# Patient Record
Sex: Male | Born: 1935
Health system: Southern US, Community
[De-identification: ages and names within clinical notes are randomized; demographics above are authoritative.]

## PROBLEM LIST (undated history)

## (undated) DIAGNOSIS — S065XAA Traumatic subdural hemorrhage with loss of consciousness status unknown, initial encounter: Secondary | ICD-10-CM

## (undated) DIAGNOSIS — I272 Pulmonary hypertension, unspecified: Secondary | ICD-10-CM

## (undated) DIAGNOSIS — E119 Type 2 diabetes mellitus without complications: Secondary | ICD-10-CM

## (undated) DIAGNOSIS — I2699 Other pulmonary embolism without acute cor pulmonale: Secondary | ICD-10-CM

## (undated) DIAGNOSIS — I2510 Atherosclerotic heart disease of native coronary artery without angina pectoris: Secondary | ICD-10-CM

## (undated) DIAGNOSIS — I639 Cerebral infarction, unspecified: Secondary | ICD-10-CM

## (undated) DIAGNOSIS — S065X9A Traumatic subdural hemorrhage with loss of consciousness of unspecified duration, initial encounter: Secondary | ICD-10-CM

## (undated) DIAGNOSIS — G4733 Obstructive sleep apnea (adult) (pediatric): Secondary | ICD-10-CM

## (undated) DIAGNOSIS — Z950 Presence of cardiac pacemaker: Secondary | ICD-10-CM

## (undated) DIAGNOSIS — G9001 Carotid sinus syncope: Secondary | ICD-10-CM

## (undated) DIAGNOSIS — I10 Essential (primary) hypertension: Secondary | ICD-10-CM

## (undated) DIAGNOSIS — E782 Mixed hyperlipidemia: Secondary | ICD-10-CM

## (undated) HISTORY — DX: Atherosclerotic heart disease of native coronary artery without angina pectoris: I25.10

## (undated) HISTORY — DX: Traumatic subdural hemorrhage with loss of consciousness of unspecified duration, initial encounter: S06.5X9A

## (undated) HISTORY — DX: Pulmonary hypertension, unspecified: I27.20

## (undated) HISTORY — DX: Essential (primary) hypertension: I10

## (undated) HISTORY — DX: Type 2 diabetes mellitus without complications: E11.9

## (undated) HISTORY — DX: Other pulmonary embolism without acute cor pulmonale: I26.99

## (undated) HISTORY — DX: Traumatic subdural hemorrhage with loss of consciousness status unknown, initial encounter: S06.5XAA

## (undated) HISTORY — DX: Carotid sinus syncope: G90.01

## (undated) HISTORY — DX: Obstructive sleep apnea (adult) (pediatric): G47.33

## (undated) HISTORY — PX: OTHER SURGICAL HISTORY: SHX169

## (undated) HISTORY — DX: Mixed hyperlipidemia: E78.2

---

## 1984-11-19 DIAGNOSIS — Z950 Presence of cardiac pacemaker: Secondary | ICD-10-CM

## 1984-11-19 HISTORY — DX: Presence of cardiac pacemaker: Z95.0

## 2001-04-29 ENCOUNTER — Ambulatory Visit (HOSPITAL_COMMUNITY): Admission: RE | Admit: 2001-04-29 | Discharge: 2001-04-29 | Payer: Self-pay | Admitting: Family Medicine

## 2001-04-29 ENCOUNTER — Encounter: Payer: Self-pay | Admitting: Family Medicine

## 2001-05-23 ENCOUNTER — Ambulatory Visit (HOSPITAL_COMMUNITY): Admission: RE | Admit: 2001-05-23 | Discharge: 2001-05-23 | Payer: Self-pay | Admitting: Family Medicine

## 2003-07-11 ENCOUNTER — Emergency Department (HOSPITAL_COMMUNITY): Admission: EM | Admit: 2003-07-11 | Discharge: 2003-07-11 | Payer: Self-pay | Admitting: *Deleted

## 2003-07-11 ENCOUNTER — Encounter: Payer: Self-pay | Admitting: *Deleted

## 2003-07-19 ENCOUNTER — Ambulatory Visit (HOSPITAL_COMMUNITY): Admission: RE | Admit: 2003-07-19 | Discharge: 2003-07-19 | Payer: Self-pay | Admitting: Internal Medicine

## 2003-07-20 ENCOUNTER — Ambulatory Visit (HOSPITAL_COMMUNITY): Admission: RE | Admit: 2003-07-20 | Discharge: 2003-07-20 | Payer: Self-pay | Admitting: Internal Medicine

## 2003-07-20 ENCOUNTER — Encounter: Payer: Self-pay | Admitting: Internal Medicine

## 2003-08-17 ENCOUNTER — Ambulatory Visit (HOSPITAL_COMMUNITY): Admission: RE | Admit: 2003-08-17 | Discharge: 2003-08-17 | Payer: Self-pay | Admitting: Internal Medicine

## 2004-04-30 ENCOUNTER — Emergency Department (HOSPITAL_COMMUNITY): Admission: EM | Admit: 2004-04-30 | Discharge: 2004-05-01 | Payer: Self-pay | Admitting: Emergency Medicine

## 2005-03-30 ENCOUNTER — Ambulatory Visit: Payer: Self-pay | Admitting: Cardiology

## 2005-03-30 ENCOUNTER — Inpatient Hospital Stay (HOSPITAL_COMMUNITY): Admission: AD | Admit: 2005-03-30 | Discharge: 2005-04-03 | Payer: Self-pay | Admitting: Internal Medicine

## 2005-04-03 ENCOUNTER — Ambulatory Visit: Payer: Self-pay | Admitting: Cardiology

## 2005-04-18 ENCOUNTER — Ambulatory Visit: Payer: Self-pay | Admitting: Cardiology

## 2005-06-26 ENCOUNTER — Ambulatory Visit: Payer: Self-pay | Admitting: Cardiology

## 2005-06-27 ENCOUNTER — Inpatient Hospital Stay (HOSPITAL_COMMUNITY): Admission: AD | Admit: 2005-06-27 | Discharge: 2005-06-28 | Payer: Self-pay | Admitting: Cardiology

## 2005-06-28 ENCOUNTER — Ambulatory Visit: Payer: Self-pay | Admitting: Cardiology

## 2005-07-11 ENCOUNTER — Ambulatory Visit: Payer: Self-pay | Admitting: Cardiology

## 2005-07-11 ENCOUNTER — Ambulatory Visit: Payer: Self-pay | Admitting: Internal Medicine

## 2005-07-16 ENCOUNTER — Inpatient Hospital Stay (HOSPITAL_COMMUNITY): Admission: RE | Admit: 2005-07-16 | Discharge: 2005-07-17 | Payer: Self-pay | Admitting: Internal Medicine

## 2005-07-16 HISTORY — PX: OTHER SURGICAL HISTORY: SHX169

## 2005-07-25 ENCOUNTER — Ambulatory Visit: Payer: Self-pay

## 2005-08-11 ENCOUNTER — Emergency Department (HOSPITAL_COMMUNITY): Admission: EM | Admit: 2005-08-11 | Discharge: 2005-08-12 | Payer: Self-pay | Admitting: *Deleted

## 2005-08-15 ENCOUNTER — Ambulatory Visit: Payer: Self-pay | Admitting: Cardiology

## 2005-08-17 ENCOUNTER — Ambulatory Visit: Payer: Self-pay | Admitting: Cardiology

## 2005-09-06 ENCOUNTER — Ambulatory Visit: Payer: Self-pay | Admitting: Cardiology

## 2005-09-25 ENCOUNTER — Ambulatory Visit: Payer: Self-pay | Admitting: Internal Medicine

## 2005-12-04 ENCOUNTER — Ambulatory Visit: Payer: Self-pay | Admitting: Cardiology

## 2006-01-09 ENCOUNTER — Ambulatory Visit: Payer: Self-pay | Admitting: Cardiology

## 2006-04-17 ENCOUNTER — Ambulatory Visit: Payer: Self-pay | Admitting: Cardiology

## 2006-09-26 ENCOUNTER — Ambulatory Visit: Payer: Self-pay | Admitting: Cardiology

## 2006-10-29 ENCOUNTER — Ambulatory Visit: Payer: Self-pay | Admitting: Internal Medicine

## 2007-04-22 ENCOUNTER — Ambulatory Visit: Payer: Self-pay | Admitting: Internal Medicine

## 2007-07-09 ENCOUNTER — Ambulatory Visit: Payer: Self-pay | Admitting: Cardiology

## 2007-07-22 ENCOUNTER — Ambulatory Visit: Payer: Self-pay | Admitting: Internal Medicine

## 2007-10-22 ENCOUNTER — Ambulatory Visit: Payer: Self-pay | Admitting: Internal Medicine

## 2008-01-21 ENCOUNTER — Ambulatory Visit: Payer: Self-pay | Admitting: Internal Medicine

## 2008-03-26 ENCOUNTER — Ambulatory Visit: Payer: Self-pay | Admitting: Internal Medicine

## 2008-06-29 ENCOUNTER — Ambulatory Visit: Payer: Self-pay | Admitting: Cardiology

## 2008-06-30 ENCOUNTER — Encounter: Payer: Self-pay | Admitting: Cardiology

## 2008-07-16 ENCOUNTER — Encounter: Payer: Self-pay | Admitting: Physician Assistant

## 2008-07-16 ENCOUNTER — Ambulatory Visit: Payer: Self-pay | Admitting: Cardiology

## 2008-07-18 ENCOUNTER — Encounter: Payer: Self-pay | Admitting: Cardiology

## 2008-07-19 ENCOUNTER — Encounter: Payer: Self-pay | Admitting: Cardiology

## 2008-07-20 ENCOUNTER — Ambulatory Visit: Payer: Self-pay | Admitting: Cardiology

## 2008-07-21 ENCOUNTER — Encounter: Payer: Self-pay | Admitting: Cardiology

## 2008-07-30 ENCOUNTER — Ambulatory Visit: Payer: Self-pay | Admitting: Internal Medicine

## 2008-08-18 ENCOUNTER — Ambulatory Visit: Payer: Self-pay | Admitting: Cardiology

## 2008-10-28 ENCOUNTER — Ambulatory Visit: Payer: Self-pay | Admitting: Internal Medicine

## 2009-01-26 ENCOUNTER — Ambulatory Visit: Payer: Self-pay | Admitting: Internal Medicine

## 2009-03-12 ENCOUNTER — Encounter (INDEPENDENT_AMBULATORY_CARE_PROVIDER_SITE_OTHER): Payer: Self-pay | Admitting: *Deleted

## 2009-03-31 ENCOUNTER — Ambulatory Visit: Payer: Self-pay | Admitting: Cardiology

## 2009-05-13 ENCOUNTER — Encounter: Payer: Self-pay | Admitting: Physician Assistant

## 2009-05-13 ENCOUNTER — Ambulatory Visit: Payer: Self-pay | Admitting: Internal Medicine

## 2009-05-13 ENCOUNTER — Inpatient Hospital Stay (HOSPITAL_COMMUNITY): Admission: AD | Admit: 2009-05-13 | Discharge: 2009-05-16 | Payer: Self-pay | Admitting: Cardiology

## 2009-05-13 ENCOUNTER — Encounter: Payer: Self-pay | Admitting: Internal Medicine

## 2009-05-14 ENCOUNTER — Encounter: Payer: Self-pay | Admitting: Cardiology

## 2009-05-15 ENCOUNTER — Encounter: Payer: Self-pay | Admitting: Cardiology

## 2009-05-16 ENCOUNTER — Encounter: Payer: Self-pay | Admitting: Cardiology

## 2009-06-01 ENCOUNTER — Ambulatory Visit: Payer: Self-pay | Admitting: Cardiology

## 2009-06-01 ENCOUNTER — Encounter: Payer: Self-pay | Admitting: Physician Assistant

## 2009-06-14 ENCOUNTER — Telehealth (INDEPENDENT_AMBULATORY_CARE_PROVIDER_SITE_OTHER): Payer: Self-pay | Admitting: Radiology

## 2009-06-15 ENCOUNTER — Encounter: Payer: Self-pay | Admitting: Cardiology

## 2009-06-15 ENCOUNTER — Ambulatory Visit: Payer: Self-pay

## 2009-06-15 ENCOUNTER — Encounter: Payer: Self-pay | Admitting: Cardiovascular Disease

## 2009-06-15 DIAGNOSIS — I700 Atherosclerosis of aorta: Secondary | ICD-10-CM | POA: Insufficient documentation

## 2009-07-04 DIAGNOSIS — I714 Abdominal aortic aneurysm, without rupture, unspecified: Secondary | ICD-10-CM | POA: Insufficient documentation

## 2009-07-04 DIAGNOSIS — I10 Essential (primary) hypertension: Secondary | ICD-10-CM | POA: Insufficient documentation

## 2009-07-04 DIAGNOSIS — R079 Chest pain, unspecified: Secondary | ICD-10-CM | POA: Insufficient documentation

## 2009-07-04 DIAGNOSIS — G9001 Carotid sinus syncope: Secondary | ICD-10-CM | POA: Insufficient documentation

## 2009-07-04 DIAGNOSIS — R0602 Shortness of breath: Secondary | ICD-10-CM | POA: Insufficient documentation

## 2009-07-04 DIAGNOSIS — E785 Hyperlipidemia, unspecified: Secondary | ICD-10-CM | POA: Insufficient documentation

## 2009-07-05 ENCOUNTER — Ambulatory Visit: Payer: Self-pay | Admitting: Cardiology

## 2009-07-05 ENCOUNTER — Encounter: Payer: Self-pay | Admitting: Cardiology

## 2009-07-05 DIAGNOSIS — M549 Dorsalgia, unspecified: Secondary | ICD-10-CM | POA: Insufficient documentation

## 2009-07-05 DIAGNOSIS — G2581 Restless legs syndrome: Secondary | ICD-10-CM | POA: Insufficient documentation

## 2009-07-05 DIAGNOSIS — G4733 Obstructive sleep apnea (adult) (pediatric): Secondary | ICD-10-CM | POA: Insufficient documentation

## 2009-07-12 ENCOUNTER — Telehealth: Payer: Self-pay | Admitting: Cardiology

## 2009-07-14 ENCOUNTER — Encounter: Payer: Self-pay | Admitting: Cardiology

## 2009-08-10 ENCOUNTER — Ambulatory Visit: Payer: Self-pay | Admitting: Internal Medicine

## 2009-09-08 ENCOUNTER — Encounter: Payer: Self-pay | Admitting: Internal Medicine

## 2009-11-14 ENCOUNTER — Encounter: Payer: Self-pay | Admitting: Internal Medicine

## 2009-11-15 ENCOUNTER — Ambulatory Visit: Payer: Self-pay | Admitting: Internal Medicine

## 2009-11-19 HISTORY — PX: CHOLECYSTECTOMY: SHX55

## 2009-11-24 ENCOUNTER — Encounter: Payer: Self-pay | Admitting: Internal Medicine

## 2010-01-06 ENCOUNTER — Encounter: Payer: Self-pay | Admitting: Cardiology

## 2010-01-08 ENCOUNTER — Encounter: Payer: Self-pay | Admitting: Cardiology

## 2010-01-09 ENCOUNTER — Encounter: Payer: Self-pay | Admitting: Cardiology

## 2010-01-10 ENCOUNTER — Encounter: Payer: Self-pay | Admitting: Cardiology

## 2010-01-12 ENCOUNTER — Encounter: Payer: Self-pay | Admitting: Cardiology

## 2010-01-24 ENCOUNTER — Ambulatory Visit: Payer: Self-pay | Admitting: Cardiology

## 2010-01-24 DIAGNOSIS — I2510 Atherosclerotic heart disease of native coronary artery without angina pectoris: Secondary | ICD-10-CM | POA: Insufficient documentation

## 2010-02-15 ENCOUNTER — Ambulatory Visit: Payer: Self-pay | Admitting: Internal Medicine

## 2010-02-20 ENCOUNTER — Encounter: Payer: Self-pay | Admitting: Internal Medicine

## 2010-05-23 ENCOUNTER — Ambulatory Visit: Payer: Self-pay | Admitting: Internal Medicine

## 2010-07-12 ENCOUNTER — Telehealth: Payer: Self-pay | Admitting: Internal Medicine

## 2010-08-24 ENCOUNTER — Ambulatory Visit: Payer: Self-pay | Admitting: Internal Medicine

## 2010-09-08 ENCOUNTER — Ambulatory Visit: Payer: Self-pay | Admitting: Cardiology

## 2010-09-08 DIAGNOSIS — H8110 Benign paroxysmal vertigo, unspecified ear: Secondary | ICD-10-CM | POA: Insufficient documentation

## 2010-09-20 ENCOUNTER — Encounter: Payer: Self-pay | Admitting: Cardiology

## 2010-11-30 ENCOUNTER — Encounter: Payer: Self-pay | Admitting: Internal Medicine

## 2010-11-30 ENCOUNTER — Ambulatory Visit
Admission: RE | Admit: 2010-11-30 | Discharge: 2010-11-30 | Payer: Self-pay | Source: Home / Self Care | Attending: Internal Medicine | Admitting: Internal Medicine

## 2010-12-21 NOTE — Miscellaneous (Signed)
Summary: Rehab Report/ FAXED OUTPATIENT REHAB  Rehab Report/ FAXED OUTPATIENT REHAB   Imported By: Dorise Hiss 10/03/2010 12:12:29  _____________________________________________________________________  External Attachment:    Type:   Image     Comment:   External Document

## 2010-12-21 NOTE — Assessment & Plan Note (Signed)
Summary: 6 MO FU PER FEB REMINDER-SRS   Visit Type:  Follow-up Primary Provider:  Hasanaj  CC:  follow-up visit.  History of Present Illness: the patient is a 76 year old male with a history of coronary artery disease. The patient has a patent circumflex stent and otherwise nonobstructive coronary disease and recent catheterization. He has normal LV function he also has normal intracardiac pressures arguing against diastolic heart failure. The patient has chronic dyspnea secondary to deconditioning and weight gain. The patient also sees her sick sinus syndrome status post pacemaker implantation. Has no recurrent syncope.  He was recently hospitalized with an ileus. This eventually improved with Reglan. He currently reports no constipation.  Patient denies any chest pain orthopnea PND he has no palpitations or syncope.  The patient has been screened in the past and I will ultrasound and has no evidence of abdominal aortic aneurysm.  Clinical Review Panels:  CXR CXR results Cardiomediastinal silhouette is stable.  Dual lead         cardiac pacemaker in place again noted.  No acute infiltrate or         edema.  No pleural effusion.  Degenerative changes of the thoracic         spine.  Mild hyperinflation again noted.                   IMPRESSION:         No significant change.  Dual lead cardiac pacemaker in place.  Mild         hyperinflation.  No acute disease. (07/16/2008)  Carotid Studies Carotid Doppler Results No significant atherosclerotic plaque in the carotid vessels.         There is no significant stenosis. (06/30/2008)  Cardiac Imaging Cardiac Cath Findings  1. Coronary artery disease with patent left circumflex stent and       nonobstructive coronary artery disease elsewhere.   2. Normal left ventricular function.      PLAN/DISCUSSION:  Based on his catheterization, his chest pain seems   noncardiac.  If it persist, I would consider a possible GI workup as an   outpatient.  He does have a moderate stenosis in the mid LAD, though I   think this is nonobstructive.  However, with persistent chest pain may   consider a Myoview to further evaluate.      Of note on panning down of his abdominal aorta after the left   ventriculogram, did not appear to have some plaquing down there and a   possible small abdominal aortic aneurysm and may consider a possible CT   scan of the abdomen as an outpatient to further evaluate as necessary by   Dr. Andee Lineman.   Bevelyn Buckles. Bensimhon, MD  (05/16/2009)    Preventive Screening-Counseling & Management  Alcohol-Tobacco     Smoking Status: never  Current Medications (verified): 1)  Pantoprazole Sodium 40 Mg Tbec (Pantoprazole Sodium) .... Once Daily 2)  Allopurinol 300 Mg Tabs (Allopurinol) .... Take 1 Tablet By Mouth Once A Day 3)  Lasix 20 Mg Tabs (Furosemide) .... Once Daily As Needed 4)  Aspirin 81 Mg Tbec (Aspirin) .... Take One Tablet By Mouth Daily 5)  Metoprolol Tartrate 50 Mg Tabs (Metoprolol Tartrate) .... Take One Tablet By Mouth Once A Day 6)  Metoclopramide Hcl 5 Mg Tabs (Metoclopramide Hcl) .... Take One By Mouth Before Meals  Allergies (verified): 1)  ! Nitroglycerin  Comments:  Nurse/Medical Assistant: The patient's medications and allergies were reviewed  with the patient and were updated in the Medication and Allergy Lists. List reviewed.  Past History:  Family History: Last updated: 07/05/2009 noncontributory  Social History: Last updated: 07/05/2009 patient denies tobacco use  Risk Factors: Smoking Status: never (01/24/2010)  Past Medical History: CAROTID SINUS SYNDROME (ICD-337.01) HYPERLIPIDEMIA-MIXED (ICD-272.4) HYPERTENSION, UNSPECIFIED (ICD-401.9) DYSPNEA (ICD-786.05) CHEST PAIN-UNSPECIFIED (ICD-786.50) ATHEROSCLEROSIS OF AORTA (ICD-440.0) Type 2 diabetes mellitus.  Obstructive sleep apnea  Social History: Smoking Status:  never  Review of Systems       The patient  complains of dizziness.  The patient denies fatigue, malaise, fever, weight gain/loss, vision loss, decreased hearing, hoarseness, chest pain, palpitations, shortness of breath, prolonged cough, wheezing, sleep apnea, coughing up blood, abdominal pain, blood in stool, nausea, vomiting, diarrhea, heartburn, incontinence, blood in urine, muscle weakness, joint pain, leg swelling, rash, skin lesions, headache, fainting, depression, anxiety, enlarged lymph nodes, easy bruising or bleeding, and environmental allergies.    Vital Signs:  Patient profile:   76 year old male Height:      73 inches Weight:      284 pounds O2 Sat:      96 % Pulse rate:   83 / minute BP sitting:   114 / 76  (left arm) Cuff size:   large  Vitals Entered By: Carlye Grippe (January 24, 2010 9:24 AM) CC: follow-up visit   Physical Exam  Additional Exam:  General: Well-developed, well-nourished in no distress head: Normocephalic and atraumatic eyes PERRLA/EOMI intact, conjunctiva and lids normal nose: No deformity or lesions mouth normal dentition, normal posterior pharynx neck: Supple, no JVD.  No masses, thyromegaly or abnormal cervical nodes lungs: Normal breath sounds bilaterally without wheezing.  Normal percussion heart: regular rate and rhythm with normal S1 and S2, no S3 or S4.  PMI is normal.  No pathological murmurs abdomen: Normal bowel sounds, abdomen is soft and nontender without masses, organomegaly or hernias noted.  No hepatosplenomegaly musculoskeletal: Back normal, normal gait muscle strength and tone normal pulsus: Pulse is normal in all 4 extremities Extremities: No peripheral pitting edema neurologic: Alert and oriented x 3 skin: Intact without lesions or rashes cervical nodes: No significant adenopathy psychologic: Normal affect    PPM Specifications Following MD:  Sherryl Manges, MD     PPM Vendor:  [employer]     PPM Model Number:  [id number]     PPM Serial Number:  [id number] PPM DOI:   07/16/2005     PPM Implanting MD:  Sherryl Manges, MD  Lead 1    Location: [facility]     DOI: 07/16/2005     Model #: [id number]     Serial #: [id number]     Status: active Lead 2    Location: [facility]     DOI: 07/16/2005     Model #: [id number]     Serial #: [id number]     Status: active   Indications:  Syncope    PPM Follow Up Pacer Dependent:  No      Parameters Mode:  DDD     Lower Rate Limit:  60     Upper Rate Limit:  130 Paced AV Delay:  310     Sensed AV Delay:  200  Impression & Recommendations:  Problem # 1:  ABDOMINAL AORTIC ANEURYSM (ICD-441.4) the patient has been ruled out her abdominal aortic aneurysm.  Problem # 2:  CAROTID SINUS SYNDROME (ICD-337.01) the patient has no recurrent syncope. He status post pacemaker implantation. The following medications were removed from  the medication list:    Nitroglycerin 0.4 Mg Subl (Nitroglycerin) ..... One tablet under tongue every 5 minutes as needed for chest pain---may repeat times three His updated medication list for this problem includes:    Aspirin 81 Mg Tbec (Aspirin) .Marland Kitchen... Take one tablet by mouth daily    Metoprolol Tartrate 50 Mg Tabs (Metoprolol tartrate) .Marland Kitchen... Take one tablet by mouth once a day  Problem # 3:  HYPERLIPIDEMIA-MIXED (ICD-272.4) Assessment: Comment Only  Problem # 4:  SLEEP APNEA, OBSTRUCTIVE (ICD-327.23) Assessment: Comment Only  Problem # 5:  CORONARY ARTERY DISEASE, S/P PTCA (ICD-414.9) the patient is status post prior stent placement. He has no recurrent chest pain. Continue secondary prevention. The following medications were removed from the medication list:    Nitroglycerin 0.4 Mg Subl (Nitroglycerin) ..... One tablet under tongue every 5 minutes as needed for chest pain---may repeat times three His updated medication list for this problem includes:    Aspirin 81 Mg Tbec (Aspirin) .Marland Kitchen... Take one tablet by mouth daily    Metoprolol Tartrate 50 Mg Tabs (Metoprolol tartrate) .Marland Kitchen... Take one tablet by mouth once a  day  Other Orders: EKG w/ Interpretation (93000)  Patient Instructions: 1)  Your physician recommends that you continue on your current medications as directed. Please refer to the Current Medication list given to you today. 2)  Follow up in  6 months.

## 2010-12-21 NOTE — Assessment & Plan Note (Signed)
Summary: 6 mo fu per sept reminder   Visit Type:  Follow-up Primary Jamyria Ozanich:  Hasanaj   History of Present Illness: the patient is a 76 year old male with a history of coronary artery disease. The patient has a patent circumflex stent and otherwise nonobstructive coronary disease and recent catheterization. He has normal LV function he also has normal intracardiac pressures arguing against diastolic heart failure. The patient has chronic dyspnea secondary to deconditioning and weight gain. The patient also has r sick sinus syndrome status post pacemaker implantation.  Patient complains off marked dizziness particularly vertigo. He states when he is standing and he moves  his head backwards, the room is spinning around him. Also has noticed when turning over in bed sometimes the room is spinning also. Sometimes even just turning his head to one side will cause a sensation of vertigo. He denies however any presyncope or syncope.    Preventive Screening-Counseling & Management  Alcohol-Tobacco     Smoking Status: never  Current Medications (verified): 1)  Prilosec 40 Mg Cpdr (Omeprazole) .... Take One Capsule Daily 2)  Allopurinol 300 Mg Tabs (Allopurinol) .... Take 1 Tablet By Mouth Once A Day 3)  Lasix 20 Mg Tabs (Furosemide) .... Once Daily As Needed 4)  Aspirin 81 Mg Tbec (Aspirin) .... Take One Tablet By Mouth Daily 5)  Metoprolol Tartrate 50 Mg Tabs (Metoprolol Tartrate) .... Take One Tablet By Mouth Once A Day 6)  Onglyza 5 Mg Tabs (Saxagliptin Hcl) .... Take 1 Tablet By Mouth Once A Day 7)  Probenecid 500 Mg Tabs (Probenecid) .... Take One Tablet Two Times A Day 8)  Fish Oil 1000 Mg Caps (Omega-3 Fatty Acids) .... Take 3 Capsules Daily 9)  Tricor 145 Mg Tabs (Fenofibrate) .... Take 1 Tablet By Mouth Once A Day 10)  Meclizine Hcl 25 Mg Tabs (Meclizine Hcl) .... Take 1 Tablet By Mouth Three Times A Day As Needed Dizziness/vertigo  Allergies (verified): 1)  !  Nitroglycerin  Comments:  Nurse/Medical Assistant: The patient's medication list and allergies were reviewed with the patient and were updated in the Medication and Allergy Lists.  Past History:  Past Medical History: Last updated: 01/24/2010 CAROTID SINUS SYNDROME (ICD-337.01) HYPERLIPIDEMIA-MIXED (ICD-272.4) HYPERTENSION, UNSPECIFIED (ICD-401.9) DYSPNEA (ICD-786.05) CHEST PAIN-UNSPECIFIED (ICD-786.50) ATHEROSCLEROSIS OF AORTA (ICD-440.0) Type 2 diabetes mellitus.  Obstructive sleep apnea  Family History: Last updated: 07/05/2009 noncontributory  Social History: Last updated: 07/05/2009 patient denies tobacco use  Risk Factors: Smoking Status: never (09/08/2010)  Review of Systems       The patient complains of weight gain/loss, vision loss, and dizziness.  The patient denies fatigue, malaise, fever, decreased hearing, hoarseness, chest pain, palpitations, shortness of breath, prolonged cough, wheezing, sleep apnea, coughing up blood, abdominal pain, blood in stool, nausea, vomiting, diarrhea, heartburn, incontinence, blood in urine, muscle weakness, joint pain, leg swelling, rash, skin lesions, headache, fainting, depression, anxiety, enlarged lymph nodes, easy bruising or bleeding, and environmental allergies.    Vital Signs:  Patient profile:   76 year old male Height:      73 inches Weight:      284 pounds Pulse rate:   60 / minute BP sitting:   108 / 70  (left arm) Cuff size:   regular  Vitals Entered By: Carlye Grippe (September 08, 2010 10:50 AM)  Physical Exam  Additional Exam:  General: Well-developed, well-nourished in no distress head: Normocephalic and atraumatic eyes PERRLA/EOMI intact, conjunctiva and lids normal nose: No deformity or lesions mouth normal dentition, normal posterior  pharynx neck: Supple, no JVD.  No masses, thyromegaly or abnormal cervical nodes lungs: Normal breath sounds bilaterally without wheezing.  Normal percussion heart:  regular rate and rhythm with normal S1 and S2, no S3 or S4.  PMI is normal.  No pathological murmurs abdomen: Normal bowel sounds, abdomen is soft and nontender without masses, organomegaly or hernias noted.  No hepatosplenomegaly musculoskeletal: Back normal, normal gait muscle strength and tone normal pulsus: Pulse is normal in all 4 extremities Extremities: No peripheral pitting edema neurologic: Alert and oriented x 3, positive Hallpike Dix maneuver in the office. With rotary nystagmus skin: Intact without lesions or rashes cervical nodes: No significant adenopathy psychologic: Normal affect    PPM Specifications Following MD:  Sherryl Manges, MD     PPM Vendor:  [employer]     PPM Model Number:  [id number]     PPM Serial Number:  [id number] PPM DOI:  07/16/2005     PPM Implanting MD:  Sherryl Manges, MD  Lead 1    Location: [facility]     DOI: 07/16/2005     Model #: [id number]     Serial #: [id number]     Status: active Lead 2    Location: [facility]     DOI: 07/16/2005     Model #: [id number]     Serial #: [id number]     Status: active   Indications:  Syncope    PPM Follow Up Pacer Dependent:  No      Parameters Mode:  DDD     Lower Rate Limit:  60     Upper Rate Limit:  130 Paced AV Delay:  310     Sensed AV Delay:  200  Impression & Recommendations:  Problem # 1:  PACEMAKER DDD- MDT (ICD-V45.01) normal pacemaker function  Problem # 2:  CORONARY ARTERY DISEASE, S/P PTCA (ICD-414.9) no recurrent  chest pain. No indication for ischemia workup His updated medication list for this problem includes:    Aspirin 81 Mg Tbec (Aspirin) .Marland Kitchen... Take one tablet by mouth daily    Metoprolol Tartrate 50 Mg Tabs (Metoprolol tartrate) .Marland Kitchen... Take one tablet by mouth once a day  Problem # 3:  BENIGN POSITIONAL VERTIGO (ICD-386.11) the patient appears to have benign positional vertigo with a positive Hallpike Dix maneuver. The patient will be referred to Margretta Ditty to learn repositioningtechniques. I have prescribed in   the meanwhile p.r.n. meclozine  Patient Instructions: 1)  Referral to Margretta Ditty for BPV in [place] 2)  Meclizine 25mg  three times a day as needed dizziness/vertigo 3)  Follow up in  6 months Prescriptions: MECLIZINE HCL 25 MG TABS (MECLIZINE HCL) Take 1 tablet by mouth three times a day as needed dizziness/vertigo  #30 x 1   Entered by:   Hoover Brunette, LPN   Authorized by:   Lewayne Bunting, MD, [facility]   Signed by:   Hoover Brunette, LPN on 13/24/4010   Method used:   Electronically to        [facility]  [address] [address] [facility] (retail)       [address]       [place], [place]  [address]       Ph: [phone] or [phone]       Fax: [phone]   RxID:   [id number]

## 2010-12-21 NOTE — Assessment & Plan Note (Signed)
Summary: 1 year rov   Primary Provider:  Hasanaj  CC:  1 year follow up. pt reports weakness in his calves and that he is just not able to keep up like he used too.  Marland Kitchen  History of Present Illness:  [name] is seen in followup for a pacemaker implanted for what were thought to be neurally-mediated pauses.  He has had no recurrent syncope.  his history of coronary artery disease with prior patent circumflex stent and otherwise nonobstructive coronary disease at catheterization June 2010.Marland Kitchen He has normal LV function;  a month later he underwent Myoview scanning which was read as normal.  Over the last 3 months he has had problems with significant worsening of exercise tolerance. This is described as being associated with leg weakness and profound diaphoresis. It is relieved by rest. It is unassociated with dizziness. He tells me that he has had an evaluation of lower extremity perfusion.   Current Medications (verified): 1)  Prilosec 40 Mg Cpdr (Omeprazole) .... Take One Capsule Daily 2)  Allopurinol 300 Mg Tabs (Allopurinol) .... Take 1 Tablet By Mouth Once A Day 3)  Lasix 20 Mg Tabs (Furosemide) .... Once Daily As Needed 4)  Aspirin 81 Mg Tbec (Aspirin) .... Take One Tablet By Mouth Daily 5)  Metoprolol Tartrate 50 Mg Tabs (Metoprolol Tartrate) .... Take One Tablet By Mouth Once A Day 6)  Metoclopramide Hcl 5 Mg Tabs (Metoclopramide Hcl) .... Take One By Mouth Before Meals Up To 4 Times Daily 7)  Actos 30 Mg Tabs (Pioglitazone Hcl) .... Take One Tablet Once Daily 8)  Pantoprazole Sodium 40 Mg Tbec (Pantoprazole Sodium) .... Take One Capsule Once Daily 9)  Probenecid 500 Mg Tabs (Probenecid) .... Take One Tablet Two Times A Day 10)  Fish Oil 1000 Mg Caps (Omega-3 Fatty Acids) .... Take 3 Capsules Daily  Allergies (verified): 1)  ! Nitroglycerin  Past History:  Past Medical History: Last updated: 01/24/2010 CAROTID SINUS SYNDROME (ICD-337.01) HYPERLIPIDEMIA-MIXED  (ICD-272.4) HYPERTENSION, UNSPECIFIED (ICD-401.9) DYSPNEA (ICD-786.05) CHEST PAIN-UNSPECIFIED (ICD-786.50) ATHEROSCLEROSIS OF AORTA (ICD-440.0) Type 2 diabetes mellitus.  Obstructive sleep apnea  Family History: Last updated: 07/05/2009 noncontributory  Social History: Last updated: 07/05/2009 patient denies tobacco use  Vital Signs:  Patient profile:   76 year old male Height:      73 inches Weight:      281 pounds BMI:     37.21 Pulse rate:   80 / minute Pulse rhythm:   regular BP sitting:   110 / 68  (left arm) Cuff size:   large  Vitals Entered By: [name] (May 23, 2010 9:38 AM)  Physical Exam  General:  well-developed and quite obese Caucasian male in no acute distress Head:  normal HEENT Neck:  supple without thyromegaly Chest Wall:  without CVA tenderness Lungs:  clear to auscultation Heart:  regular rate and rhythm Abdomen:  protuberant and soft Pulses:  in tach distal pulses Extremities:  no clubbing cyanosis or edema Neurologic:  grossly normal   PPM Specifications Following MD:  Jermaine Manges, MD     PPM Vendor:  [employer]     PPM Model Number:  [id number]     PPM Serial Number:  [id number] PPM DOI:  07/16/2005     PPM Implanting MD:  Jermaine Manges, MD  Lead 1    Location: RA     DOI: 07/16/2005     Model #: 8315     Serial #: [id number]     Status:  active Lead 2    Location: RV     DOI: 07/16/2005     Model #: [id number]     Serial #: [id number]     Status: active   Indications:  Syncope    PPM Follow Up Remote Check?  No Battery Voltage:  2.77 V     Battery Est. Longevity:  6 YEARS     Pacer Dependent:  No       PPM Device Measurements Atrium  Amplitude: 2.8 mV, Impedance: 412 ohms, Threshold: 0.75 V at 0.4 msec Right Ventricle  Amplitude: 15.68 mV, Impedance: 476 ohms, Threshold: 1.0 V at 0.4 msec  Episodes MS Episodes:  0     Ventricular High Rate:  46     Atrial Pacing:  8.8%     Ventricular Pacing:  0.3%  Parameters Mode:  DDD      Lower Rate Limit:  60     Upper Rate Limit:  130 Paced AV Delay:  310     Sensed AV Delay:  200 Next Remote Date:  08/24/2010     Next Cardiology Appt Due:  05/21/2011 Tech Comments:  Normal device function.  VHR EGM collection turned on today.  Mode switch turned on.  No other changes made.  Pt does Carelink transmissions.  ROV 12 months SK. Gypsy Balsam RN BSN  May 23, 2010 10:12 AM   Impression & Recommendations:  Problem # 1:  CORONARY ARTERY DISEASE, S/P PTCA (ICD-414.9) the patient has concerning symptoms given the exertional associated diaphoresis and weakness. I wonder whether has not been progressive of coronary disease in the last year and will start with a Myoview scan. We'll have him follow up with Dr. GD following this test. His updated medication list for this problem includes:    Aspirin 81 Mg Tbec (Aspirin) .Marland Kitchen... Take one tablet by mouth daily    Metoprolol Tartrate 50 Mg Tabs (Metoprolol tartrate) .Marland Kitchen... Take one tablet by mouth once a day  Orders: Nuclear Stress Test (Nuc Stress Test)  Problem # 2:  CAROTID SINUS SYNDROME (ICD-337.01) the patient's rate dropped all rhythm has been activated. There is a number of high rate episodes without EGMs. this is then activated. This could represent ventricular tachycardia  is updated medication list for this problem includes:    Aspirin 81 Mg Tbec (Aspirin) .Marland Kitchen... Take one tablet by mouth daily    Metoprolol Tartrate 50 Mg Tabs (Metoprolol tartrate) .Marland Kitchen... Take one tablet by mouth once a day  Problem # 3:  ABDOMINAL AORTIC ANEURYSM (ICD-441.4) decent intact pulses  Problem # 4:  PACEMAKER DDD- MDT (ICD-V45.01) Device parameters and data were reviewed and mode switch was activated and EGM storage for ventricular high rates wasalso turned on     Orders: Nuclear Stress Test (Nuc Stress Test)  Patient Instructions: 1)  You are scheduled for a device check from home on August 24, 2010. You may send your transmission at any time  that day. If you have a wireless device, the transmission will be sent automatically. After your physician reviews your transmission, you will receive a postcard with your next transmission date. 2)  Your physician wants you to follow-up in: 12 MONTHS WITH DR Graciela Husbands.  You will receive a reminder letter in the mail two months in advance. If you don't receive a letter, please call our office to schedule the follow-up appointment. 3)  Your physician has requested that you have an adenosine myoview.  For further information please visit [email or web address]  Please follow instruction sheet, as given. 4)  Your physician has recommended you make the following change in your medication: STOP ONE OF YOUR PPI MEDICATIONS

## 2010-12-21 NOTE — Progress Notes (Signed)
Summary: Bruise over Pacemaker   Phone Note Call from Patient Call back at Home Phone [phone]   Summary of Call: Pt's wife left message on nurse's voicemail stating pt has a bruise that has come up over his pacemaker. He is concerned about this. Pt is seen by Dr. Graciela Husbands in [facility] office. Message will be sent to message nurse in [place] regarding this.  Initial call taken by: Cyril Loosen, RN, BSN,  July 12, 2010 2:05 PM  Additional Follow-up for Phone Call Additional follow up Details #1::        spoke w/pt's wife.  pt woke up wed am with bruise at pacer site.  Pt does not remember hitting area although he has been painting.  Pt's wife to send carelink transmission and to call if bruising gets worse. Vella Kohler  July 13, 2010 10:18 AM

## 2010-12-21 NOTE — Letter (Signed)
Summary: Remote Device Check  [facility], Main Office  [address]   [place], [place] [address]   Phone: [phone]  Fax: [phone]     November 24, 2009 MRN: [id number]   NEIKO TRIVEDI [address] [place], [place]  [address]   Dear Mr. Glassco,   Your remote transmission was recieved and reviewed by your physician.  All diagnostics were within normal limits for you.  __X___Your next transmission is scheduled for:     February 15, 2010.  Please transmit at any time this day.  If you have a wireless device your transmission will be sent automatically.     Sincerely,  Proofreader

## 2010-12-21 NOTE — Letter (Signed)
Summary: Remote Device Check  [facility], Main Office  [address]   [place], [place] [address]   Phone: [phone]  Fax: [phone]     February 20, 2010 MRN: [id number]   VARUN JOURDAN [address] [place], [place]  [address]   Dear Mr. Marshburn,   Your remote transmission was recieved and reviewed by your physician.  All diagnostics were within normal limits for you.    ___X___Your next office visit is scheduled for:  JUNE 2011. Dr Graciela Husbands is no longer seeing patients in our [place] office.  His partner, Dr Johney Frame, has appointment availability in [place].  If you would prefer to be seen in [place], please call [phone] to schedule an appointment with Dr Johney Frame.  If you would prefer to see Dr Graciela Husbands in [place], please call [phone].     Sincerely,  Proofreader

## 2010-12-21 NOTE — Letter (Signed)
Summary: [facility] D/C DR. Lia Hopping  [facility] D/C DR. XAJE HASANAJ   Imported By: Zachary George 01/24/2010 08:34:12  _____________________________________________________________________  External Attachment:    Type:   Image     Comment:   External Document

## 2010-12-21 NOTE — Cardiovascular Report (Signed)
Summary: Office Visit Remote   Office Visit Remote   Imported By: Roderic Ovens 10/06/2010 14:09:55  _____________________________________________________________________  External Attachment:    Type:   Image     Comment:   External Document

## 2010-12-21 NOTE — Miscellaneous (Signed)
Summary: Orders Update - BPV  Clinical Lists Changes  Orders: Added new Referral order of Misc. Referral (Misc. Ref) - Signed

## 2010-12-21 NOTE — Cardiovascular Report (Signed)
Summary: Office Visit Remote   Office Visit Remote   Imported By: Roderic Ovens 11/30/2009 15:00:45  _____________________________________________________________________  External Attachment:    Type:   Image     Comment:   External Document

## 2010-12-31 ENCOUNTER — Encounter (INDEPENDENT_AMBULATORY_CARE_PROVIDER_SITE_OTHER): Payer: Self-pay | Admitting: *Deleted

## 2011-01-01 ENCOUNTER — Inpatient Hospital Stay (HOSPITAL_COMMUNITY): Payer: Medicare Other

## 2011-01-01 ENCOUNTER — Encounter (HOSPITAL_COMMUNITY): Payer: Self-pay | Admitting: Radiology

## 2011-01-01 ENCOUNTER — Emergency Department (HOSPITAL_COMMUNITY): Payer: Medicare Other

## 2011-01-01 ENCOUNTER — Inpatient Hospital Stay (HOSPITAL_COMMUNITY)
Admission: EM | Admit: 2011-01-01 | Discharge: 2011-01-02 | DRG: 101 | Disposition: A | Discharge status: Home / Self Care | Payer: Medicare Other | Attending: Internal Medicine | Admitting: Internal Medicine

## 2011-01-01 DIAGNOSIS — R569 Unspecified convulsions: Principal | ICD-10-CM | POA: Diagnosis present

## 2011-01-01 DIAGNOSIS — Z951 Presence of aortocoronary bypass graft: Secondary | ICD-10-CM

## 2011-01-01 DIAGNOSIS — I2510 Atherosclerotic heart disease of native coronary artery without angina pectoris: Secondary | ICD-10-CM | POA: Diagnosis present

## 2011-01-01 LAB — BASIC METABOLIC PANEL
Chloride: 102 mEq/L (ref 96–112)
GFR calc non Af Amer: 43 mL/min — ABNORMAL LOW (ref >60)
Glucose, Bld: 156 mg/dL — ABNORMAL HIGH (ref 70–99)

## 2011-01-01 LAB — RAPID URINE DRUG SCREEN, HOSP PERFORMED
Barbiturates: POSITIVE — AB
Cocaine: NOT DETECTED
Opiates: NOT DETECTED
Tetrahydrocannabinol: NOT DETECTED

## 2011-01-01 LAB — URINALYSIS, ROUTINE W REFLEX MICROSCOPIC
Protein, ur: NEGATIVE mg/dL
Specific Gravity, Urine: 1.023 (ref 1.005–1.030)
Urine Glucose, Fasting: 250 mg/dL — AB
Urobilinogen, UA: 1 mg/dL (ref 0.0–1.0)
pH: 7 (ref 5.0–8.0)

## 2011-01-01 LAB — CARDIAC PANEL(CRET KIN+CKTOT+MB+TROPI)
Relative Index: 1.3 (ref 0.0–2.5)
Total CK: 154 U/L (ref 7–232)
Troponin I: <0.01 ng/mL (ref 0.00–0.06)

## 2011-01-01 LAB — CBC
Hemoglobin: 15.4 g/dL (ref 13.0–17.0)
MCHC: 35.7 g/dL (ref 30.0–36.0)
MCV: 92.5 fL (ref 78.0–100.0)
Platelets: 195 10*3/uL (ref 150–400)

## 2011-01-01 LAB — ETHANOL: Alcohol, Ethyl (B): <5 mg/dL (ref 0–10)

## 2011-01-01 LAB — GLUCOSE, CAPILLARY: Glucose-Capillary: 168 mg/dL — ABNORMAL HIGH (ref 70–99)

## 2011-01-02 ENCOUNTER — Inpatient Hospital Stay (HOSPITAL_COMMUNITY): Payer: Medicare Other

## 2011-01-02 LAB — GLUCOSE, CAPILLARY: Glucose-Capillary: 131 mg/dL — ABNORMAL HIGH (ref 70–99)

## 2011-01-02 LAB — COMPREHENSIVE METABOLIC PANEL
ALT: 29 U/L (ref 0–53)
Albumin: 3.5 g/dL (ref 3.5–5.2)
Alkaline Phosphatase: 37 U/L — ABNORMAL LOW (ref 39–117)
BUN: 18 mg/dL (ref 6–23)
Chloride: 101 mEq/L (ref 96–112)
Glucose, Bld: 149 mg/dL — ABNORMAL HIGH (ref 70–99)
Potassium: 3.7 mEq/L (ref 3.5–5.1)
Sodium: 138 mEq/L (ref 135–145)
Total Bilirubin: 1.1 mg/dL (ref 0.3–1.2)

## 2011-01-02 LAB — CBC
HCT: 41 % (ref 39.0–52.0)
MCH: 32.8 pg (ref 26.0–34.0)
MCV: 94 fL (ref 78.0–100.0)
Platelets: 178 10*3/uL (ref 150–400)
RDW: 13.8 % (ref 11.5–15.5)

## 2011-01-02 LAB — CARDIAC PANEL(CRET KIN+CKTOT+MB+TROPI)
CK, MB: 2.3 ng/mL (ref 0.3–4.0)
Relative Index: 1.1 (ref 0.0–2.5)
Relative Index: 1.2 (ref 0.0–2.5)
Total CK: 198 U/L (ref 7–232)

## 2011-01-02 MED ORDER — IOHEXOL 350 MG/ML SOLN
100.0000 mL | Freq: Once | INTRAVENOUS | Status: AC | PRN
  Administered 2011-01-02: 100 mL via INTRAVENOUS

## 2011-01-08 DIAGNOSIS — R55 Syncope and collapse: Secondary | ICD-10-CM

## 2011-01-10 NOTE — Cardiovascular Report (Signed)
Summary: Office Visit Remote   Office Visit Remote   Imported By: Roderic Ovens 01/02/2011 15:19:49  _____________________________________________________________________  External Attachment:    Type:   Image     Comment:   External Document

## 2011-01-10 NOTE — Letter (Signed)
Summary: Remote Device Check  [employer], Main Office  [address]   [place], [place] [address]   Phone: [phone]  Fax: [phone]     December 31, 2010 MRN: [id number]   EMMA SCHUPP [address] [place], [place]  [address]   Dear Mr. Gram,   Your remote transmission was recieved and reviewed by your physician.  All diagnostics were within normal limits for you.  __X___Your next transmission is scheduled for:  03-01-2011.  Please transmit at any time this day.  If you have a wireless device your transmission will be sent automatically.   Sincerely,  Vella Kohler

## 2011-01-10 NOTE — Discharge Summary (Signed)
[name], [name]                ACCOUNT NO.:  [id number]  MEDICAL RECORD NO.:  [id number]           PATIENT TYPE:  I  LOCATION:  3028                         FACILITY:  [facility]  PHYSICIAN:  Tarry Kos, MD       DATE OF BIRTH:  1934/11/23  DATE OF ADMISSION:  01/01/2011 DATE OF DISCHARGE:                              DISCHARGE SUMMARY   DISCHARGE DIAGNOSES: 1. Seizure, one episode. 2. History of coronary artery disease status post pacemaker placement.  HOSPITAL COURSE:  [name] is a 76 year old male who presented to the emergency room after suffering from a seizure that occurred once at the orthopedic surgeon's office and he was sent to the ED for further workup.  Neurology was consulted.  He had an EEG which showed no seizure activity.  An MRI could not be done because of his pacemaker.  His pacemaker was interrogated which did not show any evidence for his seizure activity.  Neurology recommended not to start any anticonvulsants because it was a one-time event and if he had any further unprovoked seizures to start him on anticonvulsants.  A CTA of his head/neck was done that final report is pending.  CT of his head was done which showed mild atrophy, white matter disease, but no acute intracranial abnormality.  His glucose levels was 156 on arrival, so he was not hypoglycemic at all.  His white count was normal.  His hemoglobin was normal.  His alcohol level was less than 5.  Serial cardiac enzymes were all negative.  Electrolytes normal.  Urine drug screen positive for barbiturates, otherwise negative.  LFTs normal.  BUN and creatinine normal.  PHYSICAL EXAMINATION:  VITAL SIGNS:  He was afebrile.  His vital signs have been stable.  O2 sats 97% on room air. GENERAL:  Alert and oriented x4.  No apparent distress.  Cooperative and friendly. COR:  Regular rate and rhythm without murmurs. CHEST:  Clear to auscultation bilaterally.  By wheeze, rhonchi, or rales. ABDOMEN:   Soft, nontender, nondistended.  Positive bowel sounds.  No hepatosplenomegaly. EXTREMITIES:  No clubbing, cyanosis, or edema. PSYCH:  Normal affect. NEURO:  No focal neurologic deficits. SKIN:  No rashes.  DISPOSITION:  The patient is being discharged home to follow up with his primary care physician in 1 week.  If he has any further seizures, he is to come to the emergency room at which point he will need to be placed on anticonvulsant treatments.  Again, the underlying etiology of why he had this brief seizure activity is unclear.  Neurology has recommended again not to start any anticonvulsants unless it happens again.  His workup here has been negative.  A CTA of his neck is pending, that final report will need to be checked upon with his primary care physician.  He will be discharged if that is normal.  He will be discharged on the same medications as prior to admission.  There have been no changes to his medication regimen.  Follow up with the primary care physician in 1 week.          ______________________________ Tarry Kos,  MD     RD/MEDQ  D:  01/02/2011  T:  01/03/2011  Job:  [id number]  Electronically Signed by Eldridge Dace MD on 01/10/2011 01:45:38 PM

## 2011-01-18 NOTE — Consult Note (Signed)
NAMEDONTARIUS, SHELEY                ACCOUNT NO.:  [id number]  MEDICAL RECORD NO.:  [id number]           PATIENT TYPE:  I  LOCATION:  3028                         FACILITY:  [facility]  PHYSICIAN:  Chesney Klimaszewski P. Pearlean Brownie, MD    DATE OF BIRTH:  1934-11-22  DATE OF CONSULTATION: DATE OF DISCHARGE:                                CONSULTATION   REFERRING PHYSICIAN:  Katherine Roan, MD  REASON FOR REFERRAL:  Seizure.  HISTORY OF PRESENT ILLNESS:  Mr. Stutz is a 76 year old Caucasian gentleman who is unable to provide history, which was obtained from his wife who is present at the bedside.  The patient had gone to Dr. Thomasena Edis, orthopedic surgeon's office, for routine visit when he apparently had a witnessed episode of generalized tonic-clonic seizure in the room.  The patient's wife describes the patient that all of a sudden becoming quiet, eyes rolling up, and arching his back and having generalized tonic-clonic activity lasting for a minute or so followed by a period of decreased responsiveness and then he has remained confused, disoriented since then, slow to respond to questions.  There is no tongue bite or incontinence noted.  He has no prior history of seizures, epilepsy, or febrile seizures.  No family history of epilepsy.  No history of stroke, TIA, or significant neurological problems in the past.  He has been complaining of some headache since then.  He has had a noncontrast CAT scan of the head done, which showed mild generalized atrophy and small vessel disease changes.  No acute abnormalities are seen.  PAST MEDICAL HISTORY:  Significant for gout, hypertension, osteoarthritis.  HOME MEDICATIONS:  Allopurinol, aspirin, furosemide, metoprolol, omeprazole, Onglyza, probenecid, TriCor.  REVIEW OF SYSTEMS:  No recent fever, cough, chest pain, diarrhea, or illness.  PHYSICAL EXAMINATION:  GENERAL:  A pleasant Caucasian middle-aged gentleman, currently not in distress. VITAL SIGNS:   Afebrile.  Temperature 98.6, blood pressure 144/106, pulse rate 74 per minute and regular, respiratory rate 20 per minute, oxygen sats 95% on room air. HEENT:  Head is nontraumatic.  Hearing is probably normal. NECK:  Supple.  There is no bruit. CARDIAC:  No murmurs or gallops. LUNGS:  Clear to auscultation. ABDOMEN:  Soft and nontender. NEUROLOGIC:  The patient is awake and alert.  He is disoriented to time, place, and person.  He is slow to respond to questions.  He speaks in nonfluent speech.  He is able to repeat short sentences.  He follows only simple one-step commands.  He has easy distractibility.  Eye movements are full range.  There is no nystagmus.  He blinks to threat bilaterally, but more on the left than the right.  There is no facial weakness.  Tongue is midline.  There is no upper or lower extremity drift.  He has symmetric and equal strength in all four extremities. Deep tendon reflexes are 2+ symmetric.  Plantars are downgoing. Coordination sensation cannot be reliably tested.  Gait was not tested.  DATA REVIEWED:  CT scan results as stated above, unremarkable except for age-related changes of small vessel disease and atrophy.  WBC count and electrolytes and  coagulation labs are normal.  Urine drug screen positive only for barbiturates.  UA negative.  IMPRESSION:  A 76-year gentleman with witnessed solitary generalized tonic-clonic seizure without any obvious provocating factor.  PLAN:  The patient will be admitted for observation.  I will repeat a CT scan with CT angio in the morning as we cannot do an MRI since he has a pacemaker.  Check EEG as well.  No need to start on anticonvulsants at the present time.  If he has second unprovoked seizures, we will start him on anticonvulsants.  I had a long discussion with the patient, his wife as well as her sister, and answered questions.  We will be happy to follow the patient in consult.  Kindly call for  questions.     Cecil Bixby P. Pearlean Brownie, MD     PPS/MEDQ  D:  01/01/2011  T:  01/02/2011  Job:  [id number]  Electronically Signed by Delia Heady MD on 01/18/2011 01:20:50 PM

## 2011-01-25 NOTE — H&P (Signed)
[name], [name] NO.:  [id number]  MEDICAL RECORD NO.:  [id number]           PATIENT TYPE:  E  LOCATION:  [facility]                         FACILITY:  [facility]  PHYSICIAN:  Baltazar Najjar, MD     DATE OF BIRTH:  07-14-1935  DATE OF ADMISSION:  01/01/2011 DATE OF DISCHARGE:                             HISTORY & PHYSICAL   PCP:  Dr. Lia Hopping in [place].  CODE STATUS:  The patient is a full code.  CHIEF COMPLAINT:  Witnessed tonic-clonic seizure.  HISTORY OF PRESENT ILLNESS:  History is obtained from wife and chart as the patient is currently confused and in postictal state.  In summary, [name] is a 76 year old man with multiple comorbidities as dictated below.  He was in his orthopedic doctor's office this morning for a followup visit for bilateral knee arthritis when he suddenly had a witnessed tonic-clonic seizure in his doctor's office.  The patient was brought into [facility] ED in postictal state.  His glucose level checked in his orthopedic office was 149 as per his wife and was normal here in the ER as well.  Glucose was above 100 in the ED.  His head CT did not show any finding of acute stroke, showed mild atrophy and chronic changes.  As per his wife, the patient was feeling normally, has been walking around, eating.  He has no complaint.  No fever.  No chills.  No abdominal pain or any change in his bowel habits.  No shortness of breath or cough.  He was in his usual state of health until this morning when he walked into his orthopedic appointment.  Also as per his wife, he has no history of seizures in the past and never been on seizure medication.  The patient was seen by Dr. Pearlean Brownie from [facility] in the ED, who recommended EEG, CTA of the brain,, and to hold off any anticonvulsant at this time.  PAST MEDICAL HISTORY: 1. History of coronary artery disease status post stent placement. 2. Hypertension. 3. Diabetes  type 2. 4. Dyslipidemia. 5. Obstructive sleep apnea, on CPAP. 6. Morbid obesity. 7. History of carotid sinus hypersensitivity/syncope status post     pacemaker in 2006. 8. History of orthostatic hypotension. 9. Chronic diastolic congestive heart failure. 10.Gastroesophageal reflux disease with history of esophageal     stricture and dilatation. 11.Restless legs syndrome. 12.Gout.  ALLERGIES:  HE IS ALLERGIC TO NITROGLYCERIN.  HOME MEDICATIONS: 1. TriCor 145 mg p.o. daily. 2. Lasix 20 mg daily. 3. Probenecid 500 mg 2 tablets daily. 4. Metoprolol 50 mg daily. 5. Omeprazole 40 mg daily. 6. Onglyza 5 mg p.o. daily. 7. Aspirin enteric-coated 81 mg p.o. daily. 8. Allopurinol 300 mg p.o. daily.  REVIEW OF SYSTEMS:  Unable to obtain, however, as per his wife, the patient was not complaining of any other symptoms.  FAMILY HISTORY:  Significant for diabetes and hypertension.  PHYSICAL EXAM:  VITAL SIGNS:  Blood pressure 139/88, pulse 75, respiratory rate 18, and O2 sat 98%. GENERAL:  The patient is alert, however, confused and complaining of headache at the time  of exam. NECK:  Supple. CARDIOVASCULAR:  S1 and S2, regular rhythm and rate. CHEST:  Clear to auscultation bilaterally. ABDOMEN:  Obese, soft, nontender.  Bowel sounds heard normally. EXTREMITIES:  No pedal edema. NEUROLOGIC:  Unable to pain.  The patient is not cooperative and in postictal state.  LABORATORY DATA:  Urine drug screen positive for barbiturate. Urinalysis unremarkable.  Troponin-I 0.01, CK-MB 2.8, potassium 3.9, sodium 139, glucose 156, BUN 20, creatinine 1.59, calcium 9.4.  Alcohol level less than 5.  WBC 6.8, hemoglobin 15.4, hematocrit 43.1, platelet count 195.  RADIOLOGY/IMAGING:  Head CT showed mild atrophy, white matter disease likely reflect the sequelae of chronic microvascular ischemia.  No acute intracranial abnormalities.  ASSESSMENT AND PLAN:  [name] is a 76 year old man with  the above medical history, no previous history of seizure, presented with tonic-clonic seizures. 1. Seizure, unknown etiology.  Blood glucose level within normal     range.  No evidence of infectious or metabolic causes at this time. The patient was seen by Dr. Pearlean Brownie from [facility], who recommended EEG, CTA of the brain, and to hold off any anticonvulsants at this time. The patient will be placed on seizure precautions. The patient will be kept n.p.o. and to obtain swallow screen. The patient will be observed on telemetry to rule out any arrhythmias. The patient had a history of pacemaker for carotid sinus hypersensitivity.  I will request Cardiology consult for interrogation of the pacemaker to see if there is any cardiac cause such as arrhythmias for his seizure. 1. Hypertension.  Continue home meds. 2. Diabetes.  Hold any oral hypoglycemic agents and will keep him on     insulin sliding scale and monitor his CBG very closely.  I will     write for q.4 h checks. 3. Obstructive sleep apnea.  Continue CPAP. 4. Prophylaxis with Protonix and heparin for DVT prophylaxis. 5. Code status.  The patient is full code.          ______________________________ Baltazar Najjar, MD     SA/MEDQ  D:  01/01/2011  T:  01/01/2011  Job:  [id number]  cc:   Lia Hopping Pramod P. Pearlean Brownie, MD  Electronically Signed by Hannah Beat MD on 01/24/2011 09:25:20 PM

## 2011-02-26 LAB — COMPREHENSIVE METABOLIC PANEL
AST: 32 U/L (ref 0–37)
Albumin: 3.9 g/dL (ref 3.5–5.2)
BUN: 19 mg/dL (ref 6–23)
Calcium: 9.1 mg/dL (ref 8.4–10.5)
Chloride: 102 mEq/L (ref 96–112)
Creatinine, Ser: 1.32 mg/dL (ref 0.4–1.5)
GFR calc Af Amer: >60 mL/min (ref >60)
Total Bilirubin: 1.2 mg/dL (ref 0.3–1.2)

## 2011-02-26 LAB — CBC
HCT: 42.7 % (ref 39.0–52.0)
HCT: 43.1 % (ref 39.0–52.0)
HCT: 43.7 % (ref 39.0–52.0)
Hemoglobin: 14.4 g/dL (ref 13.0–17.0)
Hemoglobin: 14.9 g/dL (ref 13.0–17.0)
MCHC: 34 g/dL (ref 30.0–36.0)
MCHC: 34.3 g/dL (ref 30.0–36.0)
MCHC: 34.5 g/dL (ref 30.0–36.0)
MCV: 98.4 fL (ref 78.0–100.0)
MCV: 98.5 fL (ref 78.0–100.0)
MCV: 99 fL (ref 78.0–100.0)
Platelets: 166 10*3/uL (ref 150–400)
Platelets: 168 10*3/uL (ref 150–400)
Platelets: 175 10*3/uL (ref 150–400)
Platelets: 185 10*3/uL (ref 150–400)
RDW: 13.6 % (ref 11.5–15.5)
RDW: 14 % (ref 11.5–15.5)
WBC: 5.3 10*3/uL (ref 4.0–10.5)
WBC: 5.6 10*3/uL (ref 4.0–10.5)

## 2011-02-26 LAB — CARDIAC PANEL(CRET KIN+CKTOT+MB+TROPI)
CK, MB: 2.1 ng/mL (ref 0.3–4.0)
Relative Index: 1.8 (ref 0.0–2.5)
Relative Index: 1.9 (ref 0.0–2.5)
Total CK: 118 U/L (ref 7–232)
Total CK: 122 U/L (ref 7–232)
Troponin I: 0.01 ng/mL (ref 0.00–0.06)
Troponin I: 0.02 ng/mL (ref 0.00–0.06)

## 2011-02-26 LAB — GLUCOSE, CAPILLARY
Glucose-Capillary: 101 mg/dL — ABNORMAL HIGH (ref 70–99)
Glucose-Capillary: 109 mg/dL — ABNORMAL HIGH (ref 70–99)
Glucose-Capillary: 113 mg/dL — ABNORMAL HIGH (ref 70–99)
Glucose-Capillary: 114 mg/dL — ABNORMAL HIGH (ref 70–99)
Glucose-Capillary: 118 mg/dL — ABNORMAL HIGH (ref 70–99)
Glucose-Capillary: 120 mg/dL — ABNORMAL HIGH (ref 70–99)
Glucose-Capillary: 120 mg/dL — ABNORMAL HIGH (ref 70–99)
Glucose-Capillary: 128 mg/dL — ABNORMAL HIGH (ref 70–99)
Glucose-Capillary: 174 mg/dL — ABNORMAL HIGH (ref 70–99)
Glucose-Capillary: 89 mg/dL (ref 70–99)

## 2011-02-26 LAB — DIFFERENTIAL
Basophils Relative: 1 % (ref 0–1)
Eosinophils Absolute: 0.1 10*3/uL (ref 0.0–0.7)
Neutro Abs: 3.1 10*3/uL (ref 1.7–7.7)
Neutrophils Relative %: 56 % (ref 43–77)

## 2011-02-26 LAB — LIPID PANEL
Cholesterol: 235 mg/dL — ABNORMAL HIGH (ref 0–200)
HDL: 38 mg/dL — ABNORMAL LOW (ref >39)
Total CHOL/HDL Ratio: 6.2 RATIO
Triglycerides: 129 mg/dL (ref <150)

## 2011-02-26 LAB — BASIC METABOLIC PANEL
BUN: 14 mg/dL (ref 6–23)
CO2: 25 mEq/L (ref 19–32)
Chloride: 103 mEq/L (ref 96–112)
GFR calc non Af Amer: >60 mL/min (ref >60)
Glucose, Bld: 101 mg/dL — ABNORMAL HIGH (ref 70–99)
Potassium: 3.8 mEq/L (ref 3.5–5.1)
Sodium: 138 mEq/L (ref 135–145)

## 2011-02-26 LAB — HEPARIN LEVEL (UNFRACTIONATED)
Heparin Unfractionated: 0.7 IU/mL (ref 0.30–0.70)
Heparin Unfractionated: 0.86 IU/mL — ABNORMAL HIGH (ref 0.30–0.70)
Heparin Unfractionated: 0.9 IU/mL — ABNORMAL HIGH (ref 0.30–0.70)

## 2011-02-26 LAB — PROTIME-INR: INR: 1 (ref 0.00–1.49)

## 2011-02-26 LAB — TSH: TSH: 6.316 u[IU]/mL — ABNORMAL HIGH (ref 0.350–4.500)

## 2011-02-26 LAB — APTT: aPTT: 27 seconds (ref 24–37)

## 2011-03-01 ENCOUNTER — Ambulatory Visit (INDEPENDENT_AMBULATORY_CARE_PROVIDER_SITE_OTHER): Payer: Medicare Other | Admitting: *Deleted

## 2011-03-01 ENCOUNTER — Other Ambulatory Visit: Payer: Self-pay

## 2011-03-01 DIAGNOSIS — I495 Sick sinus syndrome: Secondary | ICD-10-CM

## 2011-03-08 NOTE — Progress Notes (Signed)
Pacer remote 

## 2011-03-20 ENCOUNTER — Encounter: Payer: Self-pay | Admitting: *Deleted

## 2011-04-03 NOTE — Cardiovascular Report (Signed)
[facility]                   [facility] NOTE   JERICK, KHACHATRYAN                       MRN:          [id number]  DATE:03/26/2008                            DOB:          27-Feb-1935    Mr. Noteboom is seen in followup for a pacemaker implanted for what were  thought to be neurally-mediated pauses.  He has had no recurrent  syncope.  He also has some problems with edema, which is resolved with  low-dose furosemide.  His other medications include Januvia 10,  Lopressor 50 a day, Flomax 0.4, probenecid 50, Plavix 75, and  allopurinol.   On examination his blood pressure today was 135/92 with a pulse of 74.  His lungs were clear.  His heart sounds were regular.  The extremities were without edema.  He was in no acute distress.   Interrogation of his Medtronic pacemaker demonstrated a P-wave with an  impedance of 392 with a threshold of 0.75 at 0.4.  The R wave was 11  with an impedance of 49, a threshold of 1 volt at 0.4.  Battery voltage  was 2.78.   IMPRESSION:  1. Syncope thought to be neurally mediated with pauses.  2. Status post pacer for the above, with resolution.  3. Diabetes.  4. Edema, improved on furosemide.  5. Coronary artery disease, with prior Taxus stenting.   Mr. Forti is doing well at this point.  His issues are stable.  His  edema is borderline but I think we are going to have to accept a little  bit of edema to prevent any further problems with syncope.   The other issue is whether he needs to be continued on Plavix now 3  years status post drug-eluting stent.  I will defer this to the [facility]  cardiology team.     Duke Salvia, MD, [facility]  Electronically Signed    SCK/MedQ  DD: 03/26/2008  DT: 03/26/2008  Job #: [id number]

## 2011-04-03 NOTE — Assessment & Plan Note (Signed)
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  The patient is also status post pacemaker implantation by Dr.  Graciela Husbands.  The patient in the past has had significant problems with  positional dizziness, this is all, but resolved.  We had given him an  appointment Dr. Sandria Manly, but he states that he did not need the  appointment.  He states he is feeling great.  He has no chest pain,  shortness of breath, but he likes to lose some weight.  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   MEDICATIONS:  1. Pantoprazole 40 mg p.o. daily.  2. Allopurinol 300 mg p.o. daily.  3. Actos 30 mg p.o. daily.  4. Flomax 0.4 mg p.o. daily.  5. Lisinopril 20 mg p.o. daily.  6. Plavix 75 mg p.o. daily.  7. Metoprolol tartrate 50 mg p.o. daily.  8. Probenecid 500 mg p.o. b.i.d.   PHYSICAL EXAMINATION:  VITAL SIGNS:  Blood pressure 127/82, heart rate  68, and weight 292 pounds.  NECK:  Normal carotid upstroke and no carotid bruits.  LUNGS:  Clear breath sounds bilaterally.  HEART:  Regular rate and rhythm.  Normal S1 and S2.  No murmurs, rubs,  or gallops.  ABDOMEN:  Soft and nontender.  No rebound or guarding.  Good bowel  sounds.  EXTREMITIES:  No cyanosis, clubbing, or edema.  NEUROLOGIC:  The patient is alert, oriented and grossly nonfocal.   PROBLEM LIST:  1. Positional dizziness, resolved.  2. History of carotid sinus hypersensitivity status post pacemaker      implantation.  3. Coronary artery disease, stable.      a.     Status post TAXUS stenting of the circumflex May 2006.      b.     Status post status post re-look  catheterization of August       2006 with patent stent.      c.     Low-risk adenosine Cardiolite study, ejection fraction 54%       in 2009.  4. Normal left ventricular ejection fraction.  5. History of hypertension.  6. Dyslipidemia.  7. Type 2 diabetes mellitus.  8. Obstructive sleep apnea.  9. Morbid obesity.   PLAN:  1. The patient has no significant cardiac issues at present time that      needs to be addressed.  We will continue on his current medical      regimen.  2. The patient will see [place] back in 6 months.     [name], MD,FACC  Electronically Signed    GED/MedQ  DD: 03/31/2009  DT: 04/01/2009  Job #: [id number]   cc:   Lia Hopping

## 2011-04-03 NOTE — Cardiovascular Report (Signed)
[name], [name]                ACCOUNT NO.:  [id number]   MEDICAL RECORD NO.:  [id number]          PATIENT TYPE:  INP   LOCATION:  3731                         FACILITY:  [facility]   PHYSICIAN:  Bevelyn Buckles. Bensimhon, MDDATE OF BIRTH:  [birth year]   DATE OF PROCEDURE:  05/16/2009  DATE OF DISCHARGE:  05/16/2009                            CARDIAC CATHETERIZATION   PRIMARY CARDIOLOGIST:  [name], MD, [facility]   IDENTIFICATION:  [name] is a 76 year old male with history of  coronary artery disease, status post TAXUS drug-eluting stent to the  left posterolateral in May 2006.  He also has a history of diabetes,  obesity, obstructive sleep apnea.  For the past couple of months, he has  been having exertional chest pain which was worse over the weekend and  he was admitted.  He was ruled out for myocardial infarction with serial  cardiac markers, EKG was okay.  He was referred for diagnostic  angiography.   PROCEDURES PERFORMED:  1. Selective coronary angiography.  2. Left heart catheterization.  3. Left ventriculogram.   DESCRIPTION OF PROCEDURE:  The risks and indications were explained.  Consent was signed and placed on the chart.  A 5-French arterial sheath  was placed in the right femoral artery using a modified Seldinger  technique.  Standard catheters including JL-4, JR-4, and angled pigtail  were used for the catheterization.  All catheter exchanges were made  over the wire.  There were no apparent complications.   Central aortic pressure is 116/70 with a mean of 90.  LV pressure was  118/4 with an EDP of 10.  There was no aortic stenosis.   Left main was large vessel, angiographically normal.   LAD was a large vessel coursing to the apex.  It gave off a very large  first diagonal.  It had diffuse luminal irregularities throughout the  vessel.  Throughout the proximal portion, it was approximately 30%  stenotic and in the midsection, there was a 50-60% focal lesion  seen  best on the LAO cranial view.  This was likely an eccentric plaque.  There was a 30% lesion in the midportion of the first diagonal.   Left circumflex was a very large dominant system.  It was slightly  ectatic, gave off small-to-moderate-sized ramus with tiny OM-1, a large  first posterolateral, medium-sized second posterolateral, and a PDA.  There was a diffuse 20% lesion throughout the proximal AV groove circ  and then a 30-40% lesion in the mid AV groove circ after the takeoff of  the first posterolateral.  In the midportion of the ramus, there was a  40-50% lesion.  There was a stent in second posterolateral, which was  widely patent.   Right coronary, there was a small nondominant vessel with no high-grade  stenosis.   Left ventriculogram done in the RAO position showed an EF of 65%.  No  regional wall motion abnormalities.   ASSESSMENT:  1. Coronary artery disease with patent left circumflex stent and      nonobstructive coronary artery disease elsewhere.  2. Normal left ventricular function.  PLAN/DISCUSSION:  Based on his catheterization, his chest pain seems  noncardiac.  If it persist, I would consider a possible GI workup as an  outpatient.  He does have a moderate stenosis in the mid LAD, though I  think this is nonobstructive.  However, with persistent chest pain may  consider a Myoview to further evaluate.   Of note on panning down of his abdominal aorta after the left  ventriculogram, did not appear to have some plaquing down there and a  possible small abdominal aortic aneurysm and may consider a possible CT  scan of the abdomen as an outpatient to further evaluate as necessary by  Dr. Andee Lineman.      Bevelyn Buckles. Bensimhon, MD  Electronically Signed     DRB/MEDQ  D:  05/16/2009  T:  05/17/2009  Job:  [id number]

## 2011-04-03 NOTE — Assessment & Plan Note (Signed)
[facility]                          [facility]   Jermaine, Hughes                       MRN:          [id number]  DATE:06/01/2009                            DOB:          Jul 31, 1935    PRIMARY CARDIOLOGIST:  [name], MD, [facility]   PRIMARY ELECTROPHYSIOLOGIST:  Duke Salvia, MD, [facility]   REASON FOR VISIT:  Post hospital followup.   [name] returns to our clinic following brief hospitalization at  [facility] for evaluation of chest pain.  He was admitted directly from  this clinic, following recent complaint of worsening exertional chest  discomfort and shortness breath.  Of note, he presented to the clinic  for scheduled pacer followup, with Dr. Graciela Husbands.  Review of his chart today  indicates that he has a battery life expectancy of 7 years.   The patient ruled out for myocardial infarction.  He was referred for a  cardiac catheterization, performed by Dr. Gala Romney, which yielded  nonobstructive CAD with a patent CFX stent.  Left ventricular function  was normal (EF 65%), with no regional wall motion abnormalities.   Dr. Gala Romney did, however, point out a moderate stenosis in the mid LAD  (50-60%), which most likely appeared to be an eccentric plaque.  He did  suggest a followup Myoview to further assess this, if the patient were  to have recurrent chest pain.  He also noted the appearance of some  distal aortic plaque, raising question of a possible small abdominal  aortic aneurysm.  He suggested considering a followup CT scan of the  abdomen.   Clinically, [name] notes no change in his symptoms.  As previously  noted, these consist of significant exertional dyspnea with associated  chest discomfort, with mild-to-moderate exertion.  He denies any  symptoms at rest.  No recent medication adjustments were made.   CURRENT MEDICATIONS:  Unchanged from previous visit.   PHYSICAL EXAMINATION:  VITAL SIGNS:  Blood  pressure 132/86, pulse 60 and  regular, weight 285.  GENERAL:  A 76 year old male, obese, sitting upright, no distress.  HEENT:  Normocephalic, atraumatic.  NECK:  No JVD.  LUNGS:  Clear to auscultation in all fields.  HEART:  Regular rate and rhythm.  No significant murmurs.  ABDOMEN:  Protuberant, intact bowel sounds.  EXTREMITIES:  Palpable right femoral pulse with no hematoma, ecchymosis,  or bruit on auscultation.  Intact distal pulses with trace edema.  NEUROLOGIC:  No focal deficit.   IMPRESSION:  1. Persistent exertional angina/dyspnea.      a.     Nonobstructive CAD by recent catheterization, with widely       patent posterolateral branch stent.      b.     Residual moderate mid LAD stenosis.      c.     Normal LVF.      d.     Status post Taxus stenting of posterolateral branch of       circumflex, May 2006.  2. Question abdominal aortic aneurysm.  3. Carotid sinus hypersensitivity.      a.  Status post dual-chamber pacemaker.  4. Hypertension.  5. Dyslipidemia.  6. Type 2 diabetes mellitus.  7. Obstructive sleep apnea, on CPAP.   PLAN:  1. Proceed with a Lexiscan stress Myoview to further assess the LAD      stenosis.  If this is negative, then we will need to consider      additional workup to explain his shortness of breath and chest      discomfort.  If, however, this does yield evidence of ischemia in      the LAD territory, then we will need to strongly consider having      him return to the lab for percutaneous intervention.  2. Abdominal ultrasound to rule out AAA, as noted by recent      catheterization.  3. Discontinue Plavix, and initiate low-dose aspirin 81 mg daily.  The      patient is now more than 4 years out since undergoing drug-eluting      stenting.  4. Schedule early clinic followup with myself and Dr. Andee Lineman in 1      month.  The patient will also need to follow up with Dr. Sherryl Manges in approximately 6 months.      Rozell Searing,  PA-C  Electronically Signed      Rollene Rotunda, MD, [facility]  Electronically Signed   GS/MedQ  DD: 06/01/2009  DT: 06/02/2009  Job #: [id number]   cc:   Lia Hopping

## 2011-04-03 NOTE — Letter (Signed)
April 22, 2007    [name]  [address]  [place], [place] [address]   RE:  Jermaine, Hughes  MRN:  [id number]  /  DOB:  Jun 03, 1935   Dear Dr. Olena Leatherwood:   [name] comes in today.  He had syncope in the setting of carotid  sinus hypersensitivity.  He relates that he had had cholecystectomy and  peritonitis, lost a bunch of weight, but has unfortunately put the  weight back on.   His diet is also apparently full of salt and fluid and he has complaints  of intermittent shortness of breath and exercise intolerance.   He also has diabetes and his medications are notable for the use of  Actos.  His other medications include hydrochlorothiazide, Probenecid,  metoprolol and Plavix.   On examination, his blood pressure was 133/76 and pulse was 83.  The  lungs were clear.  Heart sounds were regular.  Neck veins were 7-8 cm  and there was 2+ peripheral edema.   Interrogation of his Medtronic Kappa 901 pulse generator demonstrates a  P wave of 4, an impedance of 417 and a threshold of 0.75 at 0.4 with an  R wave of 15.7 with an impedance of 510 and a threshold of 1 volt at  0.4.  Battery voltage was 2.78 with an estimated longevity of 7-1/2  years.   IMPRESSION:  1. Syncope thought to be related to carotid sinus hypersensitivity      without recurrence.  2. Status post pacer for the above.  3. Ischemic heart disease with:      a.     Normal left ventricular function.      b.     Prior Taxus stent.  4. Diabetes.  5. Congestive heart failure -- diastolic -- chronic.   Dr. Olena Leatherwood, [name] has modest diastolic heart failure in the  setting of his hypertension, obstructive sleep apnea and obesity.  He is  also fluid-overloaded.  There were 2 issues that I had; one was whether  it might be appropriate to consider an alternative to his Actos as the  TCDs might be contributing to his fluid retention; the other was I gave  him a prescription in the interim for Lasix to take 20 mg on an  as-  needed basis for fluid overload as an alternative to his  hydrochlorothiazide.   He will see you again in 3 months.  We will see him again in 1 year's  time and follow him remotely in the interim.    Sincerely,      Duke Salvia, MD, [facility]  Electronically Signed    SCK/MedQ  DD: 04/22/2007  DT: 04/22/2007  Job #: [id number]   CC:    Essie [facility] [place] [facility]

## 2011-04-03 NOTE — Assessment & Plan Note (Signed)
[facility]                          [facility]   [name], [name]                       MRN:          [id number]  DATE:08/18/2008                            DOB:          06-02-1935    PRIMARY CARDIOLOGIST:  [name], MD,FACC   REASON FOR VISIT:  Post-hospital followup.   [name] is a 76 year old male, well known to [place] with history of  coronary artery disease, who has been hospitalized here at [facility] on 2  separate occasions this past month.  In both instances, he presented  with persistent dizziness.  In both instances, there was no definite  evidence of a cardiac etiology.  His pacemaker was interrogated at the  time of his first visit.  We also recommended a followup outpatient  stress test.  This was an adenosine study and suggested a small,  partially reversible basal lateral defect, but in the setting of  diaphragmatic attenuation.  [name] felt that this was an overall  low-risk study, but that he could not entirely exclude mild ischemia.  Left ventricular function was normal (EF 54%).   Clinically, [name] denies any chest pain.  He does complain of  persistent exertional dyspnea, however.  During both recent  hospitalizations, serial cardiac markers have been normal.  He also had  no evidence of congestive heart failure by chest x-ray.   He apparently also had a CT angiogram of the head, per [name], who  has evaluated him for this dizziness.  There were no significant  findings by this study.  [name] tells me that [name] concluded  that his symptoms are secondary to inner ear.   [name] tells me that he essentially is suffering from a loss of  balance, but that this occurs only when he closes his eyes.  He does not  have any symptoms at all, as long as he is either standing or walking  with his eyes wide open.  He states that this is reminiscent of how he  felt before undergoing  pacemaker implantation.  He also recalls  experiencing dizziness with lateral movement of his head prior to  placement of his pacemaker, but that this subsequently resolved.  He is  currently not experiencing any dizziness with movement of his head,  however.   During the second hospitalization, we also recommended discontinuing the  Lasix, given that his symptoms could be related to volume depletion.  He  has known history of orthostatic hypotension in the past, prompting  discontinuation of his ReQuip, by [name].  His metoprolol was also  downtitrated, as well.   CURRENT MEDICATIONS:  1. Metoprolol tartrate 25 daily.  2. Pantoprazole.  3. Januvia.  4. Probenecid.  5. Flomax.  6. Plavix.  7. Allopurinol.   PHYSICAL EXAMINATION:  VITAL SIGNS:  Blood pressure 124/72, pulse 85,  regular, and weight 285 (up 1).  GENERAL:  A 76 year old male, morbidly obese, sitting upright, and in no  distress.  HEENT:  Normocephalic, atraumatic.  NECK:  Palpable bilateral carotid pulse without bruits; unable to assess  JVD, secondary to neck girth.  LUNGS:  Clear to auscultation in all fields.  HEART:  Regular rate and rhythm.  No significant murmurs.  ABDOMEN:  Markedly protuberant.  EXTREMITIES:  Trace pedal edema.  NEUROLOGIC:  No focal deficit.   IMPRESSION:  1. Persistent positional dizziness.      a.     Occurs only when eyes are closed.      b.     History of orthostatic hypotension, on ReQuip.      c.     Status post recent negative pacemaker interrogation.      d.     History of carotid sinus hypersensitivity.  2. Coronary artery disease, stable.      a.     Status post Taxus stenting of CFX, May 2006.      b.     Patent stents by relook catheterization in August of that       year.      c.     Recent low-risk adenosine Cardiolite; EF 54%.  3. Normal LVF.  4. History of hypertension.  5. Dyslipidemia.  6. Type 2 diabetes mellitus.  7. Obstructive sleep apnea.  8. Morbid  obesity.   PLAN:  1. Following discussion with [name], recommendation is to refer      [name] to [name] in [place] for a second opinion      with respect to his persistent dizziness.  [name] was quite      agreeable with this plan and is willing to proceed.  2. Schedule return clinic followup with myself and [name] in 4      months.  The patient is,      otherwise, to maintain regular followup with Dr. Sherryl Manges here      in the [facility] in [place], as previously scheduled.       Gene Serpe, PA-C  Electronically Signed      [name], MD,FACC  Electronically Signed   GS/MedQ  DD: 08/18/2008  DT: 08/19/2008  Job #: [id number]   cc:   Lia Hopping

## 2011-04-03 NOTE — Assessment & Plan Note (Signed)
[facility]                          [facility]   [name], [name]                       MRN:          [id number]  DATE:05/13/2009                            DOB:          06/02/1935    PRIMARY CARDIOLOGIST:  Dr. Lewayne Bunting.   PRIMARY ELECTROPHYSIOLOGIST:  Dr. Sherryl Manges, in [place].   REASON FOR ADMISSION:  [name] is a very pleasant 76 year old male,  with known coronary artery disease, who presented today for scheduled  pacer clinic follow-up.  Pacemaker interrogation was completed, and  adjustments notable for decreased RA and RV output.  Pacemaker is in DDD  mode.   Clinically, however, [name] reported to Dr. Graciela Husbands that he has been  experiencing chest pain intermittently over these past 2-3 weeks.  He  had his worst episode last evening, while lying in bed, but is currently  asymptomatic.  He states that this is similar to what he has experienced  in the past.  He did not take any nitroglycerin, because he is allergic  to this.  The chest pain is described as sharp, localized to the mid  sternum, and associated with shortness of breath, but no radiation to  the upper extremities.  He also reports some exertional angina, as well,  relieved with rest, as well as associated shortness of breath.  However,  he has also been experiencing chest discomfort at rest, as well.   EKG in our office today indicates normal sinus rhythm with occasional  PVCs.  There are no acute changes.   ALLERGIES:  Nitroglycerin.   HOME MEDICATIONS:  1. Plavix 75 daily.  2. Metoprolol tartrate 50 daily.  3. Lasix 20 daily.  4. Flomax 0.4 daily.  5. Actos 30 daily.  6. Allopurinol 300.  7. Pantoprazole 40 daily.  8. Probenecid 500 b.i.d.   PAST MEDICAL HISTORY:  1. Single-vessel coronary artery disease.      a.     Status post Taxus stenting of high-grade posterolateral       branch of CFX artery, May 2006.      b.     Widely patent  stent site by cardiac catheterization, August       2006.  2. Normal left ventricular function.  3. Hypertension.  4. Dyslipidemia.  5. Type 2 diabetes mellitus.  6. Obstructive sleep apnea, on CPAP.  7. Morbid obesity.  8. Carotid sinus hypersensitivity.      a.     Associated syncope.      b.     Status post Medtronic dual-chamber pacemaker, August 2006.  9. History of orthostatic hypotension, on Requip.  10.History of diastolic heart failure.  11.GERD.  History of esophageal stricture/dilatation.  12.Restless leg syndrome.  13.Gout.   SOCIAL HISTORY:  The patient is married.  Denies tobacco or alcohol use.   FAMILY HISTORY:  Father deceased, history of myocardial infarction.   REVIEW OF SYSTEMS:  Reports occasional PND, recent two-pillow orthopnea,  and worsening lower extremity edema.  Denies any evidence of overt  bleeding.  All other systems reviewed, and are negative.  PHYSICAL EXAMINATION:  Blood pressure 119/76, pulse 73 -regular, weight  286 (down 6).  GENERAL:  A 76 year old male, obese, sitting upright, no distress.  HEENT:  Normocephalic, atraumatic.  PERRLA.  EOMI.  NECK:  Palpable bilateral carotid pulses without bruits; unable to  assess JVD, secondary to neck girth.  LUNGS:  Clear to auscultation in all fields.  HEART:  Regular rate and rhythm.  No significant murmurs.  No rubs or  gallops.  ABDOMEN:  Protuberant, intact bowel sounds.  EXTREMITIES:  Palpable bilateral femoral pulses without bruits; intact  distal pulses with 1+ lower extremity edema.  SKIN:  Warm and dry.  MUSCULOSKELETAL:  No gross abnormality.  NEUROLOGICAL:  No focal deficit.   IMPRESSION:  1. Unstable angina pectoris.  2. Single-vessel coronary artery disease.      a.     Status post Taxus stenting of posterolateral branch of       circumflex artery, May 2006.  3. Normal left ventricular function.  4. Hypertension.  5. Dyslipidemia.  6. Type 2 diabetes mellitus.  7. Obstructive  sleep apnea.  8. Carotid sinus hypersensitivity.      a.     Status post dual-chamber pacemaker.   PLAN:  Recommendation is to transfer the patient directly to [facility] for further evaluation and close monitoring.  The patient  presents with symptoms worrisome for unstable angina pectoris.  Therefore, plan is to proceed with cardiac catheterization on Monday.  In the meanwhile, the patient will be treated with aspirin, Plavix, and  a beta-blocker.  We will add a statin and start intravenous heparin.  Serial cardiac markers will be cycled, and we will check a follow-up EKG  in the morning.      Rozell Searing, PA-C  Electronically Signed      Duke Salvia, MD, [facility]  Electronically Signed   GS/MedQ  DD: 05/13/2009  DT: 05/13/2009  Job #: [id number]   cc:   Lia Hopping

## 2011-04-03 NOTE — Assessment & Plan Note (Signed)
[facility]                          [facility] NOTE   NAME:[name], [place]                       MRN:          [id number]  DATE:07/09/2007                            DOB:          03/27/1935    HISTORY OF PRESENT ILLNESS:  The patient is a 76 year old male with a  history of coronary artery disease and carotid sinus hypersensitivity  requiring pacemaker implantation.  The patient also has significant  problems with orthostatic hypotension with a sensitivity to vasodilator  therapy.  Previously the patient had been on Requip which caused  significant problems with orthostasis.  The patient was then doing well  for some time.  Until more recently, when he started having problems  with dizziness again.  Interestingly, however, he was recently started  on Flomax, another agent with vasodilating properties.  The patient  states when he tries to stand up quickly, he develops symptoms of  dizziness.  However, lying down or sitting down he is essentially  asymptomatic.  Orthostatic blood pressures were done in the office which  did not show significant tachycardic response or hypotensive response.  The patient did become dizzy, however, upon standing.   MEDICATIONS:  1. Furosemide 20 mg p.o. daily.  2. Oxycodone p.r.n.  3. Allopurinol 10 mg p.o. daily.  4. Plavix 75 mg p.o. daily.  5. Protonix 40 mg p.o. daily.  6. Probenecid 500 mg p.o. daily.  7. Crestor 10 mg p.o. daily.  8. Lopressor 50 mg p.o. daily.  9. Flomax 0.4 mg p.o. daily.   PHYSICAL EXAMINATION:  VITAL SIGNS:  Blood pressure is 124/78, heart  rate is 75, weight is 254 pounds.  NECK:  Normal carotid upstrokes.  No carotid bruits.  LUNGS:  Clear breath sounds bilaterally.  HEART:  Regular rate and rhythm.  Normal S1 S2.  No murmurs, rubs, or  gallops.  ABDOMEN:  Soft, nontender.  No rebound or guarding.  Good bowel sounds.  EXTREMITIES:  No cyanosis, clubbing, or edema.   A  12-lead electrocardiogram:  Normal sinus rhythm, no acute ischemic  changes, occasional PVCs.   PROBLEM LIST:  1. Dizziness secondary to vasodilator therapy.  2. Coronary artery disease.      a.     Status post TAXUS stent to circumflex May 2006.      b.     Status post patent stents by catheterization August 2006.      c.     Normal LV function.  3. History of carotid sinus hypersensitivity.      a.     Associated syncope.      b.     Status post Medtronic pacemaker in August 2006.  4. History of orthostatic hypotension on Requip.  5. Obstructive sleep apnea with CPAP treatment.  6. Hypertension.  7. Dyslipidemia.  8. Type 2 diabetes mellitus.  9. Obesity.   PLAN:  1. The patient continues to be very sensitive to vasodilator drug      therapy and we have cut his Lasix to 10 mg a day.  2. I also asked him to make  sure that he has adequate hydration.  3. The patient does also complain of pain in his lower extremities      which does not appear to be claudication.  We will go ahead,      however, and check the patient with ABIs bilaterally.  He also has      pain in the lower extremities that I suspect may be from Crestor      and we have cut this back to 5 mg p.o. daily.  4. The patient will follow up with [place] in the next couple of months.     [name], MD,FACC  Electronically Signed    GED/MedQ  DD: 07/10/2007  DT: 07/11/2007  Job #: [id number]   cc:   Lia Hopping

## 2011-04-06 NOTE — Discharge Summary (Signed)
NAMECORTNEY, [name]                ACCOUNT NO.:  [id number]   MEDICAL RECORD NO.:  [id number]          PATIENT TYPE:  INP   LOCATION:  3731                         FACILITY:  [facility]   PHYSICIAN:  Bevelyn Buckles. Bensimhon, MDDATE OF BIRTH:  Feb 05, 1935   DATE OF ADMISSION:  05/13/2009  DATE OF DISCHARGE:  05/16/2009                               DISCHARGE SUMMARY   PROCEDURES:  1. Cardiac catheterization.  2. Coronary arteriogram.  3. Left ventriculogram.   PRIMARY FINAL DISCHARGE DIAGNOSIS:  Chest pain.   SECONDARY DIAGNOSES:  1. Status post TAXUS stent to a branch of the circumflex artery in      2006.  2. Preserved left ventricular function.  3. Hypertension.  4. Diabetes.  5. Dyslipidemia.  6. Obstructive sleep apnea on continuous positive airway pressure.  7. Morbid obesity.  8. Carotid sinus hypersensitivity with syncope status post Medtronic      pacemaker in 2006.  9. History of orthostatic hypotension on Requip.  10.Chronic diastolic congestive heart failure.  11.Gastroesophageal reflux disease with a history of esophageal      stricture and dilatation.  12.Restless leg syndrome.  13.Gout.  14.Family history of coronary artery disease in his father.   HOSPITAL COURSE:  [name] was a 76 year old male with a history of  coronary artery disease.  He was seen in the [place] office on June 25, and  he needs a generator change but was having chest pain.  He was admitted  to the hospital for further evaluation.   His cardiac enzymes were negative for MI.  A lipid profile showed a  total cholesterol of 235, triglycerides 129, HDL 38, LDL 171.  TSH was  slightly elevated at 6.316.  Blood sugar was 191 and a hemoglobin A1c  was not performed.  He was taken to the cath lab on May 16, 2009, and  the catheterization showed 50%-60% mid lesion and the first diagonal had  a 30% stenosis.  There was a 30%-40% stenosis in the circumflex and a  40%-50% lesion in the ramus.  The RCA had  no high-grade stenosis and  previously placed stents were patent.  His EF was 65%.  Dr. Gala Romney  felt that he was having noncardiac chest pain.  He had moderate but  nonobstructive stenosis in the LAD.  There is possibly a small abdominal  aortic aneurysm noted after the left ventriculogram and this is to be  followed up by Dr. Andee Lineman.  [name] was stable post cath and  discharged home on May 16, 2009.   DISCHARGE INSTRUCTIONS:  His activity level is to include no lifting for  the next 3-4 days.  He is encouraged to stick to a low-sodium heart-  healthy diet.  He is to call our office for problems with the cath site.  He is to follow up with Dr. Andee Lineman and our office will call him with a  followup.  He is to follow up with Dr. Graciela Husbands as needed.  He is to follow  up with Dr. Olena Leatherwood as scheduled.   DISCHARGE MEDICATIONS:  1. Allopurinol 300 mg  daily.  2. Actos 30 mg a day.  3. Flomax 0.4 mg daily.  4. Lasix 20 mg a day.  5. Metoprolol 50 mg a day.  6. Protonix 40 mg a day.  7. Plavix 75 mg a day.  8. Probenecid 500 mg b.i.d.      Theodore Demark, PA-C      Bevelyn Buckles. Bensimhon, MD  Electronically Signed    RB/MEDQ  D:  05/17/2009  T:  05/18/2009  Job:  [id number]   cc:   Lia Hopping

## 2011-04-06 NOTE — Cardiovascular Report (Signed)
NAME:  [name], [name] NO.:  [id number]   MEDICAL RECORD NO.:  [id number]          PATIENT TYPE:  INP   LOCATION:  4733                         FACILITY:  [facility]   PHYSICIAN:  Charlies Constable, M.D. [facility] DATE OF BIRTH:  02/01/1935   DATE OF PROCEDURE:  04/02/2005  DATE OF DISCHARGE:                              CARDIAC CATHETERIZATION   CLINICAL HISTORY:  Mr.  [name] is 76 years old who has had no prior of known  heart disease, but he does have diabetes and hypertension.  He was admitted  to the hospital with chest pain felt to be consistent with unstable angina.  He was transferred here for further evaluation with angiography.   PRESENT HISTORY:  The patient used to work as a Electrical engineer at [employer].   PROCEDURE:  The procedure was performed via the right femoral artery using  arterial sheath and 6 French preformed coronary catheters. A front wall  arterial puncture was performed and Omnipaque contrast was used.  After  completion of the diagnostic study, we made a decision to proceed with  intervention on the posterior lateral branch of the circumflex artery.   The patient was given Angiomax bolus and infusion, and was given 600 mg of  Plavix.  We used a CLS-4 6 [place] guiding catheter with side holes, then an  Office manager.  We had some difficulty navigating the wire past  multiple bends in the proximal and mid circumflex artery to and across the  lesion.  We were finally able to accomplish this.  We predilated with a 2.5  x 12-mm Maverick performing one inflation up to 10 atmospheres for 30  seconds.  We then positioned a 2.75 x 60-mm Taxus stent and deployed this  with one inflation of 15 atmospheres for 30 seconds.  Repeat diagnostic  studies were then performed through the guiding catheter.  The patient  tolerated the procedure well and left the laboratory in satisfactory  condition.   The aortic pressure was 126/86 with mean of 105, and  left ventricular  pressure was 126/10.   The left main coronary artery:  The left main coronary artery was free of  significant disease.   Left anterior descending artery:  The left anterior descending artery gave  rise to two diagonal branches and three septal perforators.  These and the  LAD proper were free of significant disease.   Circumflex artery:  The circumflex artery was a large, dominant vessel that  gave rise to a ramus branch, marginal branch, and a posterior lateral, and a  posterior descending branch.  The posterior lateral branch had a 90%  stenosis in its proximal portion.   Right coronary artery:  The right coronary artery is a nondominant vessel.  It only supplied two small right ventricular branches.   LEFT VENTRICULOGRAM:  The left ventriculogram performed in the RAO  projection showed good wall motion with no areas of hypokinesis.  The  estimated ejection fraction was 60%.   Following stenting of the posterior lateral branch of the circumflex artery,  the stenosis improved from  90% to less than 10%.  The flow was TIMI-2 before  and after intervention.   CONCLUSION:  1.  Coronary artery disease with no significant obstruction in the left      anterior descending artery, 90% stenosis in the posterior lateral branch      of the circumflex artery, no significant obstruction in a small      nondominant right coronary artery with normal LV function.  2.  Successful stenting of the lesion in the posterior lateral branch of the      circumflex artery with improvement in the stent renarrowing from 90% to      less than 10%.   DISPOSITION:  The patient was returned to the postangioplasty unit for  further observation.      BB/MEDQ  D:  04/02/2005  T:  04/02/2005  Job:  [id number]   cc:   Annette Stable Hasanaj  [address]  [place]  [place] [address]  Fax: [phone]   Jonelle Sidle, M.D. [facility]

## 2011-04-06 NOTE — Procedures (Signed)
   NAME:  [name], [name] NO.:  [id number]   MEDICAL RECORD NO.:  [id number]                   PATIENT TYPE:  EMS   LOCATION:  ED                                   FACILITY:  [facility]   PHYSICIAN:  Edward L. Juanetta Gosling, M.D.             DATE OF BIRTH:  [birth year]   DATE OF PROCEDURE:  07/11/2003  DATE OF DISCHARGE:  07/11/2003                                EKG INTERPRETATION   EKG INTERPRETATION:  The rhythm is sinus rhythm with PVCs.  There are Q  waves inferiorly which could indicate a previous inferior myocardial  infarction and clinical correlation is suggested.  Abnormal  electrocardiogram.                                               Oneal Deputy. Juanetta Gosling, M.D.    ELH/MEDQ  D:  07/12/2003  T:  07/12/2003  Job:  [id number]

## 2011-04-06 NOTE — Procedures (Signed)
   NAME:  [name], [name] NO.:  [id number]   MEDICAL RECORD NO.:  [id number]                   PATIENT TYPE:  EMS   LOCATION:  ED                                   FACILITY:  [facility]   PHYSICIAN:  Edward L. Juanetta Gosling, M.D.             DATE OF BIRTH:  Nov 30, 1934   DATE OF PROCEDURE:  07/11/2003  DATE OF DISCHARGE:  07/11/2003                                EKG INTERPRETATION   DATE AND TIME OF TEST:  July 11, 2003 at 1942.   INTERPRETATION:  The rhythm is sinus rhythm with PVCs.  Q waves are seen in  limb lead III and aVF suggestive of previous inferior myocardial infarction.  Abnormal electrocardiogram.                                               Oneal Deputy. Juanetta Gosling, M.D.    ELH/MEDQ  D:  07/23/2003  T:  07/23/2003  Job:  [id number]

## 2011-04-06 NOTE — Assessment & Plan Note (Signed)
[facility]                            [facility] OFFICE NOTE   FLEMING, PRILL                       MRN:          [id number]  DATE:09/26/2006                            DOB:          11-Jun-1935    PRIMARY CARDIOLOGIST:  [name], M.D., [facility]   REASON FOR OFFICE VISIT:  Scheduled six-month follow up.   HISTORY:  Since being seen here in the clinic in February 2007 by Dr. Andee Lineman  the patient continues to do well with no interim development of exertional  angina pectoris, dyspnea, tachy palpitations or syncope.  The patient is  scheduled to see Dr.Klein this month for his regularly scheduled follow up  of his permanent pacemaker.  The patient does point out, however, that he  has been plagued by persistent right abdominal pain ever since undergoing  cholecystectomy in January 2007.  This appears to be precipitated when he  lies on the his right side.   Electrocardiogram today reveals normal sinus rhythm attention 77 beats per  minute with normal axis and no ischemic changes.   CURRENT MEDICATIONS:  1. Actos 30 mg daily.  2. Allopurinol 300 mg daily.  3. Crestor 10 mg daily.  4. Probenecid 50 mg twice a day.  5. Clopidogrel 75 mg daily.  6. Lopressor 50 mg daily.  7. Protonix 40 mg daily.  8. Hydrochlorothiazide 25 mg daily.  9. Flomax 0.4 mg daily.   PHYSICAL EXAMINATION:  VITAL SIGNS:  Blood pressure 170/86 and pulse 77 and  regular.  Weight 280 pounds (up 10 pounds).  GENERAL APPEARANCE:  In general this is a 76 year old male sitting upright  and in no apparent distress.  NECK:  The neck has palpable bilateral carotid pulses without bruits.  LUNGS:  The lungs are clear to auscultation in all fields.  HEART:  The heart has a regular rate and rhythm, S1 and S2.  No significant  murmurs.  ABDOMEN:  The abdomen is protuberant and nontender.  EXTREMITIES:  The extremities have 1+ nonpitting lower extremity edema.  NEUROLOGIC:   On neurological exam there is no focal deficit.   IMPRESSION:  1. Coronary artery disease;      a.     Status post TAXUS stenting to the circumflex in May 2006,      b.     Patent stents by catheterization in August 2006;      c.     Normal left ventricular function.  2. History of carotid sinus hypersensitivity;      a.     Associated syncope;      b.     Status post Medtronic pacemaker, August 2006;  3. History of orthostatic hypotension.      a.     Since resolved following discontinuation of Requip.  4. Obstructive sleep apnea/CPAP.  5. Hypertension.  6. Hyperlipidemia.  7. Type 2 diabetes mellitus.  8. Morbid obesity.   PLAN:  1. The patient was instructed to maintain regular follow up with Dr.      Olena Leatherwood for continued close monitoring and management of his  current      antihypertensive regimen.  He reports that this was recently adjusted      with the discontinuation of Diovan.  2. Aggressive lipid management per Dr. Olena Leatherwood with an LDL goal of less      than 70.  3. Keep scheduled follow up the Dr. Sherryl Manges later this month.  4. Resume general follow up with Dr. Andee Lineman in six months.      Gene Serpe, PA-C  Electronically Signed      [name], MD,FACC  Electronically Signed   GS/MedQ  DD: 09/26/2006  DT: 09/26/2006  Job #: [id number]   cc:   Lia Hopping

## 2011-04-06 NOTE — Discharge Summary (Signed)
[name], [name] NO.:  [id number]   MEDICAL RECORD NO.:  [id number]          PATIENT TYPE:  OIB   LOCATION:  4703                         FACILITY:  [facility]   PHYSICIAN:  Duke Salvia, M.D.  DATE OF BIRTH:  [birth year]   DATE OF ADMISSION:  07/16/2005  DATE OF DISCHARGE:                                 DISCHARGE SUMMARY   ADDENDUM:  This addendum concerns an episode of presyncope and  vasodilatation with a drop in blood pressure this patient experienced when  getting to discharge on the morning of July 17, 2005.  At the time of this  episode at about 11 a.m., the patient's monitor had been discontinued as  well as the IV had just been taken out.  The patient felt dizzy.  He was  actually sitting down.  His wife was putting on his shoes.  He felt dizzy  and nauseated and all color drained from his face.  Blood pressure was taken  and it was 89/51.  A heart rate is not noted.  After about 15 to 20 minutes,  the patient had regained color and blood pressure returned into the 120s  systolic.  The patient's wife relates that these episodes have become ever  more frequent and more pronounced and the patient did as just dictated  receive a pacemaker on the morning of July 16, 2005.  This would alleviate  the bradycardia symptomatology, however, vasodilatation is still an issue  and as mentioned by Dr. Graciela Husbands, the pacemaker would not address this  particular.  After discussion with Dr. Graciela Husbands, who has decided to allow the  patient's blood pressure to drift up a little bit, and so he will run  without his Diovan 80 mg at discharge and follow up with Dr. Andee Lineman on  Wednesday, August 15, 2005 to see how he is doing.  He has still  continued to take Lopressor 1/2 tab twice daily.  Of note, if he still  continues to have these episodes he is to call Dr. Andee Lineman sooner than the  office visit on August 15, 2005.   Maple Mirza, P.A. dictating this addendum to  address continue  vasodilation in the setting of carotid sinus hypersensitivity.      Maple Mirza, P.A.    ______________________________  Duke Salvia, M.D.    GM/MEDQ  D:  07/17/2005  T:  07/17/2005  Job:  [id number]   cc:   Duke Salvia, M.D.  [address]  [address]  [place]  [place] [address]   [name], M.D. [facility]  [address]  [address]  [place]  [place] [address]

## 2011-04-06 NOTE — Discharge Summary (Signed)
NAMEGARTH, Hughes                ACCOUNT NO.:  [id number]   MEDICAL RECORD NO.:  [id number]          PATIENT TYPE:  INP   LOCATION:  2853                         FACILITY:  [facility]   PHYSICIAN:  Salvadore Farber, M.D. LHCDATE OF BIRTH:  [birth year]   DATE OF ADMISSION:  06/27/2005  DATE OF DISCHARGE:  06/28/2005                                 DISCHARGE SUMMARY   PROCEDURES:  1.  Cardiac catheterization.  2.  Coronary arteriogram.  3.  Left ventriculogram.   DISCHARGE DIAGNOSES:  1.  Chest pain, status post cardiac catheterization this admission showing      no end-stent restenosis and no changes from the catheterization Apr 02, 2005, preserved left ventricular function.  2.  Status post percutaneous transluminal coronary angioplasty and Taxis      stent to the posterolateral branch of the circumflex in May of 2006.  3.  Hypertension.  4.  Hyperlipidemia.  5.  Diabetes.  6.  Obesity.  7.  History of INTOLERANCE TO NITROGLYCERIN.  8.  ALLERGY TO SHRIMP WITH ITCHING AND RASH.  9.  History of esophageal stricture and dilatation.  10. Restless leg syndrome.  11. Possible obstructive sleep apnea.  12. Family history of coronary artery disease in his father and brother      prior to age 65.  60. History of gout.   HOSPITAL COURSE:  Mr. Fredia Sorrow is a 76 year old male with known coronary  artery disease.  He was admitted to [facility] on June 26, 2005 for  right shoulder discomfort with exertional and general fatigue.  He was seen  by cardiology there, and it was felt that cardiac catheterization was  indicated to further define his anatomy.  He was transferred to [facility] for this on June 28, 2005.   The cardiac catheterization showed a patent stent with no end-stent  restenosis.  There was a 20% stenosis in the circumflex, and a branch of the  RCA had a 99% lesion.  Dr. Samule Ohm evaluated the films and felt that there  was no change since Apr 02, 2005.   There was no evident cardiac explanation  for his dyspnea.  Dr. Samule Ohm felt that he could continue his exercise  program and follow up as an outpatient.  Pending completion of his bedrest,  he is tentatively considered stable for discharge on June 28, 2005 with  outpatient followup arranged.   ACTIVITY:  No driving for two days and no lifting for a week.   DIET:  He is to stick to a low fat diabetic diet.   DISCHARGE INSTRUCTIONS:  He is to call our office for problems with the  catheterization site.   FOLLOW UP:  He is to follow up with Arnette Felts from [facility] in  [place] on July 11, 2005 at 1:15, and he is to follow up with Dr. Olena Leatherwood as  well.   DISCHARGE MEDICATIONS:  1.  Diovan 80 mg daily.  2.  Allopurinol 300 mg daily.  3.  Plavix 75 mg daily.  4.  Lopressor 50 mg,  one-half tablet b.i.d.  5.  Protonix 40 mg daily.  6.  __________ 500 mg b.i.d.  7.  Requip 1 mg daily.  8.  Coated aspirin 81 mg daily.  9.  Avandia 4 mg daily.  10. Nitroglycerin sublingual p.r.n.  11. Crestor 10 mg daily.      Theodore Demark, P.A. [facility]      Salvadore Farber, M.D. [facility]  Electronically Signed    RB/MEDQ  D:  06/28/2005  T:  06/29/2005  Job:  [id number]   cc:   Annette Stable Hasanaj  [address]  [place]  [place] [address]  Fax: [phone]

## 2011-04-06 NOTE — Assessment & Plan Note (Signed)
[facility]                         ELECTROPHYSIOLOGY OFFICE NOTE   CLESTER, CHLEBOWSKI                       MRN:          [id number]  DATE:10/29/2006                            DOB:          1935-08-01    [name] is seen. He is status post pacemaker implantation for  syncope. He has had no recurrent syncope.   He has had no problem with chest pain or shortness of breath. He has had  problems with gallbladder requiring surgery and back problems.   His medications are notable for the intercurrent discontinuation and  resumption of a beta blocker. He is also on Plavix, and he asked about  these two medications. He is on hydrochlorothiazide, Actos, Protonix,  Crestor and allopurinol.   On examination today, his blood pressure was 131/85, his pulse is 83,  and he was not weighed. His last weight was 292 pounds.  His lungs were clear.  Heart sounds were regular.  Extremities were without edema.   Interrogation of his Medtronic Kappa pulse generator demonstrates a P  wave with an impedence of 430, threshold of 0.5 to 0.4 with a R wave of  15.6 with impedence of 533, a threshold of 0.75 to 0.4. Battery voltage  is 2.78.   IMPRESSION:  1. Syncope.  2. Status post pacer for the above.  3. Ischemic heart disease with:      a.     Normal left ventricular function.      b.     Prior Taxus stent.  4. Obesity.  5. Diabetes.   [name] is doing okay from the arrhythmia point of view. He has had  no recurrent chest pain. He asked about his Plavix and beta blocker, and  with his coronary artery disease and Taxus stent, I told him that these  were ongoing and indefinite.   We also encouraged him to undertake an exercise program and try and  shoot to loose some weight, shooting for a 2-pound per month goal with  exercise and decreased caloric intake.   We will see him again in six months' time.     Duke Salvia, MD, [facility]  Electronically  Signed    SCK/MedQ  DD: 10/29/2006  DT: 10/29/2006  Job #: [id number]   cc:   Lia Hopping

## 2011-04-06 NOTE — Op Note (Signed)
NAME:  [name], [name] NO.:  [id number]   MEDICAL RECORD NO.:  [id number]                   PATIENT TYPE:  AMB   LOCATION:  DAY                                  FACILITY:  [facility]   PHYSICIAN:  R. Roetta Sessions, M.D.              DATE OF BIRTH:  November 07, 1935   DATE OF PROCEDURE:  08/17/2003  DATE OF DISCHARGE:                                 OPERATIVE REPORT   PROCEDURE:  Diagnostic colonoscopy.   INDICATION FOR PROCEDURE:  The patient is a 76 year old gentleman who just  had an EGD with dilation of a Schatzki's ring.  He has also had some  intermittent rectal bleeding.  He comes for colonoscopy.  He tells me his  dysphagia has resolved.  I also gave him some Darvocet for his low back  pain, which has helped considerably.  Colonoscopy is being done again to  further evaluate rectal bleeding.  This approach has been discussed with Mr.  Imburgia.  Potential risks, benefits, and alternatives have been reviewed,  questions answered, he is agreeable.  Please see my prior dictated H&P and  my handwritten updated H&P.   PROCEDURE NOTE:  O2 saturation, blood pressure, pulse, and respirations were  monitored throughout the entire procedure.   CONSCIOUS SEDATION:  Versed 3 mg IV, Demerol 50 mg IV.   INSTRUMENT USED:  Olympus video chip adult colonoscope.   FINDINGS:  Digital rectal examination revealed no abnormalities.   Endoscopic findings:  The prep was good.   Rectum:  Examination of the rectal mucosa including a retroflexed view of  the anal verge revealed minimal anal canal hemorrhoids.   Colon:  The colonic mucosa was surveyed from the rectosigmoid junction  through the left, transverse, and right colon, to the area of the  appendiceal orifice, ileocecal valve, and cecum.  These structures were well-  seen and photographed for the record.  The colonic mucosa appeared normal  all the way to the cecum.  From the level of the cecum and ileocecal valve,  the scope was slowly and cautiously withdrawn.  All previously-mentioned  mucosal surfaces were again seen.  No other abnormalities were observed.  The patient tolerated the procedure well, was reactive in endoscopy.   IMPRESSION:  Minimal anal canal hemorrhoids, otherwise normal rectum and  normal colon.   I suspect trivial anorectal bleeding from a benign source.    RECOMMENDATIONS:  No specific further GI evaluation warranted.  At Mr.  Majerus's request I gave him some Darvocet for his low back pain.  Previously  it has helped tremendously and he would like another prescription.  I told  him I would give him a prescription for Darvocet #30, one q.6h. p.r.n. back  pain, but he should follow up with his family doctor.       ___________________________________________  Jonathon Bellows, M.D.   RMR/MEDQ  D:  08/17/2003  T:  08/17/2003  Job:  [id number]

## 2011-04-06 NOTE — Discharge Summary (Signed)
NAME:  FLINT, HAKEEM NO.:  [id number]   MEDICAL RECORD NO.:  [id number]          PATIENT TYPE:  INP   LOCATION:  6523                         FACILITY:  [facility]   PHYSICIAN:  Arvilla Meres, M.D. LHCDATE OF BIRTH:  28-Mar-1935   DATE OF ADMISSION:  03/30/2005  DATE OF DISCHARGE:  04/03/2005                           DISCHARGE SUMMARY - REFERRING   SUMMARY OF HISTORY:  Mr. Rathgeber is a 76 year old white male who presented to  [facility] with a 2-week history of rest nausea followed by becoming  pale, diaphoretic, with chest discomfort which he described as sharp in the  left chest radiating to the left axilla and left neck, and left arm  associated with shortness of breath. He was admitted to [facility] for  further evaluation by Dr. Olena Leatherwood, and Dr. Andee Lineman consulted on Mar 30, 2005.   PAST MEDICAL HISTORY:  His history is notable for hypertension, diabetes,  hyperlipidemia, obesity, and inactivity.   LABORATORY DATA:  At [facility], sodium was 140, potassium 3.9, BUN  17, creatinine 1.2, glucose 120. CK-MB's and troponin's were negative x2.  H&H 13.8 and 39.9; normal indices, platelets 239, WBC's 5.4. EKG: Normal  sinus rhythm, early R-wave, nonspecific ST-T wave changes. CBC at [facility]: H&H remained unchanged at the time of discharge.  This was 14.2  and 41.2; normal indices, platelets 221, WBC's 10.0.  PT at [facility] was 12.8.  Prior to discharge, sodium was 137, potassium 38, BUN 16, creatinine 1.3,  glucose 136. CK, total MB post catheterization was negative. Fasting lipids  showed a total cholesterol 271, triglycerides 594, HDL 28. LDL was not  calculated.   HOSPITAL COURSE:  Mr. Morones was transferred to [facility] on Mar 30, 2005. He did not have any further problems with chest discomfort or  shortness of breath, and he had ruled out for myocardial infarction.  Transfer medications were continued. Given his shrimp allergy, he  was  started on shrimp allergy protocol with plans for a cardiac catheterization.  On Apr 02, 2005, he underwent cardiac catheterization by Dr. Juanda Chance. Please  refer to his dictated note. He had a PL 90% lesion which was reduced with  Taxus stenting to less than 10%.  EF was 60%. Dr. Juanda Chance felt that he should  be on Plavix for 1 year and he could probably be discharged in the morning  on Apr 03, 2005. He was reassessed. Catheterization site did show a slight  ooze but without hematoma or ecchymosis. Dr. Antoine Poche felt that the patient  could be discharged home.   DISCHARGE DIAGNOSIS:  1.  Unstable angina status post stenting to the PL as previously described      with normal LV function.  2.  Hyperlipidemia.  3.  Hypertension on admission.  4.  Obesity history as previously.   DISPOSITION:  Mr. Grieger was discharged home. He was given instructions in  regards to catheterization site care and activities. He is asked to maintain  low salt, low fat, low cholesterol, ADA diet.   DISCHARGE MEDICATIONS:  1.  Plavix 75 mg daily  for 1 year.  2.  Aspirin 325 mg daily.  3.  Lopressor 50 mg, half tablet b.i.d.  4.  Avandia 4 mg daily.  5.  Crestor 10 mg daily.  6.  Protonix 40 mg daily.  7.  Allopurinol 300 mg daily.  8.  Diovan 80 mg daily.  9.  Requip 1 mg daily.  10. Probenecid 500 mg q.12h.  11. Nitroglycerin as needed.   FOLLOWUP:  He will follow up with Dr. Andee Lineman in the [facility] office on Apr 18, 2005 at 12:15. At the time of follow-up with Dr. Andee Lineman, consideration  should be given to review the above-mentioned lipids and readjust his  medications for aggressive control of his hyperlipidemia. Also, aggressive  cardiac risk factor modifications should be pursued such as exercise, weight  loss, and glucose control. He is asked to follow up with Dr. Olena Leatherwood in  regards to his sugars and any other medical concerns.      EW/MEDQ  D:  04/03/2005  T:  04/03/2005  Job:  [id number]   cc:    Dr. Lavell [place], [place]   [name], M.D. [facility]

## 2011-04-06 NOTE — Op Note (Signed)
NAME:  [name], [name] NO.:  [id number]   MEDICAL RECORD NO.:  [id number]                   PATIENT TYPE:  AMB   LOCATION:  DAY                                  FACILITY:  [facility]   PHYSICIAN:  R. Roetta Sessions, M.D.              DATE OF BIRTH:  1935-01-09   DATE OF PROCEDURE:  07/19/2003  DATE OF DISCHARGE:                                 OPERATIVE REPORT   PROCEDURE:  Esophagogastroduodenoscopy with Elease Hashimoto dilation.   ENDOSCOPIST:  Gerrit Friends. Rourk, M.D.   INDICATIONS FOR PROCEDURE:  The patient is a 76 year old gentleman with  longstanding gastroesophageal reflux disease, noncardiac chest pain now with  recurrent esophageal dysphagia.  He tells me that Dr. Lajuana Ripple in [place]  performed the EGD and dilated his esophagus several years ago (reason he  dilated, not known).  This was associated with improvement in his dysphagia.  He has had recurrent dysphagia over the past few months. EGD is now being  done to further evaluate his symptoms.  This approach has been discussed  with the patient at length.  The potential risks, benefits, and alternatives have been reviewed;  questions answered.  He is agreeable.  Please see my dictated consultation  note of July 13, 2003 for more information.   PROCEDURE NOTE:  O2 saturation, blood pressure, pulse and respirations were  monitored throughout the entire procedure.  Conscious sedation: Versed 3 mg  IV, Demerol 75 mg IV in divided doses.   INSTRUMENT:  Olympus video chip adult gastroscope.   FINDINGS:  Examination of the tubular esophagus revealed a Schatzki ring.  The mucosa otherwise appeared normal.  No evidence of Barrett's esophagus.  The EG junction was easily traversed.   STOMACH:  The gastric cavity was empty.  It insufflated well with air.  A  thorough examination of the gastric mucosa including a retroflex view of the  proximal stomach and esophagogastric junction demonstrated only a small  hiatal hernia.  The pylorus was patent and easily traversed.   DUODENUM:  The bulb and the second portion appeared normal.   THERAPEUTIC/DIAGNOSTIC MANEUVERS:  A 56 French Maloney dilator was passed to  full insertion with slight resistance upon full insertion.  No blood  returned on the dilator.  A look back revealed no apparent complications  related to the passage of the dilator.   The patient tolerated the procedure well and was reacted in endoscopy.   IMPRESSION:  1. Schatzki ring otherwise normal esophagus status post dilation as     described above.  2. Small hiatal hernia otherwise normal stomach, D1 and D2.    RECOMMENDATIONS:  1. Continue Nexium 40 mg orally b.i.d.  2. The patient has never had a screening colonoscopy.  I have urged him to     get one. He is to call my office to schedule one between now and the end  of the year.  He is to follow up with Dr. Butch Penny.                                               Jonathon Bellows, M.D.    RMR/MEDQ  D:  07/19/2003  T:  07/19/2003  Job:  [id number]   cc:   Angus G. Renard Matter, M.D.  [address]  [place]  [place] [address]  Fax: [phone]   Alvira Philips, M.D.  [address]  [place] [address]  Fax: [phone]

## 2011-04-06 NOTE — Cardiovascular Report (Signed)
[name], [name] NO.:  [id number]   MEDICAL RECORD NO.:  [id number]          PATIENT TYPE:  INP   LOCATION:  2853                         FACILITY:  [facility]   PHYSICIAN:  Salvadore Farber, M.D. LHCDATE OF BIRTH:  1935-07-28   DATE OF PROCEDURE:  06/28/2005  DATE OF DISCHARGE:  06/28/2005                              CARDIAC CATHETERIZATION   PROCEDURE:  Left heart catheterization, left ventriculography, coronary  angiography, Starclose closure of the right common femoral arteriotomy site.   INDICATIONS FOR PROCEDURE:  [name] is a 76 year old gentleman status  post stenting of the posterior left ventricular branch of the circumflex by  Dr. Juanda Chance on Apr 02, 2005.  The patient has been concerned that his  exercise tolerance has remained limited after that procedure.  He is  referred for repeat cardiac catheterization.   PROCEDURE TECHNIQUE:  Informed consent was obtained.  Under 1% lidocaine  local anesthesia, a 5 French sheath was placed in the right femoral artery  using the modified Seldinger technique.  Diagnostic angiography and  ventriculography were performed using JL4, JR4, and pigtail catheters.  The  arteriotomy site was then closed using a Starclose device.  Complete  hemostasis was obtained.  He was transferred to the holding room in stable  condition having tolerated the procedure well.   COMPLICATIONS:  None.   FINDINGS:  1.  Left ventricle:  124/4/15.  EF approximately 65% without regional wall      motion abnormality.  2.  No aortic stenosis or mitral regurgitation.  3.  Left main:  Angiographically normal.  4.  LAD:  Moderate size vessel giving rise to no significant diagonal      branches.  It is angiographically normal.  5.  Ramus intermedius:  Moderate size vessel which is angiographically      normal.  6.  Circumflex:  Large, dominant vessel.  There is a 20% stenosis after the      take off of the first marginal.  There is a  widely patent stent in the      first posterior left ventricular branch.  7.  Right coronary artery:  Small nondominant vessel.  An RV marginal has a      99% stenosis.  This is unchanged compared with the images from May.   IMPRESSION/RECOMMENDATION:  No change compared to the images of Apr 02, 2005.  There is no evident cardiac explanation for his exertional dyspnea.  Specifically, it cannot be explained by his coronary disease or any heart  failure.  Intermittent arrhythmia cannot be excluded based on this study.  We will continue with exercise and follow up with his team of medical  providers in [place].       WED/MEDQ  D:  06/28/2005  T:  06/28/2005  Job:  [id number]

## 2011-04-06 NOTE — Op Note (Signed)
NAMETIVON, LEMOINE NO.:  [id number]   MEDICAL RECORD NO.:  [id number]          PATIENT TYPE:  OIB   LOCATION:  2899                         FACILITY:  [facility]   PHYSICIAN:  Duke Salvia, M.D.  DATE OF BIRTH:  Jan 02, 1935   DATE OF PROCEDURE:  07/16/2005  DATE OF DISCHARGE:                                 OPERATIVE REPORT   PREOPERATIVE DIAGNOSIS:  Recurrent syncope and presyncope with carotid sinus  hypersensitivity.   POSTOPERATIVE DIAGNOSIS:  Recurrent syncope and presyncope with carotid  sinus hypersensitivity.   PROCEDURE PERFORMED:  Dual chamber pacemaker implantation.   DESCRIPTION OF PROCEDURE:  Following the obtaining of informed consent, the  patient was brought to the electrophysiology laboratory and placed on the  fluoroscopic table in supine position.  After routine prep and drape of the  left upper chest, lidocaine was infiltrated in the prepectoral  supraclavicular region and incision was made and carried down to the layer  of the prepectoral fascia using electrocautery and sharp dissection.  A  pocket was formed similarly, hemostasis was obtained.  Thereafter attention  was turned to gaining access to the extrathoracic, left subclavian vein  which was accomplished without difficulty without the aspiration of  air or  puncture of the artery.  Two separate venipunctures were accomplished.  Guidewires were placed and retained. A 0 silk suture was placed  in a figure-  of-eight fashion and allowed to hang loosely.   Sequentially, 7 French tear-away introducer sheaths were placed through  which were passed sequentially a Medtronic 5076 58 cm active fixation  ventricular lead, serial number [id number] and a Medtronic 5076 52 cm active  fixation atrial lead, serial number [id number].  THE VENTRICULAR LEAD WAS  MARKED WITH A TIE.   These leads were then manipulated under fluoroscopic guidance to the right  ventricular apex and the right atrial  appendage respectively where the  bipolar R wave was 9.8 mV initially and then 17 with a pacing impedance of  1245 ohms with a threshold of 0.7 V at 0.5 msec, current at threshold 0.9 mA  and there was no diaphragmatic pacing at 10 V.   The bipolar P-wave was 2.6 mV with a pacing impedance of 738 ohms, a  threshold of 0.6 V at 0.5 msec.  Current at threshold was 1.1 mA.  With  these acceptable parameters recorded, the leads were secured with Medtronic  Kappa, [id number] pulse generator, serial number [id number].  We could not be  sure of either ventricular or atrial sensing, so the leads were separated  from the can again to make sure that they were functioning normally and then  replaced in the header.   The wound was then copiously irrigated with antibiotic containing saline  solution. Hemostasis was assured. The leads and the pulse generator were  placed in the pocket and secured to the prepectoral fascia.  The wound was  closed  in three layers in the  normal fashion.  The wound was washed and dried and  benzoin and Steri-Strip dressing was applied.  Sponge, needle and instrument  counts were  correct at the end of the procedure according to the staff.  The  patient tolerated the procedure without apparent complication.           ______________________________  Duke Salvia, M.D.     SCK/MEDQ  D:  07/16/2005  T:  07/16/2005  Job:  [id number]   cc:   Dr. Gilles Chiquito Device Cl   Electrophys lab

## 2011-04-06 NOTE — Discharge Summary (Signed)
NAME:  ELWIN, TSOU NO.:  [id number]   MEDICAL RECORD NO.:  [id number]          PATIENT TYPE:  OIB   LOCATION:  4703                         FACILITY:  [facility]   PHYSICIAN:  Duke Salvia, M.D.  DATE OF BIRTH:  Mar 16, 1935   DATE OF ADMISSION:  07/16/2005  DATE OF DISCHARGE:                                 DISCHARGE SUMMARY   ADDENDUM:  Discussion with patient and family and with Dr. Graciela Husbands regarding most recent  presyncopal episode here at [facility] on August 29 just prior to  his discharge as I had been dictating monitor had been taken off, IV had  just been taken out in preparation for him going home when he felt dizzy, he  got ashen, some labored breathing, and nauseated.  Blood pressure once again  fell to 89 systolic.  He was not on the monitor and heart rate was not  obtained at that time but obviously pacer with good function had been  implanted just the day before.  In discussion with the patient it is true he  has just had work-up for coronary artery disease and consonant with that had  been placed on metoprolol.  This is the newest medication for him and is  appropriate in someone with coronary artery disease.  He is taking 25 mg  b.i.d.  Patient does say that he feels his symptoms have increased since he  has been taking Lopressor.  Symptoms of presyncope and nausea.  It is  possible that the blood pressure amelioration effected by both Diovan 80 mg  and Lopressor 25 b.i.d. could be dropping his blood pressure during these  episodes into a symptomatic area.  Therefore, at discharge the patient will  be asked to hold his Lopressor 25 mg b.i.d. to see if this helps his  episodic and gives him some relief from these episodes as plan B would be  for him to hold both Lopressor and Diovan 80 mg daily and see if this gives  him some relief from the episodes and plan C is if he still has episodes to  call Dr. Andee Lineman in advance of the September 19  meeting.  This plan has been  discussed with the patient and his wife and they both seem willing to  proceed.  He is to take it easy the rest of this day as the morning dose of  Lopressor 25 mg washes out overlaid over Diovan 80 mg given this morning as  well.      Maple Mirza, P.A.    ______________________________  Duke Salvia, M.D.    GM/MEDQ  D:  07/17/2005  T:  07/17/2005  Job:  [id number]   cc:   [name], M.D. [facility]  [address]  [address]  [place]  [place] [address]

## 2011-04-06 NOTE — Discharge Summary (Signed)
NAMEASHUR, GLATFELTER NO.:  [id number]   MEDICAL RECORD NO.:  [id number]          PATIENT TYPE:  OIB   LOCATION:  4703                         FACILITY:  [facility]   PHYSICIAN:  Duke Salvia, M.D.  DATE OF BIRTH:  Oct 31, 1935   DATE OF ADMISSION:  07/16/2005  DATE OF DISCHARGE:  07/17/2005                                 DISCHARGE SUMMARY   DISCHARGE DIAGNOSES:  1.  Discharging date one status post implantation of Medtronic KAPPA dual-      chamber pacemaker with rate drop hysteresis.  2.  Carotid sinus hypersensitivity.  3.  History of presyncope and pauses related to #2.   SECONDARY DIAGNOSES:  1.  History of coronary artery disease status post stent in the      posterolateral branch of the left circumflex 5/06.  2.  Repeat left heart catheterization June 28, 2005.  The study showed      ejection fraction 65% with no regional wall motion abnormalities.  No      aortic stenosis.  No mitral regurgitation.  Left main angiographically      normal.  LAD is angiographically normal.  The ramus intermediate is a      moderate size and angiographically normal.  The left circumflex has a      20% stenosis after the takeoff of the first obtuse marginal.  There is a      widely patent stent in the first posterolateral branch.  The right      coronary artery has a right ventricular marginal with a 99% stenosis      unchanged compared to images from May.  The patient had complaint of      exertional dyspnea, however, no cardiac explanation for this exertional      dyspnea.  3.  Hypertension.  4.  Dyslipidemia.  5.  Diabetes.  6.  Obesity.  7.  History of intolerance to nitroglycerin.  8.  ALLERGY TO SHRIMP.  9.  Esophageal stricture and status post dilatation.  10. Restless leg syndrome.  11. Possible obstructive sleep apnea.  12. Family history of premature coronary artery disease.  13. Gout.   PROCEDURE:  July 16, 2005 implantation of KAPPA Medtronic  dual-chamber  pacemaker by Dr. Sherryl Manges.   HOSPITAL COURSE:  The patient tolerated the procedure well and has had no  post procedural complications.  His incision looks well.  There is no  evidence of erythema, drainage or swelling.  His pain is well-controlled  with oral analgesia.  The patient has been afebrile.  He will be discharged  on the following medications.   DISCHARGE MEDICATIONS:  1.  Tylenol 325 mg 1-2 tabs every 4-6 hours.  2.  Diovan 80 mg daily.  3.  Allopurinol 300 mg daily.  4.  Plavix 75 mg daily.  5.  Enteric-coated aspirin 81 mg daily.  6.  Protonix 40 mg daily.  7.  Avandia 4 mg daily.  8.  Crestor 10 mg daily at bedtime.  9.  Requip 1 mg twice daily.  10. Probenecid 500 mg twice daily.  11. Lopressor 50 mg tablets one half tablet twice daily.   DISCHARGE INSTRUCTIONS:  His diet is a low sodium, low cholesterol diabetic  diet.  He is asked not to drive for the next week nor to engage in any heavy  lifting for the next two weeks.  He is asked to keep his incision dry for  the next seven days.  To sponge bathe until Monday, July 23, 2005.  As  followup he will see the [facility] at [facility], [address], July 25, 2005 at 10 o'clock in the morning.  He will see Dr.  Andee Lineman Wednesday, August 15, 2005 at 1 p.m. and he will see Dr. Graciela Husbands  November.  Dr. Odessa Fleming office will call with that appointment.   HISTORY OF PRESENT ILLNESS:  Mr. Domagalski is a 76 year old gentleman.  He has  a history of ischemic heart disease.  He is status post stenting of the left  posterior ventricular branch of the circumflex by Dr. Juanda Chance May 2006.  He  has had continued exercise intolerance and he underwent repeat  catheterization on June 28, 2005 demonstrating patency of the stent site.  There is a high-grade stenosis of __________ marginal which is unchanged.  Ejection fraction is normal.  Medical therapy continues.  His major  complaint has been  repeated episodes of presyncope associated with shortness  of breath.  These can be abrupt.  They can occur at church when he wears a  shirt and tie.  Carotid Doppler's have been ordered.  When the carotid probe  was applied the patient became light-headed and had a significant pause.  He  saw Dr. Andee Lineman in the office July 11, 2005.  The maneuver was repeated  with a significant loss of pulse again detected.  He also had presyncopal  symptoms and shortness of breath.  The patient has carotid sinus  hypersensitivity with syncope.  One element of this is cardiac inhibition  with bradycardia.  The second is vasodepression secondary to sympathetic  withdrawal.  Pacing will help the first but may not have an effect on the  second.  We will plan to proceed with a pacer and further therapy directed  at eliminating vasodepression would be in order if symptoms continue.   HOSPITAL COURSE:  The patient presented to electively to [facility]  on July 16, 2005.  He underwent implantation of Medtronic KAPPA pacemaker.  He has had no complications on discharging day one.  He was discharged with  the medication and to followup as shown above.  Mobility of the left arm has  been discussed with the patient.  The patient's device has been interrogated  with all values within normal limits.      Maple Mirza, P.A.    ______________________________  Duke Salvia, M.D.    GM/MEDQ  D:  07/17/2005  T:  07/17/2005  Job:  [id number]   cc:   Duke Salvia, M.D.  [address]  [address]  [place]  [place] [address]   [name], M.D. [facility]  [address]  [address]  [place]  [place] [address]   Annette Stable Hasanaj  [address]  [place]  [place] [address]  Fax: [phone]

## 2011-04-06 NOTE — Consult Note (Signed)
NAME:  TRACY, KINNER NO.:  [id number]   MEDICAL RECORD NO.:  [id number]                   PATIENT TYPE:   LOCATION:                                       FACILITY:   PHYSICIAN:  R. Roetta Sessions, M.D.              DATE OF BIRTH:  02-01-1935   DATE OF CONSULTATION:  07/13/2003  DATE OF DISCHARGE:  07/11/2003                                   CONSULTATION   PRIMARY CARE PHYSICIAN:  Angus G. Renard Matter, M.D.   EMERGENCY ROOM PHYSICIAN:  Alvira Philips, M.D.   REASON FOR CONSULTATION:  Esophageal dysphagia.   HISTORY OF PRESENT ILLNESS:  Mr. Keegan Ducey is a pleasant 76 year old  obese gentleman with type 2 diabetes mellitus.  He presented to the  emergency department a couple of days ago with worsening of esophageal  dysphagia and reflux symptoms.  He has had a two-month history of  intermittent esophageal dysphagia.  Reflux symptoms have previously been  controlled on Nexium 40 mg orally daily but he did have recurrence.  He was  seen by Dr. Ernestina Penna.  Cardiac workup was negative.  He got relief with a GI  cocktail.  He followed up with D. McInnis and was sent over here.   Mr. Dia has a prior history of esophageal dysphagia and saw Dr. Claudette Head some six to seven years ago.  He describes undergoing an EGD with  esophageal dilation and this was associated with relief of dysphagia until  the past few months.  He has not had any abdominal pain and has not had any  further chest pain.  He continues taking Nexium 40 mg once daily which was  actually increased to b.i.d. two days ago.  He also is taking Carafate 1  gram q.i.d.  He has not had any melena.  He has occasional small volume  rectal bleeding.  He has never had a colonoscopy.  He has alternating  constipation and diarrhea.  There is no family history of colorectal  neoplasia.  He apparently was on Crestor for his cholesterol recently and  labs revealed his ALT was up at 125 through Dr.  Lorenso Courier office; this  medication was stopped.  He does not consume alcohol or tobacco products.   PAST MEDICAL HISTORY:  1. GERD.  2. Esophageal dysphagia.  3. He has history of chronic back pain for which he was given hydrocodone     recently and it made him feel swimmy-headed.  He stopped this on his     own.  He wants something for back pain.  4. History of hypertension.  5. History of type 2 diabetes mellitus.  6. Gout.   PAST SURGERY:  1. Right arm surgery related to trauma in 1986.  2. EGD with dilation - Dr. Russella Dar, [place], several years ago.   CURRENT MEDICATIONS:  1. Diovan 80 mg daily.  2. Avandia 4  mg daily.  3. Nexium 40 mg b.i.d.  4. Allopurinol 300 mg daily.  5. Probenecid 500 mg b.i.d.  6. Carafate 1 gram q.i.d.  7. Hydrocodone - stopped.  8. Aspirin 81 mg daily.   ALLERGIES:  NITROGLYCERIN.   FAMILY HISTORY:  Mother had diabetes.  Father died with an MI.  No history  of chronic GI or liver illness.   SOCIAL HISTORY:  The patient has been married for 24 years, has three grown  sons in good health.  He is a Engineer, materials at [employer].  No  tobacco or alcohol.   REVIEW OF SYSTEMS:  No recent chest pain on exertion.  He has gained 15  pounds over the past two years.  He has not had any fever or chills.   PHYSICAL EXAMINATION:  GENERAL:  Reveals a pleasant 76 year old obese  gentleman, somewhat hard-of-hearing, accompanied by his wife.  VITAL SIGNS:  Weight 274, height 6 feet 1 inch.  Temperature 100.4, blood  pressure 130/90, pulse 88.  SKIN:  Warm and dry.  There no jaundice, no cutaneous stigmata of chronic  liver disease.  HEENT:  No scleral icterus. Conjunctivae are pink. Oral cavity no lesions.  He has a partial below and false teeth above.  JVD is not prominent.  CHEST:  Lungs are clear to auscultation.  CARDIAC:  Regular rate and rhythm without murmur, gallop, rub.  ABDOMEN:  Obese, positive bowel sounds.  He does have some right  upper  quadrant tenderness to palpation.  No appreciable mass or organomegaly.  Again, he does have localized right upper quadrant tenderness.  EXTREMITIES:  No edema.  RECTAL:  Deferred until time of colonoscopy.   IMPRESSION:  Mr. Heath Tesler is a 76 year old gentleman with a three- to  four-month history of recurrent esophageal dysphagia and reflux symptoms.  By report he presented to the emergency department with some chest  discomfort.  Cardiac evaluation was negative.  He did get relief with a GI  cocktail.  He does have a low-grade temperature today and has right upper  quadrant tenderness on physical examination.   He previously underwent a dilation of his esophagus by Dr. Russella Dar several  years ago.  It is unclear whether he had a ring and/or a stricture.  He  needs to have an EGD now.  I discussed the approach of EGD with probable  esophageal dilation as appropriate with Mr. Kiester and his wife.  Potential  risks, benefits, and alternatives have been reviewed, questions answered,  and he is agreeable.  As a separate issue at some point in time for his  small volume rectal bleeding and altered bowel habits (and the fact he has  never had a colonoscopy) he ought to have a colonoscopy.  Will hold off  until we deal with his upper GI tract symptoms.   As far as his right upper quadrant abdominal pain is concerned, will  consider ultrasound of the gallbladder pending results of EGD.  It is  notable that he had an elevated ALT of 125 on a recent blood work through  Dr. Lorenso Courier  office.  I agree with holding Crestor.  He ought to have a liver profile as  a separate issue in a couple of weeks.  Further recommendations to follow.   I would to like Dr. Joaquin Bend and Dr. Butch Penny for allowing me to  see this nice gentleman today.  Jonathon Bellows, M.D.   RMR/MEDQ  D:  07/13/2003  T:  07/13/2003  Job:  [id number]   cc:   Alvira Philips, M.D.  [address]  [place] [address]  Fax: [phone]   Angus G. Renard Matter, M.D.  [address]  [place]  [place] [address]  Fax: [phone]

## 2011-04-26 ENCOUNTER — Encounter: Payer: Self-pay | Admitting: *Deleted

## 2011-04-27 ENCOUNTER — Ambulatory Visit (INDEPENDENT_AMBULATORY_CARE_PROVIDER_SITE_OTHER): Payer: Medicare Other | Admitting: Cardiology

## 2011-04-27 ENCOUNTER — Encounter: Payer: Self-pay | Admitting: Cardiology

## 2011-04-27 VITALS — BP 148/90 | HR 78 | Ht 73.0 in | Wt 298.0 lb

## 2011-04-27 DIAGNOSIS — Z01810 Encounter for preprocedural cardiovascular examination: Secondary | ICD-10-CM

## 2011-04-27 DIAGNOSIS — G9001 Carotid sinus syncope: Secondary | ICD-10-CM

## 2011-04-27 NOTE — Assessment & Plan Note (Signed)
Status post pacemaker for carotid sinus hypersensitivity and sick sinus syndrome.: I provided his orthopedic surgeons with the phone number of [employer] so that they can interrogate his device after surgery. During the surgery a magnet should be applied to the device or the [employer] representative can  deactivate the sensing function

## 2011-04-27 NOTE — Assessment & Plan Note (Signed)
The patient has been given preoperative clearance from a cardiac standpoint. His last catheterization was in June of 2010 and a patent circumflex stent and otherwise nonobstructive coronary artery disease. He is currently not on Plavix and therefore this is not an issue preoperatively. Aspirin should be continued if at all possible and the patient will need appropriate DVT prophylaxis. Otherwise from a cardiac standpoint the patient is at low risk for any perioperative cardiovascular complications and further testing would not alter this risk.

## 2011-04-27 NOTE — Patient Instructions (Signed)
Continue all current medications. Your physician wants you to follow up in: 6 months.  You will receive a reminder letter in the mail one-two months in advance.  If you don't receive a letter, please call our office to schedule the follow up appointment   

## 2011-04-27 NOTE — Progress Notes (Signed)
HPI The patient is a 76 year old male with history of coronary artery disease and multiple cardiac risk factors. Earlier this year while at the orthopedics office. The patient was noted to have a tonic-clonic seizure. He was evaluated by neurology. Because of the presence of a pacemaker an MRI could not be done. He had a CT scan of the head which showed no acute abnormalities. Because this was a one-time seizure, neurology felt the patient should not be started on anticonvulsant therapy. From a cardiology perspective the patient had a cardiac catheterization done in June of 2010. He had a patent circumflex stent and otherwise nonobstructive coronary artery disease. He also had normal LV function with normal intracardiac pressures arguing against diastolic heart failure. The patient does have a history of chronic dyspnea which is felt to be secondary to deconditioning and weight gain. The patient does have a history of sick sinus syndrome and carotid hypersensitivity. The patient is status post Pacemaker placement with a Medtronic kappa device. He has a history of vertigo consistent with benign positional vertigo. During his last office visit the patient was referred to Margretta Ditty  to learn repositioning techniques. Was also given a prescription of meclizine. The patient now scheduled for knee surgery possibly for future bilateral knee replacements with a date set for 05/22/2011 was a long hospital. The patient is also seen for preoperative clearance. From a cardiac standpoint has been doing well. He reports no chest pain or shortness of breath. He has no orthopnea or PND but is very limited in his exercise tolerance due to degenerative knee disease.  Allergies  Allergen Reactions  . Nitroglycerin     REACTION: BP decrease    Current Outpatient Prescriptions on File Prior to Visit  Medication Sig Dispense Refill  . allopurinol (ZYLOPRIM) 300 MG tablet Take 300 mg by mouth daily.        Marland Kitchen  aspirin 81 MG tablet Take 81 mg by mouth daily.        . fenofibrate (TRICOR) 145 MG tablet Take 145 mg by mouth daily.        . fish oil-omega-3 fatty acids 1000 MG capsule Take 3 g by mouth daily.        . furosemide (LASIX) 20 MG tablet Take 20 mg by mouth daily as needed.        . meclizine (ANTIVERT) 25 MG tablet Take 25 mg by mouth 3 (three) times daily as needed.        . metoprolol (LOPRESSOR) 50 MG tablet Take 50 mg by mouth daily.        Marland Kitchen omeprazole (PRILOSEC) 40 MG capsule Take 40 mg by mouth daily.        . probenecid (BENEMID) 500 MG tablet Take 500 mg by mouth daily.       . saxagliptin HCl (ONGLYZA) 5 MG TABS tablet Take 5 mg by mouth daily.          Past Medical History  Diagnosis Date  . Diabetes mellitus   . Hypertension   . Carotid sinus syndrome   . Other and unspecified hyperlipidemia   . Unspecified essential hypertension   . Shortness of breath   . Chest pain, unspecified   . Atherosclerosis of aorta   . Type II or unspecified type diabetes mellitus without mention of complication, not stated as uncontrolled   . Obstructive sleep apnea      CPAP    Past Surgical History  Procedure Date  . Pacemaker implanted  07/16/2005    Family History  Problem Relation Age of Onset  . Diabetes      FAMILY HISTORY  . Hypertension      FAMILY HISTORY    History   Social History  . Marital Status: Married    Spouse Name: N/A    Number of Children: N/A  . Years of Education: N/A   Occupational History  . Not on file.   Social History Main Topics  . Smoking status: Never Smoker   . Smokeless tobacco: Never Used   Comment:   Smoking Status: never  . Alcohol Use: No  . Drug Use: No  . Sexually Active: Not on file   Other Topics Concern  . Not on file   Social History Narrative  . No narrative on file   [id number] positives as outlined above. The remainder of the 18  point review of systems is negative   PHYSICAL EXAM BP 148/90  Pulse 78  Ht  6\' 1"  (1.854 m)  Wt 298 lb (135.172 kg)  BMI 39.32 kg/m2  SpO2 94%  General: Well-developed, well-nourished in no distress Head: Normocephalic and atraumatic Eyes:PERRLA/EOMI intact, conjunctiva and lids normal Ears: No deformity or lesions Mouth:normal dentition, normal posterior pharynx Neck: Supple, no JVD.  No masses, thyromegaly or abnormal cervical nodes Lungs: Normal breath sounds bilaterally without wheezing.  Normal percussion Cardiac: regular rate and rhythm with normal S1 and S2, no S3 or S4.  PMI is normal.  No pathological murmurs Abdomen: Normal bowel sounds, abdomen is soft and nontender without masses, organomegaly or hernias noted.  No hepatosplenomegaly MSK: Back normal, difficulty with gait and degenerative joint disease of the knees. Vascular: Pulse is normal in all 4 extremities Extremities: No peripheral pitting edema Neurologic: Alert and oriented x 3 Skin: Intact without lesions or rashes Lymphatics: No significant adenopathy Psychologic: Normal affect   ECG:NA  ASSESSMENT AND PLAN

## 2011-05-16 ENCOUNTER — Other Ambulatory Visit: Payer: Self-pay | Admitting: Orthopedic Surgery

## 2011-05-16 ENCOUNTER — Encounter (HOSPITAL_COMMUNITY): Payer: Medicare Other

## 2011-05-16 ENCOUNTER — Ambulatory Visit (HOSPITAL_COMMUNITY)
Admission: RE | Admit: 2011-05-16 | Discharge: 2011-05-16 | Disposition: A | Discharge status: Home / Self Care | Payer: Medicare Other | Source: Ambulatory Visit | Attending: Orthopedic Surgery | Admitting: Orthopedic Surgery

## 2011-05-16 ENCOUNTER — Other Ambulatory Visit (HOSPITAL_COMMUNITY): Payer: Self-pay | Admitting: Orthopedic Surgery

## 2011-05-16 DIAGNOSIS — M1710 Unilateral primary osteoarthritis, unspecified knee: Secondary | ICD-10-CM | POA: Insufficient documentation

## 2011-05-16 DIAGNOSIS — Z79899 Other long term (current) drug therapy: Secondary | ICD-10-CM | POA: Insufficient documentation

## 2011-05-16 DIAGNOSIS — Z01818 Encounter for other preprocedural examination: Secondary | ICD-10-CM

## 2011-05-16 DIAGNOSIS — Z01812 Encounter for preprocedural laboratory examination: Secondary | ICD-10-CM | POA: Insufficient documentation

## 2011-05-16 DIAGNOSIS — Z7982 Long term (current) use of aspirin: Secondary | ICD-10-CM | POA: Insufficient documentation

## 2011-05-16 DIAGNOSIS — I10 Essential (primary) hypertension: Secondary | ICD-10-CM | POA: Insufficient documentation

## 2011-05-16 DIAGNOSIS — E119 Type 2 diabetes mellitus without complications: Secondary | ICD-10-CM | POA: Insufficient documentation

## 2011-05-16 LAB — URINALYSIS, ROUTINE W REFLEX MICROSCOPIC
Glucose, UA: NEGATIVE mg/dL
Hgb urine dipstick: NEGATIVE
Ketones, ur: NEGATIVE mg/dL
Protein, ur: NEGATIVE mg/dL

## 2011-05-16 LAB — BASIC METABOLIC PANEL
CO2: 29 mEq/L (ref 19–32)
GFR calc non Af Amer: 44 mL/min — ABNORMAL LOW (ref >60)
Glucose, Bld: 73 mg/dL (ref 70–99)
Potassium: 4.4 mEq/L (ref 3.5–5.1)
Sodium: 140 mEq/L (ref 135–145)

## 2011-05-16 LAB — CBC
HCT: 46.7 % (ref 39.0–52.0)
Hemoglobin: 15.8 g/dL (ref 13.0–17.0)
MCH: 31.7 pg (ref 26.0–34.0)
MCHC: 33.8 g/dL (ref 30.0–36.0)

## 2011-05-16 LAB — DIFFERENTIAL
Lymphocytes Relative: 21 % (ref 12–46)
Monocytes Absolute: 0.6 10*3/uL (ref 0.1–1.0)
Monocytes Relative: 9 % (ref 3–12)
Neutro Abs: 4.7 10*3/uL (ref 1.7–7.7)

## 2011-05-16 LAB — PROTIME-INR: Prothrombin Time: 13.3 seconds (ref 11.6–15.2)

## 2011-05-16 LAB — ABO/RH: ABO/RH(D): A POS

## 2011-05-16 LAB — SURGICAL PCR SCREEN
MRSA, PCR: NEGATIVE
Staphylococcus aureus: NEGATIVE

## 2011-05-21 NOTE — H&P (Signed)
NAME:  [name], [name]                ACCOUNT NO.:  [id number]  MEDICAL RECORD NO.:  LOCATION:                                 FACILITY:  PHYSICIAN:  Madlyn Frankel. Charlann Boxer, M.D.  DATE OF BIRTH:  May 13, 1935  DATE OF ADMISSION: DATE OF DISCHARGE:                             HISTORY & PHYSICAL   DATE OF SURGERY:  May 22, 2011.  ADMITTING DIAGNOSIS:  Osteoarthritis, right knee.  HISTORY OF PRESENT ILLNESS:  This is a 76 year old gentleman with history of osteoarthritis of both knees, right greater than left with failure of conservative treatment for his right knee.  He is now scheduled for a total knee arthroplasty of the right knee.  The surgery risk, benefits and aftercare were discussed in detail with the patient. Questions invited and answered.  He has coronary artery disease with previous stent.  He is not a candidate for tranexamic acid and will not receive that at surgery.  He is given his home medicines of aspirin, Robaxin, iron, MiraLax and Colace for postop DVT prophylaxis.  He does have a pacemaker which will need to be magneted at surgery.  His cardiologist is Dr. Andee Lineman.  He may wind up going to a nursing home postoperatively if he does not ambulate well and get up on his own well in the hospital as his wife cannot really help him get up and down due to his size.  PAST MEDICAL HISTORY:  Drug allergy to NITROGLYCERIN with decreased blood pressure.  CURRENT MEDICATIONS: 1. Allopurinol 300 mg daily. 2. Aspirin 81 mg daily. 3. Valium 2 mg 1 b.i.d. 4. TriCor 145 mg 1 daily. 5. Fish oil 1000 mg 3 g daily. 6. Lasix 20 mg daily. 7. Antivert 25 mg t.i.d. p.r.n. dizziness. 8. Lopressor 50 mg daily. 9. Omeprazole 40 mg daily. 10.Probenecid 500 mg daily. 11.Onglyza 5 mg daily. 12.Zocor 20 mg daily.  Medical illnesses include history of gout, hyperlipidemia, vertigo, coronary artery disease with previous MI with stent, reflux and diabetes.  PREVIOUS SURGERIES:  Stent  pacemaker and biceps tendon repair on the right.  FAMILY HISTORY:  Positive for coronary artery disease and COPD.  SOCIAL HISTORY:  The patient is married.  He lives at home.  He does not smoke and does not drink.  REVIEW OF SYSTEMS:  CENTRAL NERVOUS SYSTEM:  Positive for vertigo and impaired hearing.  PULMONARY:  Positive for sleep apnea and he uses a CPAP machine.  He will need to bring his mask with him to the hospital. CARDIOVASCULAR:  Positive for pacemaker and heart stenting.  Negative for chest pain or palpitation.  GI:  Negative for ulcers or hepatitis. Positive for reflux.  GU:  Negative.  MUSCULOSKELETAL:  Positive in HPI.  PHYSICAL EXAMINATION:  VITAL SIGNS:  BP 116/80, respirations 12, pulse 68 and regular. GENERAL APPEARANCE:  This is an obese gentleman in no acute distress. HEENT:  Head normocephalic.  Nose patent.  Ears patent.  Pupils are equal, round and reactive to light.  Throat without injection. NECK: Supple without adenopathy.  Carotids are 2+ without bruit. CHEST:  Clear to auscultation.  No rales or rhonchi.  Respirations 12. HEART:  Regular rate and rhythm  at 68 beats per minute without murmur. He does have a pacemaker. ABDOMEN:  Soft with active bowel sounds.  No masses, organomegaly. NEUROLOGIC:  The patient is alert, oriented to time, place and person. Cranial nerves II-XII grossly intact. EXTREMITIES:  Right knee with decreased range of motion and pain. Neurovascular status intact.  IMPRESSION:  Right knee osteoarthritis.  PLAN:  Right total knee arthroplasty.     Jaquelyn Bitter. Chabon, P.A.   ______________________________ Madlyn Frankel Charlann Boxer, M.D.    SJC/MEDQ  D:  05/16/2011  T:  05/17/2011  Job:  [id number]  Electronically Signed by Jodene Nam P.A. on 05/17/2011 04:58:47 PM Electronically Signed by Durene Romans M.D. on 05/21/2011 09:28:39 AM

## 2011-05-22 ENCOUNTER — Inpatient Hospital Stay (HOSPITAL_COMMUNITY)
Admission: RE | Admit: 2011-05-22 | Discharge: 2011-05-25 | DRG: 470 | Disposition: A | Discharge status: Skilled Nursing Facility | Payer: Medicare Other | Source: Ambulatory Visit | Attending: Orthopedic Surgery | Admitting: Orthopedic Surgery

## 2011-05-22 DIAGNOSIS — I252 Old myocardial infarction: Secondary | ICD-10-CM

## 2011-05-22 DIAGNOSIS — E7800 Pure hypercholesterolemia, unspecified: Secondary | ICD-10-CM | POA: Diagnosis present

## 2011-05-22 DIAGNOSIS — K219 Gastro-esophageal reflux disease without esophagitis: Secondary | ICD-10-CM | POA: Diagnosis present

## 2011-05-22 DIAGNOSIS — I2510 Atherosclerotic heart disease of native coronary artery without angina pectoris: Secondary | ICD-10-CM | POA: Diagnosis present

## 2011-05-22 DIAGNOSIS — G4733 Obstructive sleep apnea (adult) (pediatric): Secondary | ICD-10-CM | POA: Diagnosis present

## 2011-05-22 DIAGNOSIS — E119 Type 2 diabetes mellitus without complications: Secondary | ICD-10-CM | POA: Diagnosis present

## 2011-05-22 DIAGNOSIS — Z01812 Encounter for preprocedural laboratory examination: Secondary | ICD-10-CM

## 2011-05-22 DIAGNOSIS — I10 Essential (primary) hypertension: Secondary | ICD-10-CM | POA: Diagnosis present

## 2011-05-22 DIAGNOSIS — Z9861 Coronary angioplasty status: Secondary | ICD-10-CM

## 2011-05-22 DIAGNOSIS — M1710 Unilateral primary osteoarthritis, unspecified knee: Principal | ICD-10-CM | POA: Diagnosis present

## 2011-05-22 DIAGNOSIS — Z950 Presence of cardiac pacemaker: Secondary | ICD-10-CM

## 2011-05-22 LAB — TYPE AND SCREEN

## 2011-05-22 LAB — GLUCOSE, CAPILLARY

## 2011-05-23 LAB — CBC
HCT: 39 % (ref 39.0–52.0)
Hemoglobin: 12.8 g/dL — ABNORMAL LOW (ref 13.0–17.0)
MCHC: 32.8 g/dL (ref 30.0–36.0)
RBC: 4.11 MIL/uL — ABNORMAL LOW (ref 4.22–5.81)

## 2011-05-23 LAB — GLUCOSE, CAPILLARY
Glucose-Capillary: 90 mg/dL (ref 70–99)
Glucose-Capillary: 137 mg/dL — ABNORMAL HIGH (ref 70–99)
Glucose-Capillary: 135 mg/dL — ABNORMAL HIGH (ref 70–99)
Glucose-Capillary: 93 mg/dL (ref 70–99)

## 2011-05-23 LAB — BASIC METABOLIC PANEL
BUN: 16 mg/dL (ref 6–23)
CO2: 29 mEq/L (ref 19–32)
Chloride: 100 mEq/L (ref 96–112)
Glucose, Bld: 132 mg/dL — ABNORMAL HIGH (ref 70–99)
Potassium: 4 mEq/L (ref 3.5–5.1)

## 2011-05-24 LAB — CBC
HCT: 37 % — ABNORMAL LOW (ref 39.0–52.0)
Hemoglobin: 12.3 g/dL — ABNORMAL LOW (ref 13.0–17.0)
MCHC: 33.2 g/dL (ref 30.0–36.0)
MCV: 94.1 fL (ref 78.0–100.0)
RDW: 13.7 % (ref 11.5–15.5)

## 2011-05-24 LAB — BASIC METABOLIC PANEL
BUN: 14 mg/dL (ref 6–23)
Creatinine, Ser: 1.37 mg/dL — ABNORMAL HIGH (ref 0.50–1.35)
GFR calc Af Amer: >60 mL/min (ref >60)
GFR calc non Af Amer: 51 mL/min — ABNORMAL LOW (ref >60)
Glucose, Bld: 111 mg/dL — ABNORMAL HIGH (ref 70–99)

## 2011-05-24 LAB — GLUCOSE, CAPILLARY: Glucose-Capillary: 130 mg/dL — ABNORMAL HIGH (ref 70–99)

## 2011-05-25 LAB — GLUCOSE, CAPILLARY
Glucose-Capillary: 149 mg/dL — ABNORMAL HIGH (ref 70–99)
Glucose-Capillary: 156 mg/dL — ABNORMAL HIGH (ref 70–99)
Glucose-Capillary: 131 mg/dL — ABNORMAL HIGH (ref 70–99)

## 2011-05-28 NOTE — Discharge Summary (Signed)
NAMEVALENTE, FOSBERG NO.:  [id number]  MEDICAL RECORD NO.:  [id number]  LOCATION:  1617                         FACILITY:  [facility]  PHYSICIAN:  Madlyn Frankel. Charlann Boxer, M.D.  DATE OF BIRTH:  02-24-1935  DATE OF ADMISSION:  05/22/2011 DATE OF DISCHARGE:  05/25/2011                        DISCHARGE SUMMARY - REFERRING   DISCHARGE CONDITION:  Stable.  DISCHARGE PLAN:  To nursing facility.  ADMITTING DIAGNOSIS:  Right knee osteoarthritis.  DISCHARGE DIAGNOSES: 1. Right knee osteoarthritis, status post right total knee replacement     on May 22, 2011. 2. Diabetes. 3. Gout. 4. Hypercholesterolemia. 5. Hypertension. 6. Reflux disease. 7. Obesity.  ADMITTING HISTORY:  Mr. Kolar is a 76 year old gentleman with multiple medical conditions who presented to the office with advanced bilateral knee osteoarthritis, had failed conservative measures, given the significant reduction, quality of life, after reviewing risks and benefits of the procedure, he would like to proceed with knee replacement in a staged fashion.  Plan would be for the routine hospital stay, discharged to home versus nursing facility and discussed in the office.  HOSPITAL COURSE:  The patient admitted for same-day surgery on May 22, 2011, he underwent an uncomplicated right total knee replacement.  He was taken to the recovery room and then subsequently to orthopedic ward remained for his 3-day hospital stay.  After reviewing with his wife his current situation, the plan was to go to nursing facility due to limited help at home and some of his decreased mobility noted in the hospital. He was seen and evaluated by Physical Therapy, he was weightbearing as tolerated, and progressed slower than he anticipated.  There were no postoperative complications, not requiring transfusions.  Hemovac drain and Foley catheter was removed on postoperative day #1 without issue, he was placed on a diabetic diet, his  electrolytes remained stable.  His preoperative creatinine was 1.55, by day #2 in the hospital was 1.37, most likely with some extra hydration.  His hematocrit remained stable on postop day #2 of 37.  His right knee was clean and dry throughout his hospital stay, no complications.  DISCHARGE INSTRUCTIONS:  The patient would be discharged to skilled nursing facility where he would be seen and evaluated by Physical Therapy, would be weightbearing as tolerated, working on range of motion, strengthening, gait training, encouraging activity.  He currently has an Aquacel dressing that will remain in place until May 30, 2011, at which point it can be removed and dry gauze applied, he can otherwise shower with this over top of this as it is waterproof. He should be on a diabetic diet while on the facility.  He will work aggressively with therapy to improve his overall strength and activity.  DISCHARGE MEDICATIONS: 1. Xarelto 10 mg p.o. daily for 10 days for blood thinning. 2. Colace 100 mg p.o. b.i.d. as needed for constipation while on pain     medicine. 3. Robaxin 500 mg every 6 hours as needed for muscle spasm and pain. 4. Norco 7.5/325, 1-2 tablets every 4-6 hours as needed for pain. 5. MiraLax 17 g p.o. twice a day for constipation. 6. Allopurinol 300 mg q.a.m. 7. He will resume aspirin 81 mg after  the Xarelto. 8. Diazepam 2 mg b.i.d. 9. Fish oil 1000 mg daily. 10.Amaryl 2 mg q.a.m. 11.Lasix 20 mg q.a.m. 12.Meclizine 25 mg t.i.d. 13.Metoprolol 50 mg q.a.m. 14.Omeprazole 40 mg q.a.m. 15.Onglyza 5 mg q.a.m. 16.Probenecid 500 mg b.i.d. 17.Simvastatin 20 mg daily. 18.TriCor 145 mg q.a.m.  Questions encouraged and was reviewed at the time of discharge, was seen back in the office in 2 weeks.  He will return to see Dr. Durene Romans at [facility], [phone] in 2 weeks' time.     Madlyn Frankel Charlann Boxer, M.D.     MDO/MEDQ  D:  05/25/2011  T:  05/25/2011  Job:   [id number]  Electronically Signed by Durene Romans M.D. on 05/28/2011 09:15:20 AM

## 2011-05-28 NOTE — Op Note (Signed)
NAMEKIOWA, HOLLAR NO.:  [id number]  MEDICAL RECORD NO.:  [id number]  LOCATION:  1617                         FACILITY:  [facility]  PHYSICIAN:  Madlyn Frankel. Charlann Boxer, M.D.  DATE OF BIRTH:  1935-07-19  DATE OF PROCEDURE:  05/22/2011 DATE OF DISCHARGE:                              OPERATIVE REPORT   PREOPERATIVE DIAGNOSES: 1. Right knee osteoarthritis. 2. Diabetes.  POSTOPERATIVE DIAGNOSES: 1. Right knee osteoarthritis. 2. Diabetes.  PROCEDURE:  Right total knee replacement utilizing gentamicin- impregnated cement, DePuy component size 5 femur, 5 tibia, 12.5 insert, and 41 patellar button.  SURGEON:  Madlyn Frankel. Charlann Boxer, MD  ASSISTANT:  Surgical team.  ANESTHESIA:  Spinal.  SPECIMENS:  None.  COMPLICATIONS:  None.  DRAINS:  One Hemovac.  TOURNIQUET TIME:  41 minutes at 250 mmHg.  INDICATIONS OF PROCEDURE:  Mr. Tomberlin is a 76 year old gentleman who had presented to the office for evaluation of bilateral knee pain. Radiographs revealed end-stage degenerative changes.  Following the failure of conservative treatment, he wished at this point to proceed with more definitive measures of knee arthroplasty.  Risks and benefits were discussed including infection given his diabetes, postop course and expectations, potential need for revision surgery including manipulation as well as the postop course.  Consent was obtained for benefit of pain relief.  PROCEDURE IN DETAIL:  The patient was brought to the operative theater. Once adequate anesthesia, preoperative antibiotics, Ancef administered, the patient was positioned supine, the right thigh tourniquet was placed.  The right lower extremity was then prepped and draped in sterile fashion with the right foot placed in [facility] leg holder.  Time- out was performed identifying the patient, planned procedure, and extremity.  The leg was exsanguinated, tourniquet elevated to 250 mmHg.  A midline incision was made  followed by median arthrotomy.  Following initial exposure, attention was directed to the patella, precut measurement was 26 mm.  I resected down to 14-15 mm and used a 41 patellar button to restore height.  The lug holes were drilled and a metal shim placed.  The attention was now directed to the femur.  Femoral canal was opened with a drill, irrigated to try to prevent fat emboli.  An intramedullary rod was passed to 5 degrees of valgus, 11 mm bone was resected off the distal femur due to preoperative flexion contracture and possible need for a size 6 component.  Following this resection, the tibia subluxated anteriorly.  Using extramedullary guide, I measured resection of 10 mm of the proximal lateral tibia was made.  I confirmed that the cut was perpendicular in the coronal plane as well as confirming that the gap was stable both medially and laterally with a 10-mm insert.  At this point, I sized the femur to be a size 5.  The size 5 rotation block was pinned into position anterior reference using a C-clamp to set rotation.  A 4:1 cutting block was then pinned into position.  Anterior and posterior chamfer cuts were made without difficulty nor notching. Final box cut was made off the lateral aspect of the distal femur.  The tibia was then subluxated again anteriorly on the cut surface following medial  osteophyte removal, was best fit with size 5 tibial tray was pinned into position, drilled, and keel punched.  A trial reduction was now carried out with 5 femur, 5 tibia, 12.5 insert.  With this, the knee was very stable from extension and flexion with good range of motion and stability of the patella to the trochlea.  At this point, all trial components were removed.  Final components were opened including the polyethylene insert based on the trial reduction. In the synovial capsule junction of knee was injected with 0.25% Marcaine with epinephrine and 1 cc of Toradol.  The  knee was irrigated with normal saline solution and pulse lavaged.  Final components were then cemented onto clean and dried cut surface of bone, again using gentamicin-impregnated cement.  The knee was brought to extension.  The tourniquet was let down at 41 minutes and no significant hemostasis required.  Extruded cement was removed.  Once the cement had fully cured and excessive cement was removed throughout the knee, the final 12.5 insert was chosen.  A medium Hemovac drain was placed deep.  The extensor mechanism was then reapproximated over the Hemovac drain using #1 Vicryl with the knee in flexion.  The remainder of the wound was closed with 2-0 Vicryl and running 4-0 Monocryl.  The hip was cleaned, dried, and sterilely using Dermabond and Aquacel dressing.  Drain site dressed separately.  Knee was wrapped in an Ace wrap.  He was then brought to recovery room in stable condition tolerating the procedure well.     Madlyn Frankel Charlann Boxer, M.D.     MDO/MEDQ  D:  05/22/2011  T:  05/23/2011  Job:  [id number]  Electronically Signed by Durene Romans M.D. on 05/28/2011 09:15:12 AM

## 2011-07-17 DIAGNOSIS — R0602 Shortness of breath: Secondary | ICD-10-CM

## 2011-07-19 DIAGNOSIS — I2699 Other pulmonary embolism without acute cor pulmonale: Secondary | ICD-10-CM

## 2011-07-19 DIAGNOSIS — T81718A Complication of other artery following a procedure, not elsewhere classified, initial encounter: Secondary | ICD-10-CM

## 2011-07-20 DIAGNOSIS — T81718A Complication of other artery following a procedure, not elsewhere classified, initial encounter: Secondary | ICD-10-CM

## 2011-07-20 DIAGNOSIS — I2699 Other pulmonary embolism without acute cor pulmonale: Secondary | ICD-10-CM

## 2011-07-24 DIAGNOSIS — I2699 Other pulmonary embolism without acute cor pulmonale: Secondary | ICD-10-CM

## 2011-07-24 DIAGNOSIS — R079 Chest pain, unspecified: Secondary | ICD-10-CM

## 2011-07-24 DIAGNOSIS — T81718A Complication of other artery following a procedure, not elsewhere classified, initial encounter: Secondary | ICD-10-CM

## 2011-07-24 DIAGNOSIS — I2609 Other pulmonary embolism with acute cor pulmonale: Secondary | ICD-10-CM

## 2011-07-25 DIAGNOSIS — R0602 Shortness of breath: Secondary | ICD-10-CM

## 2011-07-27 ENCOUNTER — Ambulatory Visit (INDEPENDENT_AMBULATORY_CARE_PROVIDER_SITE_OTHER): Payer: Medicare Other | Admitting: *Deleted

## 2011-07-27 DIAGNOSIS — I2699 Other pulmonary embolism without acute cor pulmonale: Secondary | ICD-10-CM | POA: Insufficient documentation

## 2011-07-27 DIAGNOSIS — I824Y9 Acute embolism and thrombosis of unspecified deep veins of unspecified proximal lower extremity: Secondary | ICD-10-CM | POA: Insufficient documentation

## 2011-07-27 DIAGNOSIS — R0989 Other specified symptoms and signs involving the circulatory and respiratory systems: Secondary | ICD-10-CM

## 2011-07-27 DIAGNOSIS — Z7901 Long term (current) use of anticoagulants: Secondary | ICD-10-CM | POA: Insufficient documentation

## 2011-07-30 ENCOUNTER — Ambulatory Visit: Payer: Self-pay | Admitting: *Deleted

## 2011-08-06 ENCOUNTER — Inpatient Hospital Stay (HOSPITAL_COMMUNITY): Admission: RE | Admit: 2011-08-06 | Payer: Medicare Other | Source: Ambulatory Visit | Admitting: Orthopedic Surgery

## 2011-08-10 ENCOUNTER — Encounter: Payer: Self-pay | Admitting: Cardiology

## 2011-08-10 ENCOUNTER — Ambulatory Visit (INDEPENDENT_AMBULATORY_CARE_PROVIDER_SITE_OTHER): Payer: Medicare Other | Admitting: Cardiology

## 2011-08-10 ENCOUNTER — Encounter: Payer: Self-pay | Admitting: Internal Medicine

## 2011-08-10 DIAGNOSIS — I824Y9 Acute embolism and thrombosis of unspecified deep veins of unspecified proximal lower extremity: Secondary | ICD-10-CM

## 2011-08-10 DIAGNOSIS — I2699 Other pulmonary embolism without acute cor pulmonale: Secondary | ICD-10-CM

## 2011-08-10 DIAGNOSIS — Z7901 Long term (current) use of anticoagulants: Secondary | ICD-10-CM

## 2011-08-10 NOTE — Patient Instructions (Signed)
Follow up as scheduled. Your physician recommends that you continue on your current medications as directed. Please refer to the Current Medication list given to you today. Your physician has requested that you have an echocardiogram. Echocardiography is a painless test that uses sound waves to create images of your heart. It provides your doctor with information about the size and shape of your heart and how well your heart's chambers and valves are working. This procedure takes approximately one hour. There are no restrictions for this procedure. 

## 2011-08-13 NOTE — Assessment & Plan Note (Signed)
Status post massive pulmonary embolism associated with hypotension: Significant RV dysfunction by echocardiogram. We'll repeat an echocardiogram to assess his PA pressures and RV function.

## 2011-08-13 NOTE — Assessment & Plan Note (Signed)
Status post right total knee surgery but also scheduled in the future for left knee surgery. Based on the fact that the patient was actually adequately prophylaxed with  Xarelto and 6 days of subcutaneous Lovenox, anticipate that the patient will need prophylactically an IVC filter prior to proceeding with future knee surgery as another pulmonary embolism will likely be fatal and the patient actually may have not responded well to DVT prophylaxis as outlined above.

## 2011-08-13 NOTE — Progress Notes (Signed)
HPI The patient is 76 year old male who was recently admitted (this was several weeks ago) with a massive pulmonary embolism associated with hypotension and right heart failure after right total knee surgery performed on 05/22/2011. The patient was treated with IV heparin and overlapped with by mouth warfarin. Is followed in the Coumadin clinic. It appeared to have a DVT in the right lower extremity after knee surgery which was performed in early July 2012. Of note is that the patient was discharged on July 6, and then went to the [facility] for 2 days and was supposedly on Xarelto tablet by mouth daily dose at 10 mg a day. Subsequently the patient was admitted to [facility] for 7 days because of an ileus. During this time he received subcutaneous Lovenox 40 mg every 24 hours. He was then transferred back to the [facility] and was placed for an additional 10 days on by mouth xarelto at 10 mg by mouth daily times another 10 days. Several weeks thereafter the patient developed a pulmonary embolism as described above which was nearly fatal. At one point we have decided to give the patient the PA before so after intravenous heparin his blood pressure remained stable and was able to be treated with anticoagulation only. He presents for followup. He still short of breath and uses oxygen but at night and during the daytime during exertion. He also has a significant swelling is on the right lower extremity possibly consistent with post phlebitic syndrome although there is no associated pain. There are some petechiae visible of the lower extremity. Of note is that the patient also needs an additional knee surgery at some point in the future although certainly at this point he needs at least another 6 months of full dose anticoagulation for any further decisions can be made.  Allergies  Allergen Reactions  . Nitroglycerin     REACTION: BP decrease    Current Outpatient Prescriptions on File Prior to  Visit  Medication Sig Dispense Refill  . allopurinol (ZYLOPRIM) 300 MG tablet Take 300 mg by mouth daily.        Marland Kitchen aspirin 81 MG tablet Take 81 mg by mouth daily.        . diazepam (VALIUM) 2 MG tablet Take 1 tablet by mouth Twice daily.      . fenofibrate (TRICOR) 145 MG tablet Take 145 mg by mouth daily.        . fish oil-omega-3 fatty acids 1000 MG capsule Take 3 g by mouth daily.        . furosemide (LASIX) 20 MG tablet Take 20 mg by mouth daily.       . meclizine (ANTIVERT) 25 MG tablet Take 25 mg by mouth 3 (three) times daily as needed.        . metoprolol (LOPRESSOR) 50 MG tablet Take 50 mg by mouth daily.        Marland Kitchen omeprazole (PRILOSEC) 40 MG capsule Take 40 mg by mouth daily.        . probenecid (BENEMID) 500 MG tablet Take 500 mg by mouth daily.       . saxagliptin HCl (ONGLYZA) 5 MG TABS tablet Take 5 mg by mouth daily.        . simvastatin (ZOCOR) 20 MG tablet Take 1 tablet by mouth Daily.      Marland Kitchen warfarin (COUMADIN) 5 MG tablet Take 5 mg by mouth daily.          Past Medical History  Diagnosis Date  . Diabetes mellitus   . Hypertension   . Carotid sinus syndrome   . Other and unspecified hyperlipidemia   . Unspecified essential hypertension   . Shortness of breath   . Chest pain, unspecified   . Atherosclerosis of aorta   . Type II or unspecified type diabetes mellitus without mention of complication, not stated as uncontrolled   . Obstructive sleep apnea      CPAP    Past Surgical History  Procedure Date  . Pacemaker implanted  07/16/2005    Family History  Problem Relation Age of Onset  . Diabetes      FAMILY HISTORY  . Hypertension      FAMILY HISTORY    History   Social History  . Marital Status: Married    Spouse Name: N/A    Number of Children: N/A  . Years of Education: N/A   Occupational History  . Not on file.   Social History Main Topics  . Smoking status: Never Smoker   . Smokeless tobacco: Never Used   Comment:   Smoking Status: never   . Alcohol Use: No  . Drug Use: No  . Sexually Active: Not on file   Other Topics Concern  . Not on file   Social History Narrative  . No narrative on file   [id number] positives as outlined above. The remainder of the 18  point review of systems is negative  PHYSICAL EXAM BP 103/71  Pulse 87  Ht 6\' 1"  (1.854 m)  Wt 271 lb (122.925 kg)  BMI 35.75 kg/m2  SpO2 96% General: Well-developed, well-nourished in no distress Head: Normocephalic and atraumatic Eyes:PERRLA/EOMI intact, conjunctiva and lids normal Ears: No deformity or lesions Mouth:normal dentition, normal posterior pharynx Neck: Supple, no JVD.  No masses, thyromegaly or abnormal cervical nodes Lungs: Normal breath sounds bilaterally without wheezing.  Normal percussion Cardiac: regular rate and rhythm with normal S1 and S2, no S3 or S4.  PMI is normal.  No pathological murmurs Abdomen: Normal bowel sounds, abdomen is soft and nontender without masses, organomegaly or hernias noted.  No hepatosplenomegaly MSK: Back normal, normal gait muscle strength and tone normal Vascular: Pulse is normal in all 4 extremities Extremities: No peripheral pitting edema Neurologic: Alert and oriented x 3 Skin: Intact without lesions, petechiae right lower extremity as well as swelling right leg. Status post right total knee replacement  Lymphatics: No significant adenopathy Psychologic: Normal affect    ECG: Not available  ASSESSMENT AND PLAN

## 2011-08-13 NOTE — Assessment & Plan Note (Signed)
At least 6 months of Coumadin anticoagulation if not longer.

## 2011-08-22 ENCOUNTER — Other Ambulatory Visit (INDEPENDENT_AMBULATORY_CARE_PROVIDER_SITE_OTHER): Payer: Medicare Other | Admitting: *Deleted

## 2011-08-22 DIAGNOSIS — R0602 Shortness of breath: Secondary | ICD-10-CM

## 2011-08-22 DIAGNOSIS — I2699 Other pulmonary embolism without acute cor pulmonale: Secondary | ICD-10-CM

## 2011-08-30 ENCOUNTER — Encounter: Payer: Self-pay | Admitting: Internal Medicine

## 2011-08-30 ENCOUNTER — Ambulatory Visit (INDEPENDENT_AMBULATORY_CARE_PROVIDER_SITE_OTHER): Payer: Medicare Other | Admitting: Internal Medicine

## 2011-08-30 DIAGNOSIS — R55 Syncope and collapse: Secondary | ICD-10-CM

## 2011-08-30 DIAGNOSIS — G9001 Carotid sinus syncope: Secondary | ICD-10-CM

## 2011-08-30 DIAGNOSIS — Z950 Presence of cardiac pacemaker: Secondary | ICD-10-CM

## 2011-08-30 LAB — PACEMAKER DEVICE OBSERVATION
AL IMPEDENCE PM: 407 Ohm
AL THRESHOLD: 0.75 V
ATRIAL PACING PM: 2
BAMS-0001: 175 {beats}/min
BATTERY VOLTAGE: 2.76 V

## 2011-08-30 NOTE — Progress Notes (Signed)
   HPI  [name] is a 76 y.o. male seen in followup for a pacemaker implanted for what werethought to be neurally-mediated pauses.  He has had no recurrent Syncope.   He has a history of coronary artery disease with prior patent circumflex stent and otherwise nonobstructive coronary disease at catheterization June 2010.Marland Kitchen He has normal LV function;  a month later he underwent Myoview scanning which was read as normal.  He has a history of recurrent massive pulmonary embolism associated with hypotension and right heart failure after right total knee surgery performed on 05/22/2011. He is being followed closely by Janyth Pupa for this  Past Medical History  Diagnosis Date  . Diabetes mellitus   . Hypertension   . Carotid sinus syndrome   . Other and unspecified hyperlipidemia   . Unspecified essential hypertension   . Shortness of breath   . Chest pain, unspecified   . Atherosclerosis of aorta   . Type II or unspecified type diabetes mellitus without mention of complication, not stated as uncontrolled   . Obstructive sleep apnea      CPAP    Past Surgical History  Procedure Date  . Pacemaker implanted  07/16/2005    Current Outpatient Prescriptions  Medication Sig Dispense Refill  . allopurinol (ZYLOPRIM) 300 MG tablet Take 300 mg by mouth daily.        Marland Kitchen aspirin 81 MG tablet Take 81 mg by mouth daily.        . diazepam (VALIUM) 2 MG tablet Take 1 tablet by mouth Twice daily.      . fenofibrate (TRICOR) 145 MG tablet Take 145 mg by mouth daily.        . fish oil-omega-3 fatty acids 1000 MG capsule Take 3 g by mouth daily.        . furosemide (LASIX) 20 MG tablet Take 20 mg by mouth daily.       . meclizine (ANTIVERT) 25 MG tablet Take 25 mg by mouth 3 (three) times daily as needed.        . metoprolol (LOPRESSOR) 50 MG tablet Take 50 mg by mouth daily.        Marland Kitchen omeprazole (PRILOSEC) 40 MG capsule Take 40 mg by mouth daily.        . OXYGEN-HELIUM IN Inhale 3 L into the lungs  as directed.        . probenecid (BENEMID) 500 MG tablet Take 500 mg by mouth daily.       . saxagliptin HCl (ONGLYZA) 5 MG TABS tablet Take 5 mg by mouth daily.        . simvastatin (ZOCOR) 20 MG tablet Take 1 tablet by mouth Daily.      Marland Kitchen warfarin (COUMADIN) 5 MG tablet Take 5 mg by mouth daily.          Allergies  Allergen Reactions  . Nitroglycerin     REACTION: BP decrease    Review of Systems negative except from HPI and PMH  Physical Exam Well developed and well nourished in no acute distress HENT normal E scleral and icterus clear Neck Supple JVP flat; carotids brisk and full Clear to ausculation Regular rate and rhythm, no murmurs gallops or rub Soft with active bowel sounds No clubbing cyanosis b/l 1-2 edema Alert and oriented, grossly normal motor and sensory function, walking with a cane Skin Warm and Dry Affect normal   Assessment and  Plan

## 2011-08-30 NOTE — Assessment & Plan Note (Signed)
The patient's device was interrogated.  The information was reviewed. No changes were made in the programming.    

## 2011-08-30 NOTE — Patient Instructions (Signed)
Your physician wants you to follow-up in: 1 year with Dr. Johney Frame in [place]. You will receive a reminder letter in the mail two months in advance. If you don't receive a letter, please call our office to schedule the follow-up appointment.   Your physician recommends that you continue on your current medications as directed. Please refer to the Current Medication list given to you today.

## 2011-08-30 NOTE — Assessment & Plan Note (Addendum)
No recurrent syncope 

## 2011-09-13 ENCOUNTER — Encounter: Payer: Self-pay | Admitting: Cardiology

## 2011-09-13 ENCOUNTER — Ambulatory Visit (INDEPENDENT_AMBULATORY_CARE_PROVIDER_SITE_OTHER): Payer: Medicare Other | Admitting: Cardiology

## 2011-09-13 VITALS — BP 103/68 | HR 86 | Ht 73.0 in | Wt 280.0 lb

## 2011-09-13 DIAGNOSIS — I824Y9 Acute embolism and thrombosis of unspecified deep veins of unspecified proximal lower extremity: Secondary | ICD-10-CM

## 2011-09-13 DIAGNOSIS — R55 Syncope and collapse: Secondary | ICD-10-CM

## 2011-09-13 DIAGNOSIS — Z7901 Long term (current) use of anticoagulants: Secondary | ICD-10-CM

## 2011-09-13 NOTE — Patient Instructions (Signed)
Continue all current medications. Your physician wants you to follow up in: 6 months.  You will receive a reminder letter in the mail one-two months in advance.  If you don't receive a letter, please call our office to schedule the follow up appointment   

## 2011-09-13 NOTE — Assessment & Plan Note (Signed)
Patient is on Coumadin. Please see details for future surgery in my previous note. There is a palpable cord today but no evidence of deep venous thrombosis by ultrasound Doppler

## 2011-09-13 NOTE — Progress Notes (Signed)
History of present illness:  The patient is a 76 year old male with a recent history of knee replacement complicated by a large pulmonary embolism with right ventricular failure and hypotension, despite the fact that the patient was on prophylaxis with Xarelto. He had a followup echocardiogram done and has normalized LV function and RV function. He denies any shortness of breath or chest pain. His main problem is that he feels depressed because he can still not walk very well and his right knee.  The patient also reports pain in the right lower extremity in the midcalf area. There is a palpable cord but also a hematoma in the right side of his leg appears to be all bruised up.  Allergies, family history and social history: As documented in chart and reviewed  Medications: Documented and reviewed in chart.  Past medical history: Reviewed and see problem list below   Review of systems: No nausea and vomiting. No fever or chills. No melena hematochezia. No dysuria or frequency. No visual changes   Physical examination : Vital signs documented below General: Well-nourished male in no apparent distress HEENT: EOMI, PERRLA normal carotid upstroke no carotid bruits. No thyromegaly Lungs: Clear breath sounds bilaterally with no wheezing Heart: Regular rate and rhythm with normal S1-S2 and no murmur rubs or gallops Abdomen: Soft and nontender no rebound or guarding good bowel sounds.  Extremity exam: No cyanosis clubbing there is edema in the right leg as well as a palpable cord and a hematoma in the midcalf Neurologic: Alert and oriented grossly nonfocal Psychiatric: Depressed affect Vascular exam: Normal peripheral pulses dorsalis pedis and posterior table pulses

## 2011-09-13 NOTE — Assessment & Plan Note (Signed)
Continue anticoagulation followed by the patient's primary care physician.

## 2011-09-13 NOTE — Assessment & Plan Note (Signed)
Status post pacemaker implantation. No recurrent syncope

## 2011-11-29 ENCOUNTER — Encounter: Payer: Medicare Other | Admitting: *Deleted

## 2011-12-03 ENCOUNTER — Encounter: Payer: Self-pay | Admitting: *Deleted

## 2011-12-07 ENCOUNTER — Ambulatory Visit (INDEPENDENT_AMBULATORY_CARE_PROVIDER_SITE_OTHER): Payer: BC Managed Care – PPO | Admitting: *Deleted

## 2011-12-07 ENCOUNTER — Encounter: Payer: Self-pay | Admitting: Internal Medicine

## 2011-12-07 DIAGNOSIS — I498 Other specified cardiac arrhythmias: Secondary | ICD-10-CM

## 2011-12-16 LAB — REMOTE PACEMAKER DEVICE
AL AMPLITUDE: 2.8 mv
BAMS-0001: 175 {beats}/min
BATTERY VOLTAGE: 2.75 V
BRDY-0002RV: 60 {beats}/min
RV LEAD AMPLITUDE: 22.4 mv
RV LEAD IMPEDENCE PM: 463 Ohm

## 2011-12-18 ENCOUNTER — Encounter: Payer: Self-pay | Admitting: *Deleted

## 2011-12-20 NOTE — Progress Notes (Signed)
Remote pacer check  

## 2012-03-13 ENCOUNTER — Ambulatory Visit (INDEPENDENT_AMBULATORY_CARE_PROVIDER_SITE_OTHER): Payer: Medicare Other | Admitting: *Deleted

## 2012-03-13 ENCOUNTER — Encounter: Payer: Self-pay | Admitting: Internal Medicine

## 2012-03-13 DIAGNOSIS — G9001 Carotid sinus syncope: Secondary | ICD-10-CM

## 2012-03-13 DIAGNOSIS — R55 Syncope and collapse: Secondary | ICD-10-CM

## 2012-03-17 LAB — REMOTE PACEMAKER DEVICE
AL AMPLITUDE: 2.8 mv
AL IMPEDENCE PM: 397 Ohm
BAMS-0001: 175 {beats}/min
BATTERY VOLTAGE: 2.75 V
BRDY-0004RV: 130 {beats}/min
RV LEAD IMPEDENCE PM: 448 Ohm
VENTRICULAR PACING PM: 0

## 2012-03-18 NOTE — Progress Notes (Signed)
Remote pacer check  

## 2012-03-31 ENCOUNTER — Encounter: Payer: Self-pay | Admitting: *Deleted

## 2012-05-20 ENCOUNTER — Ambulatory Visit (INDEPENDENT_AMBULATORY_CARE_PROVIDER_SITE_OTHER): Payer: Medicare Other | Admitting: Cardiology

## 2012-05-20 VITALS — BP 118/71 | HR 71 | Ht 73.0 in

## 2012-05-20 DIAGNOSIS — I824Y9 Acute embolism and thrombosis of unspecified deep veins of unspecified proximal lower extremity: Secondary | ICD-10-CM

## 2012-05-20 DIAGNOSIS — G4733 Obstructive sleep apnea (adult) (pediatric): Secondary | ICD-10-CM

## 2012-05-20 DIAGNOSIS — R0602 Shortness of breath: Secondary | ICD-10-CM

## 2012-05-20 DIAGNOSIS — I5023 Acute on chronic systolic (congestive) heart failure: Secondary | ICD-10-CM

## 2012-05-20 DIAGNOSIS — I509 Heart failure, unspecified: Secondary | ICD-10-CM

## 2012-05-20 DIAGNOSIS — I259 Chronic ischemic heart disease, unspecified: Secondary | ICD-10-CM

## 2012-05-20 NOTE — Assessment & Plan Note (Signed)
Patient currently on warfarin. Patient actually developed DVT and PE in the setting of Xarelto prophylaxis

## 2012-05-20 NOTE — Patient Instructions (Signed)
   Increase Lasix to 40mg  daily x 5 days, then resume previous dose of 20mg  daily  Echo  Labs:  BMET & BNP  Above test can be done in about 7-10 days  Office will contact with results Follow up in  3-6 months

## 2012-05-20 NOTE — Assessment & Plan Note (Signed)
Patient's CPAP device broke down. Is more fatigued than during the day and also his wife for stroke 5 weeks ago and has difficulty sleeping. I asked him to followup with his CPAP company to provide him a new device.

## 2012-05-20 NOTE — Assessment & Plan Note (Signed)
No recurrent chest pain. Continue current medical regimen 

## 2012-05-20 NOTE — Assessment & Plan Note (Signed)
Patient's clinical evidence of volume overload and asked him to take Lasix 40 mg a day x5 days and then cut back to 20 mg a day. We will also get an echocardiogram to assess his LV and RV function particularly of his pulmonary embolism and will check a BNP and BMET level at that time.

## 2012-05-20 NOTE — Progress Notes (Signed)
Jermaine Bottoms, MD, [facility] ABIM Board Certified in Adult Cardiovascular Medicine,Internal Medicine and Critical Care Medicine    CC:     Weight gain and edema                                                                             HPI:     The patient has a history of coronary artery disease. He has diastolic dysfunction but also more recently had evidence of RV dysfunction because of a pulmonary embolism. He has gained some weight. He reports increased lower extremity edema. He also has more shortness of breath on exertion he denies any chest pain presyncope or syncope. He feels more fatigued during the daytime and is wondering whether this could be related to the fact that is not using his CPAP device anymore which is been broken is also under a lot of stress because his wife for stroke 5 weeks ago. He has difficulty sleeping. He denies however any anginal chest pain.     PMH: reviewed and listed in Problem List in Electronic Records (and see below) Past Medical History  Diagnosis Date  . Diabetes mellitus   . Hypertension   . Carotid sinus syndrome   . Other and unspecified hyperlipidemia   . Unspecified essential hypertension   . Shortness of breath   . Chest pain, unspecified   . Atherosclerosis of aorta   . Type II or unspecified type diabetes mellitus without mention of complication, not stated as uncontrolled   . Obstructive sleep apnea      CPAP   Past Surgical History  Procedure Date  . Pacemaker implanted  07/16/2005    Allergies/SH/FHX : available in Electronic Records for review  Allergies  Allergen Reactions  . Nitroglycerin     REACTION: BP decrease   History   Social History  . Marital Status: Married    Spouse Name: N/A    Number of Children: N/A  . Years of Education: N/A   Occupational History  . Not on file.   Social History Main Topics  . Smoking status: Never Smoker   . Smokeless tobacco: Never Used   Comment:   Smoking Status: never  .  Alcohol Use: No  . Drug Use: No  . Sexually Active: Not on file   Other Topics Concern  . Not on file   Social History Narrative  . No narrative on file   Family History  Problem Relation Age of Onset  . Diabetes      FAMILY HISTORY  . Hypertension      FAMILY HISTORY    Medications: Current Outpatient Prescriptions  Medication Sig Dispense Refill  . allopurinol (ZYLOPRIM) 300 MG tablet Take 300 mg by mouth daily.        Marland Kitchen aspirin 81 MG tablet Take 81 mg by mouth daily.        . fenofibrate (TRICOR) 145 MG tablet Take 145 mg by mouth daily.        . fish oil-omega-3 fatty acids 1000 MG capsule Take 2 g by mouth daily.       . furosemide (LASIX) 20 MG tablet Take 20 mg by mouth daily.       Marland Kitchen  glimepiride (AMARYL) 2 MG tablet Take 2 mg by mouth daily before breakfast.      . metoprolol (LOPRESSOR) 50 MG tablet Take 50 mg by mouth daily.        Marland Kitchen omeprazole (PRILOSEC) 40 MG capsule Take 40 mg by mouth daily.        . probenecid (BENEMID) 500 MG tablet Take 1,000 mg by mouth daily.       . simvastatin (ZOCOR) 20 MG tablet Take 1 tablet by mouth Daily.      . sitaGLIPtin (JANUVIA) 100 MG tablet Take 100 mg by mouth daily.      Marland Kitchen warfarin (COUMADIN) 5 MG tablet Take 5 mg by mouth as directed. Managed by Dr. Bartholomew Crews office      . NON FORMULARY CPAP USE AS DIRECTED        ROS: No nausea or vomiting. No fever or chills.No melena or hematochezia.No bleeding.No claudication  Physical Exam: BP 118/71  Pulse 71  Ht 6\' 1"  (1.854 m)  SpO2 96% General: Overweight white male in no distress. Neck: Normal carotid upstroke no carotid bruits. No thyromegaly nonnodular thyroid JVP is 6-7 cm Lungs: Clear breath sounds bilaterally no wheezing Cardiac: Regular rate and rhythm with normal S1-S2 no murmurs or gallops Vascular: 2+ edema. Normal distal pulses bilaterally Skin: Warm and dry Physcologic: Normal affect  12lead ECG: Not obtained Limited bedside ECHO:N/A No images are attached  to the encounter.   I reviewed and summarized the old records. I reviewed ECG and prior blood work.  Assessment and Plan  Acute venous embolism and thrombosis of deep vessels of proximal lower extremity Patient currently on warfarin. Patient actually developed DVT and PE in the setting of Xarelto prophylaxis  CORONARY ARTERY DISEASE, S/P PTCA No recurrent chest pain. Continue current medical regimen  SLEEP APNEA, OBSTRUCTIVE Patient's CPAP device broke down. Is more fatigued than during the day and also his wife for stroke 5 weeks ago and has difficulty sleeping. I asked him to followup with his CPAP company to provide him a new device.  DYSPNEA Patient's clinical evidence of volume overload and asked him to take Lasix 40 mg a day x5 days and then cut back to 20 mg a day. We will also get an echocardiogram to assess his LV and RV function particularly of his pulmonary embolism and will check a BNP and BMET level at that time.    Patient Active Problem List  Diagnosis  . HYPERLIPIDEMIA-MIXED  . SLEEP APNEA, OBSTRUCTIVE  . RESTLESS LEG SYNDROME  . CAROTID SINUS SYNDROME  . BENIGN POSITIONAL VERTIGO  . HYPERTENSION, UNSPECIFIED  . CORONARY ARTERY DISEASE, S/P PTCA  . ATHEROSCLEROSIS OF AORTA  . ABDOMINAL AORTIC ANEURYSM  . BACK PAIN, CHRONIC, INTERMITTENT  . DYSPNEA  . CHEST PAIN-UNSPECIFIED  . Preoperative cardiovascular examination  . Acute venous embolism and thrombosis of deep vessels of proximal lower extremity  . Other pulmonary embolism and infarction  . Encounter for long-term (current) use of anticoagulants  . Syncope-presumed neurally mediated  . Pacemaker-[employer]tr[employer]

## 2012-06-12 ENCOUNTER — Other Ambulatory Visit: Payer: Self-pay

## 2012-06-12 ENCOUNTER — Other Ambulatory Visit (INDEPENDENT_AMBULATORY_CARE_PROVIDER_SITE_OTHER): Payer: Medicare Other

## 2012-06-12 DIAGNOSIS — I509 Heart failure, unspecified: Secondary | ICD-10-CM

## 2012-06-12 DIAGNOSIS — I5023 Acute on chronic systolic (congestive) heart failure: Secondary | ICD-10-CM

## 2012-06-16 ENCOUNTER — Encounter: Payer: Self-pay | Admitting: *Deleted

## 2012-06-18 ENCOUNTER — Telehealth: Payer: Self-pay | Admitting: *Deleted

## 2012-06-18 DIAGNOSIS — R7989 Other specified abnormal findings of blood chemistry: Secondary | ICD-10-CM

## 2012-06-18 DIAGNOSIS — Z79899 Other long term (current) drug therapy: Secondary | ICD-10-CM

## 2012-06-18 NOTE — Telephone Encounter (Signed)
Notes Recorded by Lesle Chris, LPN on 02/25/8118 at 1:52 PM Patient notified and verbalized understanding. Will repeat labs on Monday, 8/5. Lab order faxed to [facility] Notes Recorded by Lesle Chris, LPN on 1/47/8295 at 9:54 AM Left message to return call.  Notes Recorded by Lesle Chris, LPN on 05/09/3085 at 3:47 PM Busy  Notes Recorded by Lesle Chris, LPN on 5/78/4696 at 3:21 PM Busy

## 2012-06-18 NOTE — Telephone Encounter (Signed)
Message copied by Lesle Chris on Wed Jun 18, 2012  1:52 PM ------      Message from: Prescott Parma C      Created: Mon Jun 16, 2012 12:14 PM       Creatinine 2.1 with neg BNP level. D/c Lasix, and repeat BMET in 2 days. Increase fluid intake (NL LVF).

## 2012-06-19 ENCOUNTER — Encounter: Payer: Medicare Other | Admitting: *Deleted

## 2012-06-19 ENCOUNTER — Encounter: Payer: Self-pay | Admitting: Internal Medicine

## 2012-06-19 ENCOUNTER — Ambulatory Visit (INDEPENDENT_AMBULATORY_CARE_PROVIDER_SITE_OTHER): Payer: Medicare Other | Admitting: *Deleted

## 2012-06-19 DIAGNOSIS — G9001 Carotid sinus syncope: Secondary | ICD-10-CM

## 2012-06-19 LAB — PACEMAKER DEVICE OBSERVATION
AL AMPLITUDE: 2.8 mv
AL IMPEDENCE PM: 377 Ohm
AL THRESHOLD: 0.75 v
ATRIAL PACING PM: 8
BAMS-0001: 175 {beats}/min
BATTERY VOLTAGE: 2.74 v
RV LEAD AMPLITUDE: 16 mv
RV LEAD IMPEDENCE PM: 443 Ohm
RV LEAD THRESHOLD: 1.375 v
VENTRICULAR PACING PM: 0

## 2012-06-19 NOTE — Progress Notes (Signed)
Pacer check in clinic  

## 2012-06-25 ENCOUNTER — Encounter: Payer: Self-pay | Admitting: *Deleted

## 2012-06-25 ENCOUNTER — Telehealth: Payer: Self-pay | Admitting: *Deleted

## 2012-06-25 NOTE — Telephone Encounter (Signed)
Message copied by Lesle Chris on Wed Jun 25, 2012  3:39 PM ------      Message from: Prescott Parma C      Created: Wed Jun 25, 2012  2:22 PM       Creatinine improved, down to 1.5 (2.1). Continue current medication regimen, off Lasix. F/u with GD as scheduled.

## 2012-06-25 NOTE — Telephone Encounter (Signed)
Notes Recorded by Lesle Chris, LPN on 03/20/8412 at 3:39 PM Wife Vena Austria) notified of below. Notes Recorded by Lesle Chris, LPN on 12/23/4008 at 2:36 PM Left message to return call.

## 2012-09-22 ENCOUNTER — Encounter: Payer: Self-pay | Admitting: Internal Medicine

## 2012-09-22 ENCOUNTER — Ambulatory Visit (INDEPENDENT_AMBULATORY_CARE_PROVIDER_SITE_OTHER): Payer: Medicare Other | Admitting: Internal Medicine

## 2012-09-22 VITALS — BP 122/77 | HR 69 | Ht 73.5 in | Wt 295.8 lb

## 2012-09-22 DIAGNOSIS — R55 Syncope and collapse: Secondary | ICD-10-CM

## 2012-09-22 DIAGNOSIS — Z950 Presence of cardiac pacemaker: Secondary | ICD-10-CM

## 2012-09-22 DIAGNOSIS — I2699 Other pulmonary embolism without acute cor pulmonale: Secondary | ICD-10-CM

## 2012-09-22 DIAGNOSIS — I498 Other specified cardiac arrhythmias: Secondary | ICD-10-CM

## 2012-09-22 DIAGNOSIS — Z7901 Long term (current) use of anticoagulants: Secondary | ICD-10-CM

## 2012-09-22 DIAGNOSIS — R001 Bradycardia, unspecified: Secondary | ICD-10-CM

## 2012-09-22 LAB — PACEMAKER DEVICE OBSERVATION
AL AMPLITUDE: 5.6 mv
AL THRESHOLD: 0.75 V
ATRIAL PACING PM: 5
BAMS-0001: 175 {beats}/min
RV LEAD IMPEDENCE PM: 463 Ohm
RV LEAD THRESHOLD: 1.25 V
VENTRICULAR PACING PM: 0

## 2012-09-22 NOTE — Progress Notes (Signed)
PCP: Toma Deiters, MD  [name] is a 77 y.o. male who presents today for routine electrophysiology followup.  Since last being seen in our clinic, the patient reports doing very well.  He is limited by DJD.  He struggles with his weight.  Today, he denies symptoms of palpitations, chest pain, shortness of breath (above baseline),  lower extremity edema,  presyncope, or syncope.  The patient is otherwise without complaint today.   Past Medical History  Diagnosis Date  . Diabetes mellitus   . Hypertension   . Carotid sinus syndrome   . Other and unspecified hyperlipidemia   . Unspecified essential hypertension   . Shortness of breath   . Chest pain, unspecified   . Atherosclerosis of aorta   . Type II or unspecified type diabetes mellitus without mention of complication, not stated as uncontrolled   . Obstructive sleep apnea      CPAP   Past Surgical History  Procedure Date  . Pacemaker implanted  07/16/2005    Current Outpatient Prescriptions  Medication Sig Dispense Refill  . allopurinol (ZYLOPRIM) 300 MG tablet Take 300 mg by mouth daily.        Marland Kitchen aspirin 81 MG tablet Take 81 mg by mouth daily.        . fenofibrate (TRICOR) 145 MG tablet Take 145 mg by mouth daily.        . fish oil-omega-3 fatty acids 1000 MG capsule Take 2 g by mouth daily.       . furosemide (LASIX) 20 MG tablet Take 20 mg by mouth daily.       Marland Kitchen glimepiride (AMARYL) 2 MG tablet Take 2 mg by mouth daily before breakfast.      . metoprolol (LOPRESSOR) 50 MG tablet Take 50 mg by mouth daily.        . NON FORMULARY CPAP USE AS DIRECTED      . omeprazole (PRILOSEC) 40 MG capsule Take 40 mg by mouth daily.        . probenecid (BENEMID) 500 MG tablet Take 1,000 mg by mouth daily.       . saxagliptin HCl (ONGLYZA) 5 MG TABS tablet Take 5 mg by mouth daily.      . simvastatin (ZOCOR) 20 MG tablet Take 1 tablet by mouth Daily.      . sitaGLIPtin (JANUVIA) 100 MG tablet Take 100 mg by mouth daily.      Marland Kitchen  warfarin (COUMADIN) 5 MG tablet Take 5 mg by mouth as directed. Managed by Dr. Bartholomew Crews office        Physical Exam: Filed Vitals:   09/22/12 1314  BP: 122/77  Pulse: 69  Height: 6' 1.5" (1.867 m)  Weight: 295 lb 12.8 oz (134.174 kg)    GEN- The patient is overweight appearing, alert and oriented x 3 today.   Head- normocephalic, atraumatic Eyes-  Sclera clear, conjunctiva pink Ears- hearing intact Oropharynx- clear Lungs- Clear to ausculation bilaterally, normal work of breathing Chest- pacemaker pocket is well healed Heart- Regular rate and rhythm, no murmurs, rubs or gallops, PMI not laterally displaced GI- soft, NT, ND, + BS Extremities- no clubbing, cyanosis, or edema  Pacemaker interrogation- reviewed in detail today,  See PACEART report  Assessment and Plan:  1. Bradycardia Normal pacemaker function See Pace Art report No changes today  2. Prior PTE Continue long term anticoagulation with coumadin  He will follow-up with Dr Diona Browner in 4 months [facility] every 3 months I will see  again in a year

## 2012-09-22 NOTE — Patient Instructions (Signed)
Continue all current medications. [facility] (device check) - 12/29/2012. Dr. Johney Frame - 1 year  Routine cardiac follow up in 4 months

## 2012-11-19 DIAGNOSIS — I639 Cerebral infarction, unspecified: Secondary | ICD-10-CM

## 2012-11-19 HISTORY — DX: Cerebral infarction, unspecified: I63.9

## 2012-11-19 HISTORY — PX: FILTERING PROCEDURE: SHX5296

## 2012-12-09 ENCOUNTER — Telehealth: Payer: Self-pay | Admitting: Internal Medicine

## 2012-12-09 NOTE — Telephone Encounter (Signed)
[name] is Inpatient at [facility] . His wife informed our office and Gene Serpe PA-C that [name] will be following Dr.Degent.

## 2012-12-09 NOTE — Telephone Encounter (Signed)
Noted in paceart/kwm  

## 2012-12-29 ENCOUNTER — Ambulatory Visit (INDEPENDENT_AMBULATORY_CARE_PROVIDER_SITE_OTHER): Payer: Medicare Other | Admitting: *Deleted

## 2012-12-29 ENCOUNTER — Other Ambulatory Visit: Payer: Self-pay | Admitting: Internal Medicine

## 2012-12-29 DIAGNOSIS — I498 Other specified cardiac arrhythmias: Secondary | ICD-10-CM

## 2012-12-29 DIAGNOSIS — Z950 Presence of cardiac pacemaker: Secondary | ICD-10-CM

## 2012-12-29 DIAGNOSIS — R001 Bradycardia, unspecified: Secondary | ICD-10-CM

## 2013-01-01 LAB — REMOTE PACEMAKER DEVICE
AL IMPEDENCE PM: 398 Ohm
ATRIAL PACING PM: 2
BATTERY VOLTAGE: 2.73 V
BRDY-0003RV: 130 {beats}/min
BRDY-0004RV: 130 {beats}/min
RV LEAD IMPEDENCE PM: 492 Ohm
RV LEAD THRESHOLD: 1.25 V

## 2013-01-13 ENCOUNTER — Encounter: Payer: Self-pay | Admitting: *Deleted

## 2013-01-16 ENCOUNTER — Encounter: Payer: Self-pay | Admitting: Internal Medicine

## 2013-01-19 ENCOUNTER — Ambulatory Visit (HOSPITAL_COMMUNITY)
Admission: RE | Admit: 2013-01-19 | Discharge: 2013-01-19 | Disposition: A | Discharge status: Home / Self Care | Payer: Medicare Other | Source: Ambulatory Visit | Attending: Interventional Radiology | Admitting: Interventional Radiology

## 2013-01-19 ENCOUNTER — Other Ambulatory Visit (HOSPITAL_COMMUNITY): Payer: Self-pay | Admitting: Interventional Radiology

## 2013-01-19 DIAGNOSIS — I2699 Other pulmonary embolism without acute cor pulmonale: Secondary | ICD-10-CM

## 2013-01-19 DIAGNOSIS — Z8673 Personal history of transient ischemic attack (TIA), and cerebral infarction without residual deficits: Secondary | ICD-10-CM | POA: Insufficient documentation

## 2013-01-19 MED ORDER — MIDAZOLAM HCL 2 MG/2ML IJ SOLN
INTRAMUSCULAR | Status: AC
  Filled 2013-01-19: qty 4

## 2013-01-19 MED ORDER — MIDAZOLAM HCL 2 MG/2ML IJ SOLN
INTRAMUSCULAR | Status: DC | PRN
  Administered 2013-01-19: 1 mg via INTRAVENOUS

## 2013-01-19 MED ORDER — FENTANYL CITRATE 0.05 MG/ML IJ SOLN
INTRAMUSCULAR | Status: AC
  Filled 2013-01-19: qty 2

## 2013-01-19 MED ORDER — FENTANYL CITRATE 0.05 MG/ML IJ SOLN
INTRAMUSCULAR | Status: DC | PRN
  Administered 2013-01-19: 50 ug via INTRAVENOUS

## 2013-01-19 MED ORDER — IOHEXOL 300 MG/ML  SOLN
100.0000 mL | Freq: Once | INTRAMUSCULAR | Status: AC | PRN
  Administered 2013-01-19: 50 mL via INTRAVENOUS

## 2013-01-19 NOTE — H&P (Signed)
[name] is an 78 y.o. male.   Chief Complaint: "I'm here to get a filter" HPI: Patient with history of recent subdural hematoma and acute bilateral PE presents today for IVC filter placement.  Past Medical History  Diagnosis Date  . Diabetes mellitus   . Hypertension   . Carotid sinus syndrome   . Other and unspecified hyperlipidemia   . Unspecified essential hypertension   . Shortness of breath   . Chest pain, unspecified   . Atherosclerosis of aorta   . Type II or unspecified type diabetes mellitus without mention of complication, not stated as uncontrolled   . Obstructive sleep apnea      CPAP    Past Surgical History  Procedure Laterality Date  . Pacemaker implanted   07/16/2005    Family History  Problem Relation Age of Onset  . Diabetes      FAMILY HISTORY  . Hypertension      FAMILY HISTORY   Social History:  reports that he has never smoked. He has never used smokeless tobacco. He reports that he does not drink alcohol or use illicit drugs.  Allergies:  Allergies  Allergen Reactions  . Nitroglycerin     REACTION: BP decrease    Current outpatient prescriptions:allopurinol (ZYLOPRIM) 300 MG tablet, Take 300 mg by mouth daily.  , Disp: , Rfl: ;  aspirin 81 MG tablet, Take 81 mg by mouth daily.  , Disp: , Rfl: ;  fenofibrate (TRICOR) 145 MG tablet, Take 145 mg by mouth daily.  , Disp: , Rfl: ;  fish oil-omega-3 fatty acids 1000 MG capsule, Take 2 g by mouth daily. , Disp: , Rfl: ;  furosemide (LASIX) 20 MG tablet, Take 20 mg by mouth daily. , Disp: , Rfl:  glimepiride (AMARYL) 2 MG tablet, Take 2 mg by mouth daily before breakfast., Disp: , Rfl: ;  metoprolol (LOPRESSOR) 50 MG tablet, Take 50 mg by mouth daily.  , Disp: , Rfl: ;  NON FORMULARY, CPAP USE AS DIRECTED, Disp: , Rfl: ;  omeprazole (PRILOSEC) 40 MG capsule, Take 40 mg by mouth daily.  , Disp: , Rfl: ;  probenecid (BENEMID) 500 MG tablet, Take 1,000 mg by mouth daily. , Disp: , Rfl:  saxagliptin HCl  (ONGLYZA) 5 MG TABS tablet, Take 5 mg by mouth daily., Disp: , Rfl: ;  simvastatin (ZOCOR) 20 MG tablet, Take 1 tablet by mouth Daily., Disp: , Rfl: ;  sitaGLIPtin (JANUVIA) 100 MG tablet, Take 100 mg by mouth daily., Disp: , Rfl: ;  warfarin (COUMADIN) 5 MG tablet, Take 5 mg by mouth as directed. Managed by Dr. Bartholomew Crews office, Disp: , Rfl:    No results found for this or any previous visit (from the past 48 hour(s)). No results found.  Review of Systems  Constitutional: Negative for fever and chills.  Respiratory: Positive for shortness of breath. Negative for cough.   Cardiovascular: Negative for chest pain.  Gastrointestinal: Negative for nausea, vomiting and abdominal pain.  Musculoskeletal: Negative for back pain.  Neurological: Negative for headaches.    Vitals: BP 119/64  HR 86  R 24  O2 SATS 97% 4 L  TEMP  98.3 Physical Exam  Constitutional: He is oriented to person, place, and time. He appears well-developed and well-nourished.  Cardiovascular: Normal rate and regular rhythm.   Pt has pacemaker  Respiratory: Effort normal and breath sounds normal. He has no wheezes. He has no rales.  GI: Soft. Bowel sounds are normal. There  is no tenderness.  obese  Musculoskeletal: Normal range of motion. He exhibits edema.  Neurological: He is alert and oriented to person, place, and time.     Assessment/Plan Pt with hx recent subdural hematoma, acute bilateral PE. Plan is for IVC filter placement today. Details/risks of procedure d/w pt with his understanding and consent.  ALLRED,D KEVIN 01/19/2013, 11:00 AM

## 2013-01-19 NOTE — Procedures (Signed)
IVC filter No comp 

## 2013-01-20 ENCOUNTER — Ambulatory Visit: Payer: Medicare Other | Admitting: Cardiology

## 2013-01-26 ENCOUNTER — Encounter: Payer: Self-pay | Admitting: Internal Medicine

## 2013-03-30 ENCOUNTER — Encounter: Payer: Medicare Other | Admitting: *Deleted

## 2013-04-10 ENCOUNTER — Encounter: Payer: Self-pay | Admitting: *Deleted

## 2013-04-20 ENCOUNTER — Encounter: Payer: Self-pay | Admitting: Internal Medicine

## 2013-04-20 ENCOUNTER — Ambulatory Visit (INDEPENDENT_AMBULATORY_CARE_PROVIDER_SITE_OTHER): Payer: Medicare Other | Admitting: *Deleted

## 2013-04-20 DIAGNOSIS — R001 Bradycardia, unspecified: Secondary | ICD-10-CM

## 2013-04-20 DIAGNOSIS — Z950 Presence of cardiac pacemaker: Secondary | ICD-10-CM

## 2013-04-20 DIAGNOSIS — I498 Other specified cardiac arrhythmias: Secondary | ICD-10-CM

## 2013-04-20 LAB — REMOTE PACEMAKER DEVICE
AL AMPLITUDE: 2.8 mv
AL IMPEDENCE PM: 369 Ohm
BAMS-0001: 175 {beats}/min
BATTERY VOLTAGE: 2.73 V
RV LEAD AMPLITUDE: 11.2 mv
RV LEAD IMPEDENCE PM: 494 Ohm
RV LEAD THRESHOLD: 1.375 V

## 2013-04-29 ENCOUNTER — Encounter: Payer: Self-pay | Admitting: *Deleted

## 2013-07-21 ENCOUNTER — Encounter: Payer: Medicare Other | Admitting: *Deleted

## 2013-07-29 ENCOUNTER — Encounter: Payer: Self-pay | Admitting: *Deleted

## 2013-09-30 ENCOUNTER — Encounter: Payer: Self-pay | Admitting: Internal Medicine

## 2013-09-30 ENCOUNTER — Ambulatory Visit (INDEPENDENT_AMBULATORY_CARE_PROVIDER_SITE_OTHER): Payer: Medicare Other | Admitting: Internal Medicine

## 2013-09-30 ENCOUNTER — Encounter: Payer: Medicare Other | Admitting: Internal Medicine

## 2013-09-30 VITALS — BP 115/73 | HR 67 | Ht 73.0 in | Wt 299.0 lb

## 2013-09-30 DIAGNOSIS — I2699 Other pulmonary embolism without acute cor pulmonale: Secondary | ICD-10-CM

## 2013-09-30 DIAGNOSIS — R001 Bradycardia, unspecified: Secondary | ICD-10-CM

## 2013-09-30 DIAGNOSIS — I498 Other specified cardiac arrhythmias: Secondary | ICD-10-CM

## 2013-09-30 LAB — MDC_IDC_ENUM_SESS_TYPE_INCLINIC
Battery Remaining Longevity: 29 mo
Battery Voltage: 2.72 V
Brady Statistic AP VP Percent: 0 %
Brady Statistic AS VP Percent: 0 %
Lead Channel Pacing Threshold Amplitude: 1 V
Lead Channel Pacing Threshold Amplitude: 1.25 V
Lead Channel Sensing Intrinsic Amplitude: 4 mV
Lead Channel Sensing Intrinsic Amplitude: 5.6 mV
Lead Channel Setting Pacing Amplitude: 2 V
Lead Channel Setting Pacing Amplitude: 2.5 V
Lead Channel Setting Pacing Pulse Width: 0.64 ms

## 2013-09-30 MED ORDER — METOPROLOL TARTRATE 25 MG PO TABS: 25.0000 mg | ORAL_TABLET | Freq: Every day | ORAL | Status: DC

## 2013-09-30 NOTE — Patient Instructions (Signed)
   Decrease Metoprolol to 25mg  daily - new sent to pharm  Continue all other medications.   Carelink remote checks every 3 month Your physician wants you to follow up in:  1 year.  You will receive a reminder letter in the mail one-two months in advance.  If you don't receive a letter, please call our office to schedule the follow up appointment - [facility]  Follow up with Dr. Diona Browner for January 2015

## 2013-10-02 ENCOUNTER — Other Ambulatory Visit: Payer: Self-pay | Admitting: *Deleted

## 2013-10-02 MED ORDER — METOPROLOL TARTRATE 25 MG PO TABS: 25.0000 mg | ORAL_TABLET | Freq: Every day | ORAL | Status: DC

## 2013-10-02 NOTE — Progress Notes (Signed)
PCP: Toma Deiters, MD  [name] is a 78 y.o. male who presents today for routine electrophysiology followup.  Since last being seen in our clinic, the patient reports doing very well.  He is limited by DJD.  He struggles with his weight.  He has occasional fatigue. Today, he denies symptoms of palpitations, chest pain, shortness of breath (above baseline),  lower extremity edema,  presyncope, or syncope.  The patient is otherwise without complaint today.   Past Medical History  Diagnosis Date  . Diabetes mellitus   . Hypertension   . Carotid sinus syndrome   . Other and unspecified hyperlipidemia   . Unspecified essential hypertension   . Shortness of breath   . Chest pain, unspecified   . Atherosclerosis of aorta   . Type II or unspecified type diabetes mellitus without mention of complication, not stated as uncontrolled   . Obstructive sleep apnea      CPAP   Past Surgical History  Procedure Laterality Date  . Pacemaker implanted   07/16/2005    Current Outpatient Prescriptions  Medication Sig Dispense Refill  . allopurinol (ZYLOPRIM) 300 MG tablet Take 300 mg by mouth daily.        Marland Kitchen aspirin 81 MG tablet Take 81 mg by mouth daily.        . carvedilol (COREG) 6.25 MG tablet Take 6.25 mg by mouth 2 (two) times daily with a meal.      . colchicine 0.6 MG tablet Take 0.6 mg by mouth as needed.      . fenofibrate (TRICOR) 145 MG tablet Take 145 mg by mouth daily.        . furosemide (LASIX) 20 MG tablet Take 20 mg by mouth daily.       Marland Kitchen glimepiride (AMARYL) 2 MG tablet Take 2 mg by mouth daily before breakfast.      . levETIRAcetam (KEPPRA) 750 MG tablet Take 750 mg by mouth daily.      Marland Kitchen losartan (COZAAR) 50 MG tablet Take 50 mg by mouth daily.      Marland Kitchen omeprazole (PRILOSEC) 40 MG capsule Take 40 mg by mouth daily.        . probenecid (BENEMID) 500 MG tablet Take 1,000 mg by mouth daily.       . metoprolol tartrate (LOPRESSOR) 25 MG tablet Take 1 tablet (25 mg total) by  mouth daily.  30 tablet  6  . warfarin (COUMADIN) 5 MG tablet Take 5 mg by mouth as directed. Managed by Dr. Bartholomew Crews office       No current facility-administered medications for this visit.    Physical Exam: Filed Vitals:   09/30/13 0820  BP: 115/73  Pulse: 67  Height: 6\' 1"  (1.854 m)  Weight: 299 lb (135.626 kg)    GEN- The patient is overweight appearing, alert and oriented x 3 today.   Head- normocephalic, atraumatic Eyes-  Sclera clear, conjunctiva pink Ears- hearing intact Oropharynx- clear Lungs- Clear to ausculation bilaterally, normal work of breathing Chest- pacemaker pocket is well healed Heart- Regular rate and rhythm, no murmurs, rubs or gallops, PMI not laterally displaced GI- soft, NT, ND, + BS Extremities- no clubbing, cyanosis, or edema  Pacemaker interrogation- reviewed in detail today,  See PACEART report  Assessment and Plan:  1. Bradycardia Normal pacemaker function See Pace Art report No changes today I am concerned that his fatigue is due to combination of coreg and metoprolol. I will decrease metoprolol to 25mg  at this  time.  I think that this should be eventually discontinued.  2. Prior PTE Continue long term anticoagulation with coumadin   carelink every 3 months I will see again in a year

## 2013-10-26 IMAGING — US IR IVC FILTER PLMT / S&I /IMG GUID/MOD SED
1 series · 1 of 1 positions shown · non-contrast
Comparison: none

Clinical Data/Indication:  RECENT ACUTE SUBDURAL HEMATOMA.  ACUTE
PULMONARY THROMBOEMBOLISM.

IR IVC FILTER PLACEMENT/ S+I/ IMAGE GUIDE MODERATE SEDATION
Sedation: Versed 1.0 mg, Fentanyl 50 mcg.
Total Moderate Sedation Time: 15 minutes.
Contrast Volume: 40 ml [id number]-F11.
Fluoroscopy Time: 1.2 minutes.
Procedure: The procedure, risks, benefits, and alternatives were
explained to the patient. Questions regarding the procedure were
encouraged and answered. The patient understands and consents to
the procedure.
The right neck was prepped with Betadine in a sterile fashion, and
a sterile drape was applied covering the operative field. A sterile
gown and sterile gloves were used for the procedure.
The right internal jugular vein was noted to be patent initially
with ultrasound.  Under sonographic guidance, a micropuncture
needle was inserted into the right internal jugular vein
(Ultrasound image documentation was performed). It was removed over
an 018 wire which was upsized to Ryo Hartmann.  The sheath was inserted
over the wire and into the IVC.  IVC venography was performed.
The temporary filter was then deployed in the infrarenal IVC.  The
sheath was removed and hemostasis was achieved with direct
pressure.

[Series 1: ir ivc filter plmt / s&i /img guid/mod sed · 1 of 1 slices shown]
[im 1/1]
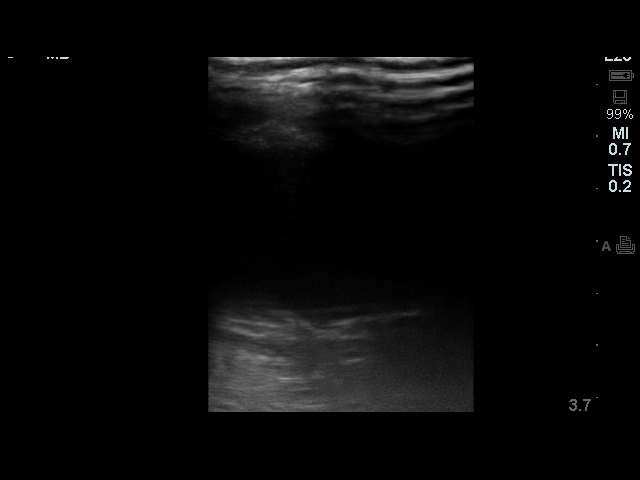

[1 of 1 positions shown; findings below may reference images not displayed]

FINDINGS: IVC venography demonstrates no venous anomaly and renal
vein inflow at the L1 superior endplate.  No DVT.

The image demonstrates placement of an IVC filter with its tip at
the L1 superior endplate.

Complications: None
IMPRESSION: Successful infrarenal IVC filter placement.  This is a temporary
filter.  It can be removed or remain in place to become permanent.

## 2013-11-27 ENCOUNTER — Encounter: Payer: Self-pay | Admitting: Cardiology

## 2013-11-27 ENCOUNTER — Ambulatory Visit (INDEPENDENT_AMBULATORY_CARE_PROVIDER_SITE_OTHER): Payer: Medicare HMO | Admitting: Cardiology

## 2013-11-27 VITALS — BP 109/71 | HR 78 | Ht 73.0 in | Wt 296.0 lb

## 2013-11-27 DIAGNOSIS — Z8679 Personal history of other diseases of the circulatory system: Secondary | ICD-10-CM | POA: Insufficient documentation

## 2013-11-27 DIAGNOSIS — R0602 Shortness of breath: Secondary | ICD-10-CM

## 2013-11-27 DIAGNOSIS — G9001 Carotid sinus syncope: Secondary | ICD-10-CM

## 2013-11-27 DIAGNOSIS — I2699 Other pulmonary embolism without acute cor pulmonale: Secondary | ICD-10-CM

## 2013-11-27 DIAGNOSIS — I2510 Atherosclerotic heart disease of native coronary artery without angina pectoris: Secondary | ICD-10-CM

## 2013-11-27 DIAGNOSIS — E782 Mixed hyperlipidemia: Secondary | ICD-10-CM

## 2013-11-27 MED ORDER — CARVEDILOL 6.25 MG PO TABS: 9.3750 mg | ORAL_TABLET | Freq: Two times a day (BID) | ORAL | Status: DC

## 2013-11-27 NOTE — Assessment & Plan Note (Signed)
Currently on TriCor, followed by Dr. Olena LeatherwoodHasanaj.

## 2013-11-27 NOTE — Assessment & Plan Note (Addendum)
No active angina, history of DES to the posterolateral and 2006 with mild to moderate residual disease a followup catheterization in 2010. ECG shows normal sinus rhythm. He is on aspirin, two different beta blockers, Cozaar, and Coumadin. Plan to increase Coreg 6.25 mg to 1-1/2 tablets a day and stop Lopressor.

## 2013-11-27 NOTE — Assessment & Plan Note (Signed)
Blood pressure is well-controlled today. 

## 2013-11-27 NOTE — Assessment & Plan Note (Signed)
Prior history of syncope, status post pacemaker placement, followed by Dr. Johney FrameAllred.

## 2013-11-27 NOTE — Patient Instructions (Addendum)
Your physician has requested that you have an echocardiogram. Echocardiography is a painless test that uses sound waves to create images of your heart. It provides your doctor with information about the size and shape of your heart and how well your heart's chambers and valves are working. This procedure takes approximately one hour. There are no restrictions for this procedure. Office will contact with results via phone or letter.   Stop Lopressor Increase Coreg to 1 1/2 tabs (9.375mg ) twice a day (take 1 1/2 tabs of the 6.25mg  tablet twice a day) - [facility] Continue all other current medications. Follow up in  1 month

## 2013-11-27 NOTE — Assessment & Plan Note (Addendum)
Complicated history, outlined above. He has been on Coumadin since last year [facility]. Coumadin is being followed and managed by Dr. Olena LeatherwoodHasanaj. He had evidence of severe pulmonary hypertension by echocardiogram in March of last year, followup echocardiogram will be repeated. He is also on chronic oxygen, presumably due to documented exertional hypoxia.

## 2013-11-27 NOTE — Progress Notes (Signed)
Clinical Summary Mr. Tiedt is a medically complex 79 y.o.male presenting for an office visit. He is a former patient of Dr. Andee Lineman last seen in March 2014, records reviewed. More recently he had an interval followup visit with Dr. Johney Frame for device interrogation in November 2014.  Complex medical history noted below includes subdural hematoma (on Coumadin), recurring pulmonary emboli despite IVC filter (off anticoagulation at that time) and requiring thrombolytic therapy, cautious resumption of Coumadin at [facility] March 2014.  He is here with his wife today. He reports NYHA class III dyspnea on exertion that has been a persistent problem. He also feels dizziness, no chest pain or palpitations. Reports systolic blood pressure ranging from 100 to 130 at home. States that it has not been under 100 when he has been dizzy. He states that he uses oxygen around the clock, although was not wearing it today. He is followed by Dr. Olena Leatherwood.  No followup echocardiogram since a study back in March 2014 which was significantly abnormal showing severe pulmonary hypertension in the setting of pulmonary emboli.  ECG today shows sinus rhythm, no significant ST segment c hanges.  Allergies  Allergen Reactions  . Nitroglycerin     REACTION: BP decrease    Current Outpatient Prescriptions  Medication Sig Dispense Refill  . allopurinol (ZYLOPRIM) 300 MG tablet Take 300 mg by mouth daily.        Marland Kitchen aspirin 81 MG tablet Take 81 mg by mouth daily.        . carvedilol (COREG) 6.25 MG tablet Take 6.25 mg by mouth 2 (two) times daily with a meal.      . colchicine 0.6 MG tablet Take 0.6 mg by mouth as needed.      . fenofibrate (TRICOR) 145 MG tablet Take 145 mg by mouth daily.        . furosemide (LASIX) 20 MG tablet Take 20 mg by mouth daily.       Marland Kitchen glimepiride (AMARYL) 2 MG tablet Take 2 mg by mouth daily before breakfast.      . levETIRAcetam (KEPPRA) 750 MG tablet Take 750 mg by mouth daily.      Marland Kitchen losartan  (COZAAR) 50 MG tablet Take 50 mg by mouth daily.      Marland Kitchen omeprazole (PRILOSEC) 40 MG capsule Take 40 mg by mouth daily.        . probenecid (BENEMID) 500 MG tablet Take 1,000 mg by mouth daily.       Marland Kitchen warfarin (COUMADIN) 5 MG tablet Take 5 mg by mouth as directed. Managed by Dr. Bartholomew Crews office       No current facility-administered medications for this visit.    Past Medical History  Diagnosis Date  . Type 2 diabetes mellitus   . Essential hypertension, benign   . Carotid sinus syndrome     Status post pacemaker  . Mixed hyperlipidemia   . Obstructive sleep apnea     CPAP  . Pulmonary hypertension     Severe, RVSP 85-90 mmHg March 2014   . Coronary atherosclerosis of native coronary artery     DES to PL 2006, mild to moderate residual disease 2010   . Subdural hematoma     January 2014, occurred on Coumadin  . Pulmonary emboli     Diagnosed 2012 and 2014, IVC filter March 2014, recurrent March 2014 - thromboembolic therapy at [facility] and placed back on anticoagulation    Past Surgical History  Procedure Laterality Date  .  Pacemaker implanted   07/16/2005    [employer]onic Kappa 900 DR    Family History  Problem Relation Age of Onset  . Diabetes    . Hypertension      Social History [name] reports that he has never smoked. He has never used smokeless tobacco. [name] reports that he does not drink alcohol.  Review of Systems No cough or hemoptysis, no fevers or chills. Stable appetite. Otherwise as outlined above.  Physical Examination Filed Vitals:   11/27/13 1253  BP: 109/71  Pulse: 78   Filed Weights   11/27/13 1253  Weight: 296 lb (134.265 kg)   Morbidly obese male, no distress at present. HEENT: Conjunctiva and lids normal, oropharynx clear. Neck: Supple, increased girth with difficult to assess JVP, no carotid bruits, no thyromegaly. Lungs: Clear to auscultation, nonlabored breathing at rest. Cardiac: Regular rate and rhythm, no S3, soft apical  systolic murmur, no pericardial rub. Abdomen: Soft, nontender, protuberant, bowel sounds present. Extremities: Chronic appearing edema, distal pulses 1-2+. Skin: Warm and dry. Musculoskeletal: No kyphosis. Neuropsychiatric: Alert and oriented x3, affect grossly appropriate.   Problem List and Plan   Coronary atherosclerosis of native coronary artery No active angina, history of DES to the posterolateral and 2006 with mild to moderate residual disease a followup catheterization in 2010. ECG shows normal sinus rhythm. He is on aspirin, two different beta blockers, Cozaar, and Coumadin. Plan to increase Coreg 6.25 mg to 1-1/2 tablets a day and stop Lopressor.  Essential hypertension, benign Blood pressure is well-controlled today.  Recurrent pulmonary emboli Complicated history, outlined above. He has been on Coumadin since last year [facility]. Coumadin is being followed and managed by Dr. Olena LeatherwoodHasanaj. He had evidence of severe pulmonary hypertension by echocardiogram in March of last year, followup echocardiogram will be repeated. He is also on chronic oxygen, presumably due to documented exertional hypoxia.  Carotid sinus syndrome Prior history of syncope, status post pacemaker placement, followed by Dr. Johney FrameAllred.  HYPERLIPIDEMIA-MIXED Currently on TriCor, followed by Dr. Olena LeatherwoodHasanaj.    Jonelle SidleSamuel G. Nury Nebergall, M.D., F.A.C.C.

## 2013-11-30 ENCOUNTER — Other Ambulatory Visit (HOSPITAL_COMMUNITY): Payer: Self-pay | Admitting: Internal Medicine

## 2013-11-30 DIAGNOSIS — G459 Transient cerebral ischemic attack, unspecified: Secondary | ICD-10-CM

## 2013-12-03 ENCOUNTER — Ambulatory Visit (HOSPITAL_COMMUNITY)
Admission: RE | Admit: 2013-12-03 | Discharge: 2013-12-03 | Disposition: A | Discharge status: Home / Self Care | Payer: Medicare HMO | Source: Ambulatory Visit | Attending: Internal Medicine | Admitting: Internal Medicine

## 2013-12-03 DIAGNOSIS — R42 Dizziness and giddiness: Secondary | ICD-10-CM | POA: Insufficient documentation

## 2013-12-03 DIAGNOSIS — G459 Transient cerebral ischemic attack, unspecified: Secondary | ICD-10-CM

## 2013-12-17 ENCOUNTER — Other Ambulatory Visit (INDEPENDENT_AMBULATORY_CARE_PROVIDER_SITE_OTHER): Payer: Medicare HMO

## 2013-12-17 ENCOUNTER — Other Ambulatory Visit: Payer: Self-pay

## 2013-12-17 DIAGNOSIS — R0602 Shortness of breath: Secondary | ICD-10-CM

## 2013-12-17 DIAGNOSIS — I2510 Atherosclerotic heart disease of native coronary artery without angina pectoris: Secondary | ICD-10-CM

## 2013-12-18 ENCOUNTER — Telehealth: Payer: Self-pay | Admitting: *Deleted

## 2013-12-18 NOTE — Telephone Encounter (Signed)
Notes Recorded by Lesle ChrisAngela G Kwesi Sangha, LPN on 3/08/65781/30/2015 at 4:14 PM Wife notified & reminded of follow up OV with Dr. Diona BrownerMcDowell 01/05/14. ------  Notes Recorded by Lesle ChrisAngela G Hanifah Royse, LPN on 4/69/62951/30/2015 at 12:09 PM Left message to return call.

## 2013-12-18 NOTE — Telephone Encounter (Signed)
Message copied by Lesle ChrisHILL, Cotina Freedman G on Fri Dec 18, 2013  4:14 PM ------      Message from: MCDOWELL, Illene BolusSAMUEL G      Created: Thu Dec 17, 2013 10:52 AM       Reviewed. Please let him know that LVEF remains normal. Previous evidence of severe pulmonary hypertension has improved significantly. This is a good finding in light of his shortness of breath. Continue medical therapy. ------

## 2013-12-31 ENCOUNTER — Encounter: Payer: Medicare Other | Admitting: *Deleted

## 2013-12-31 ENCOUNTER — Encounter: Payer: Self-pay | Admitting: Internal Medicine

## 2013-12-31 ENCOUNTER — Ambulatory Visit (INDEPENDENT_AMBULATORY_CARE_PROVIDER_SITE_OTHER): Payer: Medicare HMO | Admitting: *Deleted

## 2013-12-31 DIAGNOSIS — I498 Other specified cardiac arrhythmias: Secondary | ICD-10-CM

## 2014-01-05 ENCOUNTER — Ambulatory Visit: Payer: Medicare HMO | Admitting: Cardiology

## 2014-01-06 ENCOUNTER — Encounter: Payer: Self-pay | Admitting: *Deleted

## 2014-01-28 ENCOUNTER — Encounter: Payer: Self-pay | Admitting: Cardiology

## 2014-01-28 ENCOUNTER — Ambulatory Visit (INDEPENDENT_AMBULATORY_CARE_PROVIDER_SITE_OTHER): Payer: Medicare HMO | Admitting: Cardiology

## 2014-01-28 VITALS — BP 112/64 | HR 79 | Ht 73.0 in | Wt 298.0 lb

## 2014-01-28 DIAGNOSIS — Z950 Presence of cardiac pacemaker: Secondary | ICD-10-CM

## 2014-01-28 DIAGNOSIS — I2699 Other pulmonary embolism without acute cor pulmonale: Secondary | ICD-10-CM

## 2014-01-28 DIAGNOSIS — I10 Essential (primary) hypertension: Secondary | ICD-10-CM

## 2014-01-28 DIAGNOSIS — I2510 Atherosclerotic heart disease of native coronary artery without angina pectoris: Secondary | ICD-10-CM

## 2014-01-28 NOTE — Assessment & Plan Note (Signed)
Blood pressure is very well controlled today. 

## 2014-01-28 NOTE — Assessment & Plan Note (Signed)
Symptomatically stable medical therapy with history of DES to the posterolateral in 2006 with mild to moderate residual disease a followup catheterization in 2010. LVEF 60-65% by recent echocardiogram. Continue medical therapy and observation.

## 2014-01-28 NOTE — Assessment & Plan Note (Signed)
Complicated history. He has been on Coumadin since last year [facility]. Coumadin is being followed and managed by Dr. Olena LeatherwoodHasanaj. He had evidence of severe pulmonary hypertension by echocardiogram in March of last year, although this has improved significantly by recent study.

## 2014-01-28 NOTE — Assessment & Plan Note (Signed)
Followed by Dr. Johney FrameAllred. I have asked nursing to check with the EP clinic regarding remote followup of his device.

## 2014-01-28 NOTE — Patient Instructions (Addendum)
Your physician recommends that you schedule a follow-up appointment in: 4 months. You will receive a reminder letter in the mail in about 1-2 months reminding you to call and schedule your appointment. If you don't receive this letter, please contact our office. Your physician recommends that you continue on your current medications as directed. Please refer to the Current Medication list given to you today. The Device Clinic was contacted today about your recent transmission. They should be contacting you about this.

## 2014-01-28 NOTE — Progress Notes (Signed)
Clinical Summary [name] is a medically complex 79 y.o.male last seen in January of this year. Fortunately, he has been doing better in terms of shortness of breath, does not endorse any chest pain or palpitations. No bleeding problems on Coumadin which is being followed by Dr. Olena LeatherwoodHasanaj.  Complex medical history includes subdural hematoma (on Coumadin), recurring pulmonary emboli despite IVC filter (off anticoagulation at that time) and requiring thrombolytic therapy, [facility].  Echocardiogram from January 2015 demonstrated mild LVH with LVEF 60-65%, basal inferior hypokinesis, grade 1 diastolic dysfunction, mild left atrial enlargement,  MAC and mild mitral regurgitation, device wire in the right heart, mild tricuspid rotation, RV-RA gradient 38 mm mercury - significantly improved in comparison to previous study report March 2014. I reviewed this with him today.  He tells me that he did a remote device check in mid February, although records indicate that our office did not receive his transmission. I have asked nursing to check into this with the EP clinic   Allergies  Allergen Reactions  . Nitroglycerin     REACTION: BP decrease    Current Outpatient Prescriptions  Medication Sig Dispense Refill  . allopurinol (ZYLOPRIM) 300 MG tablet Take 300 mg by mouth daily.        Marland Kitchen. aspirin 81 MG tablet Take 81 mg by mouth daily.        . carvedilol (COREG) 6.25 MG tablet Take 1.5 tablets (9.375 mg total) by mouth 2 (two) times daily with a meal.  270 tablet  3  . colchicine 0.6 MG tablet Take 0.6 mg by mouth as needed.      . fenofibrate (TRICOR) 145 MG tablet Take 145 mg by mouth daily.        . furosemide (LASIX) 20 MG tablet Take 20 mg by mouth daily.       Marland Kitchen. glimepiride (AMARYL) 2 MG tablet Take 2 mg by mouth daily before breakfast.      . levETIRAcetam (KEPPRA) 750 MG tablet Take 750 mg by mouth daily.      Marland Kitchen. losartan (COZAAR) 50 MG tablet Take  50 mg by mouth daily.      . Omega-3 Fatty Acids (FISH OIL) 1000 MG CAPS Take 1 capsule by mouth 3 (three) times daily.      Marland Kitchen. omeprazole (PRILOSEC) 40 MG capsule Take 40 mg by mouth daily.        . probenecid (BENEMID) 500 MG tablet Take 1,000 mg by mouth daily.       Marland Kitchen. warfarin (COUMADIN) 5 MG tablet Take 5 mg by mouth as directed. Managed by Dr. Bartholomew CrewsHasanaj's office       No current facility-administered medications for this visit.    Past Medical History  Diagnosis Date  . Type 2 diabetes mellitus   . Essential hypertension, benign   . Carotid sinus syndrome     Status post pacemaker  . Mixed hyperlipidemia   . Obstructive sleep apnea     CPAP  . Pulmonary hypertension     Severe, RVSP 85-90 mmHg March 2014   . Coronary atherosclerosis of native coronary artery     DES to PL 2006, mild to moderate residual disease 2010   . Subdural hematoma     January 2014, occurred on Coumadin  . Pulmonary emboli     Diagnosed 2012 and 2014, IVC filter March 2014, recurrent March 2014 - throm[facility] and placed back on anticoagulation  Past Surgical History  Procedure Laterality Date  . Pacemaker implanted   07/16/2005    Medtronic Kappa 900 DR    Social History Mr. Artman reports that he has never smoked. He has never used smokeless tobacco. Mr. Mangold reports that he does not drink alcohol.  Review of Systems Negative except as outlined.  Physical Examination Filed Vitals:   01/28/14 1458  BP: 112/64  Pulse: 79   Filed Weights   01/28/14 1458  Weight: 298 lb (135.172 kg)    Morbidly obese male, no distress at present.  HEENT: Conjunctiva and lids normal, oropharynx clear.  Neck: Supple, increased girth with difficult to assess JVP, no carotid bruits, no thyromegaly.  Lungs: Clear to auscultation, nonlabored breathing at rest.  Cardiac: Regular rate and rhythm, no S3, soft apical systolic murmur, no pericardial rub.  Abdomen: Soft, nontender, protuberant,  bowel sounds present.  Extremities: Chronic appearing edema, distal pulses 1-2+.  Skin: Warm and dry.  Musculoskeletal: No kyphosis.  Neuropsychiatric: Alert and oriented x3, affect grossly appropriate.   Problem List and Plan   Coronary atherosclerosis of native coronary artery Symptomatically stable medical therapy with history of DES to the posterolateral in 2006 with mild to moderate residual disease a followup catheterization in 2010. LVEF 60-65% by recent echocardiogram. Continue medical therapy and observation.    Recurrent pulmonary emboli Complicated history. He has been on Coumadin since last year following hospitalization at [facility]. Coumadin is being followed and managed by Dr. Olena Leatherwood. He had evidence of severe pulmonary hypertension by echocardiogram in March of last year, although this has improved significantly by recent study.  Pacemaker-Medtronic Followed by Dr. Johney Frame. I have asked nursing to check with the EP clinic regarding remote followup of his device.  Essential hypertension, benign Blood pressure is very well controlled today.    Jonelle Sidle, M.D., F.A.C.C.

## 2014-02-09 LAB — MDC_IDC_ENUM_SESS_TYPE_REMOTE
Brady Statistic AP VS Percent: 7 %
Brady Statistic AS VS Percent: 93 %
Lead Channel Impedance Value: 550 Ohm
Lead Channel Pacing Threshold Amplitude: 1.375 V
Lead Channel Pacing Threshold Pulse Width: 0.4 ms
Lead Channel Sensing Intrinsic Amplitude: 2.8 mV
Lead Channel Setting Pacing Pulse Width: 0.4 ms
MDC IDC MSMT BATTERY IMPEDANCE: 2079 Ohm
MDC IDC MSMT BATTERY REMAINING LONGEVITY: 27 mo
MDC IDC MSMT BATTERY VOLTAGE: 2.7 V
MDC IDC MSMT LEADCHNL RA IMPEDANCE VALUE: 392 Ohm
MDC IDC MSMT LEADCHNL RV SENSING INTR AMPL: 5.6 mV
MDC IDC SESS DTM: 20150212141901
MDC IDC SET LEADCHNL RA PACING AMPLITUDE: 2 V
MDC IDC SET LEADCHNL RV PACING AMPLITUDE: 2.75 V
MDC IDC SET LEADCHNL RV SENSING SENSITIVITY: 2.8 mV
MDC IDC STAT BRADY AP VP PERCENT: 0 %
MDC IDC STAT BRADY AS VP PERCENT: 0 %

## 2014-02-15 ENCOUNTER — Encounter (HOSPITAL_COMMUNITY): Payer: Self-pay

## 2014-02-15 ENCOUNTER — Encounter (HOSPITAL_COMMUNITY)
Admission: RE | Admit: 2014-02-15 | Discharge: 2014-02-15 | Disposition: A | Discharge status: Home / Self Care | Payer: Medicare HMO | Source: Ambulatory Visit | Attending: Ophthalmology | Admitting: Ophthalmology

## 2014-02-15 ENCOUNTER — Encounter (HOSPITAL_COMMUNITY): Payer: Self-pay | Admitting: Pharmacy Technician

## 2014-02-15 HISTORY — DX: Cerebral infarction, unspecified: I63.9

## 2014-02-15 HISTORY — DX: Presence of cardiac pacemaker: Z95.0

## 2014-02-15 LAB — CBC
HEMATOCRIT: 37.5 % — AB (ref 39.0–52.0)
Hemoglobin: 12.3 g/dL — ABNORMAL LOW (ref 13.0–17.0)
MCH: 31.7 pg (ref 26.0–34.0)
MCHC: 32.8 g/dL (ref 30.0–36.0)
MCV: 96.6 fL (ref 78.0–100.0)
Platelets: 159 10*3/uL (ref 150–400)
RBC: 3.88 MIL/uL — ABNORMAL LOW (ref 4.22–5.81)
RDW: 16.2 % — AB (ref 11.5–15.5)
WBC: 6 10*3/uL (ref 4.0–10.5)

## 2014-02-15 LAB — BASIC METABOLIC PANEL
BUN: 21 mg/dL (ref 6–23)
CO2: 28 mEq/L (ref 19–32)
CREATININE: 1.3 mg/dL (ref 0.50–1.35)
Calcium: 8.6 mg/dL (ref 8.4–10.5)
Chloride: 104 mEq/L (ref 96–112)
GFR, EST AFRICAN AMERICAN: 60 mL/min — AB (ref >90)
GFR, EST NON AFRICAN AMERICAN: 52 mL/min — AB (ref >90)
Glucose, Bld: 153 mg/dL — ABNORMAL HIGH (ref 70–99)
Potassium: 4.1 mEq/L (ref 3.7–5.3)
Sodium: 144 mEq/L (ref 137–147)

## 2014-02-15 NOTE — Patient Instructions (Signed)
Cataract A cataract is a clouding of the lens of the eye. When a lens becomes cloudy, vision is reduced based on the degree and nature of the clouding. Many cataracts reduce vision to some degree. Some cataracts make people more near-sighted as they develop. Other cataracts increase glare. Cataracts that are ignored and become worse can sometimes look white. The white color can be seen through the pupil. CAUSES   Aging. However, cataracts may occur at any age, even in newborns.  Certain drugs.  Trauma to the eye.  Certain diseases such as diabetes.  Specific eye diseases such as chronic inflammation inside the eye or a sudden attack of a rare form of glaucoma.  Inherited or acquired medical problems. SYMPTOMS   Gradual, progressive drop in vision in the affected eye.  Severe, rapid visual loss. This most often happens when trauma is the cause. DIAGNOSIS  To detect a cataract, an eye doctor examines the lens. Cataracts are best diagnosed with an exam of the eyes with the pupils enlarged (dilated) by drops.  TREATMENT  For an early cataract, vision may improve by using different eyeglasses or stronger lighting. If that does not help your vision, surgery is the only effective treatment. A cataract needs to be surgically removed when vision loss interferes with your everyday activities, such as driving, reading, or watching TV. A cataract may also have to be removed if it prevents examination or treatment of another eye problem. Surgery removes the cloudy lens and usually replaces it with a substitute lens (intraocular lens, IOL).  At a time when both you and your doctor agree, the cataract will be surgically removed. If you have cataracts in both eyes, only one is usually removed at a time. This allows the operated eye to heal and be out of danger from any possible problems after surgery (such as infection or poor wound healing). In rare cases, a cataract may be doing damage to your eye. In  these cases, your caregiver may advise surgical removal right away. The vast majority of people who have cataract surgery have better vision afterward. HOME CARE INSTRUCTIONS  If you are not planning surgery, you may be asked to do the following:  Use different eyeglasses.  Use stronger or brighter lighting.  Ask your eye doctor about reducing your medicine dose or changing medicines if it is thought that a medicine caused your cataract. Changing medicines does not make the cataract go away on its own.  Become familiar with your surroundings. Poor vision can lead to injury. Avoid bumping into things on the affected side. You are at a higher risk for tripping or falling.  Exercise extreme care when driving or operating machinery.  Wear sunglasses if you are sensitive to bright light or experiencing problems with glare. SEEK IMMEDIATE MEDICAL CARE IF:   You have a worsening or sudden vision loss.  You notice redness, swelling, or increasing pain in the eye.  You have a fever. Document Released: 11/05/2005 Document Revised: 01/28/2012 Document Reviewed: 06/29/2011 [facility] Patient Information 2014 [place], [place]. Cataract Surgery Care After Refer to this sheet in the next few weeks. These instructions provide you with information on caring for yourself after your procedure. Your caregiver may also give you more specific instructions. Your treatment has been planned according to current medical practices, but problems sometimes occur. Call your caregiver if you have any problems or questions after your procedure.  HOME CARE INSTRUCTIONS   Avoid strenuous activities as directed by your caregiver.  Ask  your caregiver when you can resume driving.  Use eyedrops or other medicines to help healing and control pressure inside your eye as directed by your caregiver.  Only take over-the-counter or prescription medicines for pain, discomfort, or fever as directed by your caregiver.  Do not to  touch or rub your eyes.  You may be instructed to use a protective shield during the first few days and nights after surgery. If not, wear sunglasses to protect your eyes. This is to protect the eye from pressure or from being accidentally bumped.  Keep the area around your eye clean and dry. Avoid swimming or allowing water to hit you directly in the face while showering. Keep soap and shampoo out of your eyes.  Do not bend or lift heavy objects. Bending increases pressure in the eye. You can walk, climb stairs, and do light household chores.  Do not put a contact lens into the eye that had surgery until your caregiver says it is okay to do so.  Ask your doctor when you can return to work. This will depend on the kind of work that you do. If you work in a dusty environment, you may be advised to wear protective eyewear for a period of time.  Ask your caregiver when it will be safe to engage in sexual activity.  Continue with your regular eye exams as directed by your caregiver. What to expect:  It is normal to feel itching and mild discomfort for a few days after cataract surgery. Some fluid discharge is also common, and your eye may be sensitive to light and touch.  After 1 to 2 days, even moderate discomfort should disappear. In most cases, healing will take about 6 weeks.  If you received an intraocular lens (IOL), you may notice that colors are very bright or have a blue tinge. Also, if you have been in bright sunlight, everything may appear reddish for a few hours. If you see these color tinges, it is because your lens is clear and no longer cloudy. Within a few months after receiving an IOL, these extra colors should go away. When you have healed, you will probably need new glasses. SEEK MEDICAL CARE IF:   You have increased bruising around your eye.  You have discomfort not helped by medicine. SEEK IMMEDIATE MEDICAL CARE IF:   You have a fever.  You have a worsening or sudden  vision loss.  You have redness, swelling, or increasing pain in the eye.  You have a thick discharge from the eye that had surgery. MAKE SURE YOU:  Understand these instructions.  Will watch your condition.  Will get help right away if you are not doing well or get worse. Document Released: 05/25/2005 Document Revised: 01/28/2012 Document Reviewed: 06/29/2011 [facility] Patient Information 2014 [place].

## 2014-02-17 MED ORDER — PHENYLEPHRINE HCL 2.5 % OP SOLN
OPHTHALMIC | Status: AC
  Filled 2014-02-17: qty 15

## 2014-02-17 MED ORDER — LIDOCAINE HCL (PF) 1 % IJ SOLN
INTRAMUSCULAR | Status: AC
  Filled 2014-02-17: qty 2

## 2014-02-17 MED ORDER — NEOMYCIN-POLYMYXIN-DEXAMETH 3.5-10000-0.1 OP SUSP
OPHTHALMIC | Status: AC
  Filled 2014-02-17: qty 5

## 2014-02-17 MED ORDER — LIDOCAINE HCL 3.5 % OP GEL
OPHTHALMIC | Status: AC
  Filled 2014-02-17: qty 1

## 2014-02-17 MED ORDER — TETRACAINE HCL 0.5 % OP SOLN
OPHTHALMIC | Status: AC
  Filled 2014-02-17: qty 2

## 2014-02-17 MED ORDER — CYCLOPENTOLATE-PHENYLEPHRINE OP SOLN OPTIME - NO CHARGE
OPHTHALMIC | Status: AC
  Filled 2014-02-17: qty 2

## 2014-02-18 ENCOUNTER — Encounter (HOSPITAL_COMMUNITY)
Admission: RE | Disposition: A | Discharge status: Home / Self Care | Payer: Self-pay | Source: Ambulatory Visit | Attending: Ophthalmology

## 2014-02-18 ENCOUNTER — Ambulatory Visit (HOSPITAL_COMMUNITY): Payer: Medicare HMO | Admitting: Anesthesiology

## 2014-02-18 ENCOUNTER — Encounter (HOSPITAL_COMMUNITY): Payer: Medicare HMO | Admitting: Anesthesiology

## 2014-02-18 ENCOUNTER — Encounter (HOSPITAL_COMMUNITY): Payer: Self-pay

## 2014-02-18 ENCOUNTER — Ambulatory Visit (HOSPITAL_COMMUNITY)
Admission: RE | Admit: 2014-02-18 | Discharge: 2014-02-18 | Disposition: A | Discharge status: Home / Self Care | Payer: Medicare HMO | Source: Ambulatory Visit | Attending: Ophthalmology | Admitting: Ophthalmology

## 2014-02-18 DIAGNOSIS — H2589 Other age-related cataract: Secondary | ICD-10-CM | POA: Insufficient documentation

## 2014-02-18 DIAGNOSIS — Z79899 Other long term (current) drug therapy: Secondary | ICD-10-CM | POA: Insufficient documentation

## 2014-02-18 DIAGNOSIS — Z7982 Long term (current) use of aspirin: Secondary | ICD-10-CM | POA: Insufficient documentation

## 2014-02-18 DIAGNOSIS — Z950 Presence of cardiac pacemaker: Secondary | ICD-10-CM | POA: Insufficient documentation

## 2014-02-18 DIAGNOSIS — E119 Type 2 diabetes mellitus without complications: Secondary | ICD-10-CM | POA: Insufficient documentation

## 2014-02-18 DIAGNOSIS — Z7901 Long term (current) use of anticoagulants: Secondary | ICD-10-CM | POA: Insufficient documentation

## 2014-02-18 DIAGNOSIS — I10 Essential (primary) hypertension: Secondary | ICD-10-CM | POA: Insufficient documentation

## 2014-02-18 HISTORY — PX: CATARACT EXTRACTION W/PHACO: SHX586

## 2014-02-18 LAB — GLUCOSE, CAPILLARY: Glucose-Capillary: 136 mg/dL — ABNORMAL HIGH (ref 70–99)

## 2014-02-18 SURGERY — PHACOEMULSIFICATION, CATARACT, WITH IOL INSERTION
Anesthesia: Monitor Anesthesia Care | Site: Eye | Laterality: Right

## 2014-02-18 MED ORDER — MIDAZOLAM HCL 2 MG/2ML IJ SOLN
1.0000 mg | INTRAMUSCULAR | Status: DC | PRN
  Administered 2014-02-18: 2 mg via INTRAVENOUS

## 2014-02-18 MED ORDER — MIDAZOLAM HCL 2 MG/2ML IJ SOLN
INTRAMUSCULAR | Status: AC
  Filled 2014-02-18: qty 2

## 2014-02-18 MED ORDER — NEOMYCIN-POLYMYXIN-DEXAMETH 3.5-10000-0.1 OP SUSP
OPHTHALMIC | Status: DC | PRN
  Administered 2014-02-18: 1 [drp] via OPHTHALMIC

## 2014-02-18 MED ORDER — LIDOCAINE HCL 3.5 % OP GEL
1.0000 "application " | Freq: Once | OPHTHALMIC | Status: AC
  Administered 2014-02-18: 1 via OPHTHALMIC

## 2014-02-18 MED ORDER — FENTANYL CITRATE 0.05 MG/ML IJ SOLN
25.0000 ug | INTRAMUSCULAR | Status: AC
  Administered 2014-02-18 (×2): 25 ug via INTRAVENOUS

## 2014-02-18 MED ORDER — EPINEPHRINE HCL 1 MG/ML IJ SOLN
INTRAOCULAR | Status: DC | PRN
  Administered 2014-02-18: 11:00:00

## 2014-02-18 MED ORDER — LIDOCAINE 3.5 % OP GEL OPTIME - NO CHARGE
OPHTHALMIC | Status: DC | PRN
  Administered 2014-02-18: 1 [drp] via OPHTHALMIC

## 2014-02-18 MED ORDER — PHENYLEPHRINE HCL 2.5 % OP SOLN
1.0000 [drp] | OPHTHALMIC | Status: AC
  Administered 2014-02-18 (×3): 1 [drp] via OPHTHALMIC

## 2014-02-18 MED ORDER — EPINEPHRINE HCL 1 MG/ML IJ SOLN
INTRAMUSCULAR | Status: AC
  Filled 2014-02-18: qty 1

## 2014-02-18 MED ORDER — CYCLOPENTOLATE-PHENYLEPHRINE 0.2-1 % OP SOLN
1.0000 [drp] | OPHTHALMIC | Status: AC
  Administered 2014-02-18 (×3): 1 [drp] via OPHTHALMIC

## 2014-02-18 MED ORDER — PROVISC 10 MG/ML IO SOLN
INTRAOCULAR | Status: DC | PRN
  Administered 2014-02-18: 0.85 mL via INTRAOCULAR

## 2014-02-18 MED ORDER — LIDOCAINE HCL (PF) 1 % IJ SOLN
INTRAMUSCULAR | Status: DC | PRN
  Administered 2014-02-18: .6 mL

## 2014-02-18 MED ORDER — POVIDONE-IODINE 5 % OP SOLN
OPHTHALMIC | Status: DC | PRN
  Administered 2014-02-18: 1 via OPHTHALMIC

## 2014-02-18 MED ORDER — LACTATED RINGERS IV SOLN
INTRAVENOUS | Status: DC
  Administered 2014-02-18: 10:00:00 via INTRAVENOUS

## 2014-02-18 MED ORDER — FENTANYL CITRATE 0.05 MG/ML IJ SOLN
INTRAMUSCULAR | Status: AC
  Filled 2014-02-18: qty 2

## 2014-02-18 MED ORDER — BSS IO SOLN
INTRAOCULAR | Status: DC | PRN
  Administered 2014-02-18: 15 mL via INTRAOCULAR

## 2014-02-18 MED ORDER — TETRACAINE HCL 0.5 % OP SOLN
1.0000 [drp] | OPHTHALMIC | Status: AC
  Administered 2014-02-18 (×3): 1 [drp] via OPHTHALMIC

## 2014-02-18 SURGICAL SUPPLY — 32 items

## 2014-02-18 NOTE — H&P (Signed)
I have reviewed the H&P, the patient was re-examined, and I have identified no interval changes in medical condition and plan of care since the history and physical of record  

## 2014-02-18 NOTE — Anesthesia Postprocedure Evaluation (Signed)
  Anesthesia Post-op Note  Patient: [name]  Procedure(s) Performed: Procedure(s) with comments: CATARACT EXTRACTION PHACO AND INTRAOCULAR LENS PLACEMENT (IOC) (Right) - CDE: 11.83  Patient Location: Short Stay  Anesthesia Type:MAC  Level of Consciousness: awake, alert , oriented and patient cooperative  Airway and Oxygen Therapy: Patient Spontanous Breathing  Post-op Pain: none  Post-op Assessment: Post-op Vital signs reviewed, Patient's Cardiovascular Status Stable, Respiratory Function Stable, Patent Airway and Pain level controlled  Post-op Vital Signs: Reviewed and stable  Complications: No apparent anesthesia complications

## 2014-02-18 NOTE — Anesthesia Preprocedure Evaluation (Addendum)
Anesthesia Evaluation  Patient identified by MRN, date of birth, ID band Patient awake    Reviewed: Allergy & Precautions, H&P , NPO status , Patient's Chart, lab work & pertinent test results, reviewed documented beta blocker date and time   Airway Mallampati: II TM Distance: >3 FB     Dental  (+) Edentulous Upper, Partial Lower   Pulmonary sleep apnea and Continuous Positive Airway Pressure Ventilation , PE breath sounds clear to auscultation        Cardiovascular hypertension, Pt. on home beta blockers and Pt. on medications + CAD and + Peripheral Vascular Disease (carotid) + pacemaker Rhythm:Regular Rate:Normal  pulm htn    Neuro/Psych CVA    GI/Hepatic   Endo/Other  diabetes  Renal/GU      Musculoskeletal   Abdominal   Peds  Hematology   Anesthesia Other Findings   Reproductive/Obstetrics                          Anesthesia Physical Anesthesia Plan  ASA: IV  Anesthesia Plan: MAC   Post-op Pain Management:    Induction: Intravenous  Airway Management Planned: Nasal Cannula  Additional Equipment:   Intra-op Plan:   Post-operative Plan:   Informed Consent: I have reviewed the patients History and Physical, chart, labs and discussed the procedure including the risks, benefits and alternatives for the proposed anesthesia with the patient or authorized representative who has indicated his/her understanding and acceptance.     Plan Discussed with:   Anesthesia Plan Comments:         Anesthesia Quick Evaluation

## 2014-02-18 NOTE — Transfer of Care (Signed)
Immediate Anesthesia Transfer of Care Note  Patient: [name]  Procedure(s) Performed: Procedure(s) with comments: CATARACT EXTRACTION PHACO AND INTRAOCULAR LENS PLACEMENT (IOC) (Right) - CDE: 11.83  Patient Location: PACU  Anesthesia Type:MAC  Level of Consciousness: awake, alert , oriented and patient cooperative  Airway & Oxygen Therapy: Patient Spontanous Breathing  Post-op Assessment: Report given to PACU RN, Post -op Vital signs reviewed and stable and Patient moving all extremities  Post vital signs: Reviewed and stable  Complications: No apparent anesthesia complications

## 2014-02-18 NOTE — Discharge Instructions (Signed)

## 2014-02-18 NOTE — Op Note (Signed)
Date of Admission: 02/18/2014  Date of Surgery: 02/18/2014   Pre-Op Dx: Cataract Right Eye  Post-Op Dx: Combined Cataract Right  Eye,  Dx Code 366.19  Surgeon: Gemma PayorKerry Maryalyce Sanjuan, M.D.  Assistants: None  Anesthesia: Topical with MAC  Indications: Painless, progressive loss of vision with compromise of daily activities.  Surgery: Cataract Extraction with Intraocular lens Implant Right Eye  Discription: The patient had dilating drops and viscous lidocaine placed into the Right eye in the pre-op holding area. After transfer to the operating room, a time out was performed. The patient was then prepped and draped. Beginning with a 75 degree blade a paracentesis port was made at the surgeon's 2 o'clock position. The anterior chamber was then filled with 1% non-preserved lidocaine. This was followed by filling the anterior chamber with Provisc.  A 2.374mm keratome blade was used to make a clear corneal incision at the temporal limbus.  A bent cystatome needle was used to create a continuous tear capsulotomy. Hydrodissection was performed with balanced salt solution on a Fine canula. The lens nucleus was then removed using the phacoemulsification handpiece. Residual cortex was removed with the I&A handpiece. The anterior chamber and capsular bag were refilled with Provisc. A posterior chamber intraocular lens was placed into the capsular bag with it's injector. The implant was positioned with the Kuglan hook. The Provisc was then removed from the anterior chamber and capsular bag with the I&A handpiece. Stromal hydration of the main incision and paracentesis port was performed with BSS on a Fine canula. The wounds were tested for leak which was negative. The patient tolerated the procedure well. There were no operative complications. The patient was then transferred to the recovery room in stable condition.  Complications: None  Specimen: None  EBL: None  Prosthetic device: Hoya iSert 250, power 20.5 D, SN  [id number].

## 2014-02-23 ENCOUNTER — Encounter (HOSPITAL_COMMUNITY): Payer: Self-pay | Admitting: Ophthalmology

## 2014-02-26 ENCOUNTER — Encounter: Payer: Self-pay | Admitting: *Deleted

## 2014-03-11 ENCOUNTER — Encounter (HOSPITAL_COMMUNITY): Payer: Self-pay | Admitting: Pharmacy Technician

## 2014-03-16 MED ORDER — ONDANSETRON HCL 4 MG/2ML IJ SOLN: 4.0000 mg | Freq: Once | INTRAMUSCULAR | Status: AC | PRN

## 2014-03-16 MED ORDER — FENTANYL CITRATE 0.05 MG/ML IJ SOLN: 25.0000 ug | INTRAMUSCULAR | Status: DC | PRN

## 2014-03-17 ENCOUNTER — Encounter (HOSPITAL_COMMUNITY): Payer: Self-pay

## 2014-03-17 ENCOUNTER — Encounter (HOSPITAL_COMMUNITY)
Admission: RE | Admit: 2014-03-17 | Discharge: 2014-03-17 | Disposition: A | Discharge status: Home / Self Care | Payer: Medicare HMO | Source: Ambulatory Visit | Attending: Ophthalmology | Admitting: Ophthalmology

## 2014-03-17 NOTE — Patient Instructions (Signed)
Your procedure is scheduled on: 03/25/2014  [facility]  Call this number if you have problems the morning of surgery: [phone]   Do not eat food or drink liquids :After Midnight.      Take these medicines the morning of surgery with A SIP OF WATER: losartan, prilosec   Do not wear jewelry, make-up or nail polish.  Do not wear lotions, powders, or perfumes.   Do not shave 48 hours prior to surgery.  Do not bring valuables to the hospital.  Contacts, dentures or bridgework may not be worn into surgery.  Leave suitcase in the car. After surgery it may be brought to your room.  For patients admitted to the hospital, checkout time is 11:00 AM the day of discharge.   Patients discharged the day of surgery will not be allowed to drive home.  :     Please read over the following fact sheets that you were given: Coughing and Deep Breathing, Surgical Site Infection Prevention, Anesthesia Post-op Instructions and Care and Recovery After Surgery    Cataract A cataract is a clouding of the lens of the eye. When a lens becomes cloudy, vision is reduced based on the degree and nature of the clouding. Many cataracts reduce vision to some degree. Some cataracts make people more near-sighted as they develop. Other cataracts increase glare. Cataracts that are ignored and become worse can sometimes look white. The white color can be seen through the pupil. CAUSES   Aging. However, cataracts may occur at any age, even in newborns.   Certain drugs.   Trauma to the eye.   Certain diseases such as diabetes.   Specific eye diseases such as chronic inflammation inside the eye or a sudden attack of a rare form of glaucoma.   Inherited or acquired medical problems.  SYMPTOMS   Gradual, progressive drop in vision in the affected eye.   Severe, rapid visual loss. This most often happens when trauma is the cause.  DIAGNOSIS  To detect a cataract, an eye doctor examines the lens.  Cataracts are best diagnosed with an exam of the eyes with the pupils enlarged (dilated) by drops.  TREATMENT  For an early cataract, vision may improve by using different eyeglasses or stronger lighting. If that does not help your vision, surgery is the only effective treatment. A cataract needs to be surgically removed when vision loss interferes with your everyday activities, such as driving, reading, or watching TV. A cataract may also have to be removed if it prevents examination or treatment of another eye problem. Surgery removes the cloudy lens and usually replaces it with a substitute lens (intraocular lens, IOL).  At a time when both you and your doctor agree, the cataract will be surgically removed. If you have cataracts in both eyes, only one is usually removed at a time. This allows the operated eye to heal and be out of danger from any possible problems after surgery (such as infection or poor wound healing). In rare cases, a cataract may be doing damage to your eye. In these cases, your caregiver may advise surgical removal right away. The vast majority of people who have cataract surgery have better vision afterward. HOME CARE INSTRUCTIONS  If you are not planning surgery, you may be asked to do the following:  Use different eyeglasses.   Use stronger or brighter lighting.   Ask your eye doctor about reducing your medicine dose or changing medicines  if it is thought that a medicine caused your cataract. Changing medicines does not make the cataract go away on its own.   Become familiar with your surroundings. Poor vision can lead to injury. Avoid bumping into things on the affected side. You are at a higher risk for tripping or falling.   Exercise extreme care when driving or operating machinery.   Wear sunglasses if you are sensitive to bright light or experiencing problems with glare.  SEEK IMMEDIATE MEDICAL CARE IF:   You have a worsening or sudden vision loss.   You notice  redness, swelling, or increasing pain in the eye.   You have a fever.  Document Released: 11/05/2005 Document Revised: 10/25/2011 Document Reviewed: 06/29/2011 [facility] Patient Information 2012 [place].PATIENT INSTRUCTIONS POST-ANESTHESIA  IMMEDIATELY FOLLOWING SURGERY:  Do not drive or operate machinery for the first twenty four hours after surgery.  Do not make any important decisions for twenty four hours after surgery or while taking narcotic pain medications or sedatives.  If you develop intractable nausea and vomiting or a severe headache please notify your doctor immediately.  FOLLOW-UP:  Please make an appointment with your surgeon as instructed. You do not need to follow up with anesthesia unless specifically instructed to do so.  WOUND CARE INSTRUCTIONS (if applicable):  Keep a dry clean dressing on the anesthesia/puncture wound site if there is drainage.  Once the wound has quit draining you may leave it open to air.  Generally you should leave the bandage intact for twenty four hours unless there is drainage.  If the epidural site drains for more than 36-48 hours please call the anesthesia department.  QUESTIONS?:  Please feel free to call your physician or the hospital operator if you have any questions, and they will be happy to assist you.

## 2014-03-25 ENCOUNTER — Ambulatory Visit (HOSPITAL_COMMUNITY): Payer: Medicare HMO | Admitting: Anesthesiology

## 2014-03-25 ENCOUNTER — Ambulatory Visit (HOSPITAL_COMMUNITY)
Admission: RE | Admit: 2014-03-25 | Discharge: 2014-03-25 | Disposition: A | Discharge status: Home / Self Care | Payer: Medicare HMO | Source: Ambulatory Visit | Attending: Ophthalmology | Admitting: Ophthalmology

## 2014-03-25 ENCOUNTER — Encounter (HOSPITAL_COMMUNITY): Payer: Self-pay | Admitting: *Deleted

## 2014-03-25 ENCOUNTER — Encounter (HOSPITAL_COMMUNITY)
Admission: RE | Disposition: A | Discharge status: Home / Self Care | Payer: Self-pay | Source: Ambulatory Visit | Attending: Ophthalmology

## 2014-03-25 ENCOUNTER — Encounter (HOSPITAL_COMMUNITY): Payer: Medicare HMO | Admitting: Anesthesiology

## 2014-03-25 DIAGNOSIS — Z7901 Long term (current) use of anticoagulants: Secondary | ICD-10-CM | POA: Insufficient documentation

## 2014-03-25 DIAGNOSIS — H2589 Other age-related cataract: Secondary | ICD-10-CM | POA: Insufficient documentation

## 2014-03-25 DIAGNOSIS — I10 Essential (primary) hypertension: Secondary | ICD-10-CM | POA: Insufficient documentation

## 2014-03-25 DIAGNOSIS — Z79899 Other long term (current) drug therapy: Secondary | ICD-10-CM | POA: Insufficient documentation

## 2014-03-25 DIAGNOSIS — M129 Arthropathy, unspecified: Secondary | ICD-10-CM | POA: Insufficient documentation

## 2014-03-25 HISTORY — PX: CATARACT EXTRACTION W/PHACO: SHX586

## 2014-03-25 LAB — GLUCOSE, CAPILLARY: GLUCOSE-CAPILLARY: 165 mg/dL — AB (ref 70–99)

## 2014-03-25 SURGERY — PHACOEMULSIFICATION, CATARACT, WITH IOL INSERTION
Anesthesia: Monitor Anesthesia Care | Site: Eye | Laterality: Left

## 2014-03-25 MED ORDER — NEOMYCIN-POLYMYXIN-DEXAMETH 3.5-10000-0.1 OP SUSP
OPHTHALMIC | Status: AC
  Filled 2014-03-25: qty 5

## 2014-03-25 MED ORDER — POVIDONE-IODINE 5 % OP SOLN
OPHTHALMIC | Status: DC | PRN
  Administered 2014-03-25: 1 via OPHTHALMIC

## 2014-03-25 MED ORDER — LIDOCAINE HCL (PF) 1 % IJ SOLN
INTRAMUSCULAR | Status: AC
  Filled 2014-03-25: qty 2

## 2014-03-25 MED ORDER — NEOMYCIN-POLYMYXIN-DEXAMETH 3.5-10000-0.1 OP SUSP
OPHTHALMIC | Status: DC | PRN
  Administered 2014-03-25: 2 [drp] via OPHTHALMIC

## 2014-03-25 MED ORDER — FENTANYL CITRATE 0.05 MG/ML IJ SOLN
25.0000 ug | INTRAMUSCULAR | Status: AC
  Administered 2014-03-25 (×2): 25 ug via INTRAVENOUS

## 2014-03-25 MED ORDER — MIDAZOLAM HCL 2 MG/2ML IJ SOLN
1.0000 mg | INTRAMUSCULAR | Status: DC | PRN
  Administered 2014-03-25: 2 mg via INTRAVENOUS

## 2014-03-25 MED ORDER — PHENYLEPHRINE HCL 2.5 % OP SOLN
OPHTHALMIC | Status: AC
  Filled 2014-03-25: qty 15

## 2014-03-25 MED ORDER — LIDOCAINE HCL 3.5 % OP GEL
OPHTHALMIC | Status: AC
  Filled 2014-03-25: qty 1

## 2014-03-25 MED ORDER — PHENYLEPHRINE HCL 2.5 % OP SOLN
1.0000 [drp] | OPHTHALMIC | Status: AC
  Administered 2014-03-25 (×3): 1 [drp] via OPHTHALMIC

## 2014-03-25 MED ORDER — BSS IO SOLN
INTRAOCULAR | Status: DC | PRN
  Administered 2014-03-25: 15 mL via INTRAOCULAR

## 2014-03-25 MED ORDER — TETRACAINE HCL 0.5 % OP SOLN
OPHTHALMIC | Status: AC
  Filled 2014-03-25: qty 2

## 2014-03-25 MED ORDER — PROVISC 10 MG/ML IO SOLN
INTRAOCULAR | Status: DC | PRN
  Administered 2014-03-25: 0.85 mL via INTRAOCULAR

## 2014-03-25 MED ORDER — EPINEPHRINE HCL 1 MG/ML IJ SOLN
INTRAOCULAR | Status: DC | PRN
  Administered 2014-03-25: 10:00:00

## 2014-03-25 MED ORDER — LACTATED RINGERS IV SOLN
INTRAVENOUS | Status: DC
  Administered 2014-03-25: 1000 mL via INTRAVENOUS

## 2014-03-25 MED ORDER — FENTANYL CITRATE 0.05 MG/ML IJ SOLN
INTRAMUSCULAR | Status: AC
  Filled 2014-03-25: qty 2

## 2014-03-25 MED ORDER — CYCLOPENTOLATE-PHENYLEPHRINE 0.2-1 % OP SOLN
1.0000 [drp] | OPHTHALMIC | Status: AC
  Administered 2014-03-25 (×3): 1 [drp] via OPHTHALMIC

## 2014-03-25 MED ORDER — CYCLOPENTOLATE-PHENYLEPHRINE OP SOLN OPTIME - NO CHARGE
OPHTHALMIC | Status: AC
  Filled 2014-03-25: qty 2

## 2014-03-25 MED ORDER — TETRACAINE HCL 0.5 % OP SOLN
1.0000 [drp] | OPHTHALMIC | Status: AC
  Administered 2014-03-25 (×3): 1 [drp] via OPHTHALMIC

## 2014-03-25 MED ORDER — MIDAZOLAM HCL 2 MG/2ML IJ SOLN
INTRAMUSCULAR | Status: AC
  Filled 2014-03-25: qty 2

## 2014-03-25 MED ORDER — LIDOCAINE HCL 3.5 % OP GEL
1.0000 "application " | Freq: Once | OPHTHALMIC | Status: AC
  Administered 2014-03-25: 1 via OPHTHALMIC

## 2014-03-25 MED ORDER — LIDOCAINE HCL (PF) 1 % IJ SOLN
INTRAMUSCULAR | Status: DC | PRN
  Administered 2014-03-25: .7 mL

## 2014-03-25 MED ORDER — EPINEPHRINE HCL 1 MG/ML IJ SOLN
INTRAMUSCULAR | Status: AC
  Filled 2014-03-25: qty 1

## 2014-03-25 SURGICAL SUPPLY — 32 items
CAPSULAR TENSION RING-AMO (OPHTHALMIC RELATED) IMPLANT
CLOTH BEACON ORANGE TIMEOUT ST (SAFETY) ×1 IMPLANT
EYE SHIELD UNIVERSAL CLEAR (GAUZE/BANDAGES/DRESSINGS) ×1 IMPLANT
GLOVE BIO SURGEON STRL SZ 6.5 (GLOVE) IMPLANT
GLOVE BIOGEL PI IND STRL 6.5 (GLOVE) IMPLANT
GLOVE BIOGEL PI IND STRL 7.0 (GLOVE) IMPLANT
GLOVE BIOGEL PI IND STRL 7.5 (GLOVE) IMPLANT
GLOVE BIOGEL PI INDICATOR 6.5 (GLOVE)
GLOVE BIOGEL PI INDICATOR 7.0 (GLOVE) ×2
GLOVE BIOGEL PI INDICATOR 7.5 (GLOVE)
GLOVE ECLIPSE 6.5 STRL STRAW (GLOVE) IMPLANT
GLOVE ECLIPSE 7.0 STRL STRAW (GLOVE) IMPLANT
GLOVE ECLIPSE 7.5 STRL STRAW (GLOVE) IMPLANT
GLOVE EXAM NITRILE LRG STRL (GLOVE) IMPLANT
GLOVE EXAM NITRILE MD LF STRL (GLOVE) IMPLANT
GLOVE SKINSENSE NS SZ6.5 (GLOVE)
GLOVE SKINSENSE NS SZ7.0 (GLOVE)
GLOVE SKINSENSE STRL SZ6.5 (GLOVE) IMPLANT
GLOVE SKINSENSE STRL SZ7.0 (GLOVE) IMPLANT
KIT VITRECTOMY (OPHTHALMIC RELATED) IMPLANT
PAD ARMBOARD 7.5X6 YLW CONV (MISCELLANEOUS) ×1 IMPLANT
PROC W NO LENS (INTRAOCULAR LENS)
PROC W SPEC LENS (INTRAOCULAR LENS)
PROCESS W NO LENS (INTRAOCULAR LENS) IMPLANT
PROCESS W SPEC LENS (INTRAOCULAR LENS) IMPLANT
RING MALYGIN (MISCELLANEOUS) IMPLANT
SIGHTPATH CAT PROC W REG LENS (Ophthalmic Related) ×2 IMPLANT
SYR TB 1ML LL NO SAFETY (SYRINGE) ×1 IMPLANT
TAPE SURG TRANSPORE 1 IN (GAUZE/BANDAGES/DRESSINGS) IMPLANT
TAPE SURGICAL TRANSPORE 1 IN (GAUZE/BANDAGES/DRESSINGS) ×1
VISCOELASTIC ADDITIONAL (OPHTHALMIC RELATED) IMPLANT
WATER STERILE IRR 250ML POUR (IV SOLUTION) ×1 IMPLANT

## 2014-03-25 NOTE — H&P (Signed)
I have reviewed the H&P, the patient was re-examined, and I have identified no interval changes in medical condition and plan of care since the history and physical of record  

## 2014-03-25 NOTE — Transfer of Care (Signed)
Immediate Anesthesia Transfer of Care Note  Patient: [name]  Procedure(s) Performed: Procedure(s) with comments: CATARACT EXTRACTION PHACO AND INTRAOCULAR LENS PLACEMENT (IOC) (Left) - CDE 15.93  Patient Location: Short Stay  Anesthesia Type:MAC  Level of Consciousness: awake  Airway & Oxygen Therapy: Patient Spontanous Breathing  Post-op Assessment: Report given to PACU RN  Post vital signs: Reviewed  Complications: No apparent anesthesia complications

## 2014-03-25 NOTE — Discharge Instructions (Signed)

## 2014-03-25 NOTE — Op Note (Signed)
Date of Admission: 03/25/2014  Date of Surgery: 03/25/2014   Pre-Op Dx: Cataract Left Eye  Post-Op Dx: Combined Cataract Left  Eye,  Dx Code 366.19  Surgeon: Gemma PayorKerry Kanden Carey, M.D.  Assistants: None  Anesthesia: Topical with MAC  Indications: Painless, progressive loss of vision with compromise of daily activities.  Surgery: Cataract Extraction with Intraocular lens Implant Left Eye  Discription: The patient had dilating drops and viscous lidocaine placed into the Left eye in the pre-op holding area. After transfer to the operating room, a time out was performed. The patient was then prepped and draped. Beginning with a 75 degree blade a paracentesis port was made at the surgeon's 2 o'clock position. The anterior chamber was then filled with 1% non-preserved lidocaine. This was followed by filling the anterior chamber with Provisc.  A 2.364mm keratome blade was used to make a clear corneal incision at the temporal limbus.  A bent cystatome needle was used to create a continuous tear capsulotomy. Hydrodissection was performed with balanced salt solution on a Fine canula. The lens nucleus was then removed using the phacoemulsification handpiece. Residual cortex was removed with the I&A handpiece. The anterior chamber and capsular bag were refilled with Provisc. A posterior chamber intraocular lens was placed into the capsular bag with it's injector. The implant was positioned with the Kuglan hook. The Provisc was then removed from the anterior chamber and capsular bag with the I&A handpiece. Stromal hydration of the main incision and paracentesis port was performed with BSS on a Fine canula. The wounds were tested for leak which was negative. The patient tolerated the procedure well. There were no operative complications. The patient was then transferred to the recovery room in stable condition.  Complications: None  Specimen: None  EBL: None  Prosthetic device: Hoya iSert 250, power 20.0 D, SN  [id number].

## 2014-03-25 NOTE — Anesthesia Postprocedure Evaluation (Signed)
  Anesthesia Post-op Note  Patient: [name]  Procedure(s) Performed: Procedure(s) with comments: CATARACT EXTRACTION PHACO AND INTRAOCULAR LENS PLACEMENT (IOC) (Left) - CDE 15.93  Patient Location: Short Stay  Anesthesia Type:MAC  Level of Consciousness: awake, alert  and oriented  Airway and Oxygen Therapy: Patient Spontanous Breathing  Post-op Pain: none  Post-op Assessment: Post-op Vital signs reviewed, Patient's Cardiovascular Status Stable, Respiratory Function Stable, Patent Airway and No signs of Nausea or vomiting  Post-op Vital Signs: Reviewed and stable  Last Vitals:  Filed Vitals:   03/25/14 0859  BP: 131/79  Pulse: 67  Temp: 36.4 C  Resp: 17    Complications: No apparent anesthesia complications

## 2014-03-25 NOTE — Anesthesia Preprocedure Evaluation (Signed)
Anesthesia Evaluation  Patient identified by MRN, date of birth, ID band Patient awake    Reviewed: Allergy & Precautions, H&P , NPO status , Patient's Chart, lab work & pertinent test results, reviewed documented beta blocker date and time   Airway Mallampati: II TM Distance: >3 FB     Dental  (+) Edentulous Upper, Partial Lower   Pulmonary sleep apnea and Continuous Positive Airway Pressure Ventilation , PE breath sounds clear to auscultation        Cardiovascular hypertension, Pt. on home beta blockers and Pt. on medications + CAD and + Peripheral Vascular Disease (carotid) + pacemaker Rhythm:Regular Rate:Normal  pulm htn    Neuro/Psych CVA    GI/Hepatic   Endo/Other  diabetes, Type 2  Renal/GU      Musculoskeletal   Abdominal   Peds  Hematology   Anesthesia Other Findings   Reproductive/Obstetrics                           Anesthesia Physical Anesthesia Plan  ASA: IV  Anesthesia Plan: MAC   Post-op Pain Management:    Induction: Intravenous  Airway Management Planned: Nasal Cannula  Additional Equipment:   Intra-op Plan:   Post-operative Plan:   Informed Consent: I have reviewed the patients History and Physical, chart, labs and discussed the procedure including the risks, benefits and alternatives for the proposed anesthesia with the patient or authorized representative who has indicated his/her understanding and acceptance.     Plan Discussed with:   Anesthesia Plan Comments:         Anesthesia Quick Evaluation

## 2014-03-26 ENCOUNTER — Encounter (HOSPITAL_COMMUNITY): Payer: Self-pay | Admitting: Ophthalmology

## 2014-04-05 ENCOUNTER — Ambulatory Visit (INDEPENDENT_AMBULATORY_CARE_PROVIDER_SITE_OTHER): Payer: Medicare HMO | Admitting: *Deleted

## 2014-04-05 ENCOUNTER — Telehealth: Payer: Self-pay | Admitting: Cardiology

## 2014-04-05 ENCOUNTER — Encounter: Payer: Self-pay | Admitting: Internal Medicine

## 2014-04-05 DIAGNOSIS — I498 Other specified cardiac arrhythmias: Secondary | ICD-10-CM

## 2014-04-05 NOTE — Telephone Encounter (Signed)
Spoke with pt and reminded pt of remote transmission that is due today. Pt verbalized understanding.   

## 2014-04-05 NOTE — Progress Notes (Signed)
Remote pacemaker transmission.   

## 2014-04-08 LAB — MDC_IDC_ENUM_SESS_TYPE_REMOTE
Battery Impedance: 2182 Ohm
Battery Remaining Longevity: 26 mo
Brady Statistic AP VP Percent: 0 %
Brady Statistic AP VS Percent: 9 %
Date Time Interrogation Session: 20150518170349
Lead Channel Setting Sensing Sensitivity: 2 mV
MDC IDC MSMT BATTERY VOLTAGE: 2.7 V
MDC IDC MSMT LEADCHNL RA IMPEDANCE VALUE: 393 Ohm
MDC IDC MSMT LEADCHNL RV IMPEDANCE VALUE: 453 Ohm
MDC IDC SET LEADCHNL RA PACING AMPLITUDE: 2 V
MDC IDC SET LEADCHNL RV PACING AMPLITUDE: 2.75 V
MDC IDC SET LEADCHNL RV PACING PULSEWIDTH: 0.4 ms
MDC IDC STAT BRADY AS VP PERCENT: 0 %
MDC IDC STAT BRADY AS VS PERCENT: 91 %

## 2014-04-13 ENCOUNTER — Encounter: Payer: Self-pay | Admitting: Cardiology

## 2014-05-31 ENCOUNTER — Encounter: Payer: Self-pay | Admitting: Cardiology

## 2014-05-31 ENCOUNTER — Ambulatory Visit (INDEPENDENT_AMBULATORY_CARE_PROVIDER_SITE_OTHER): Payer: Medicare HMO | Admitting: Cardiology

## 2014-05-31 VITALS — BP 102/64 | HR 79 | Ht 73.0 in | Wt 299.1 lb

## 2014-05-31 DIAGNOSIS — Z950 Presence of cardiac pacemaker: Secondary | ICD-10-CM

## 2014-05-31 DIAGNOSIS — I2699 Other pulmonary embolism without acute cor pulmonale: Secondary | ICD-10-CM

## 2014-05-31 DIAGNOSIS — I10 Essential (primary) hypertension: Secondary | ICD-10-CM

## 2014-05-31 DIAGNOSIS — I2510 Atherosclerotic heart disease of native coronary artery without angina pectoris: Secondary | ICD-10-CM

## 2014-05-31 MED ORDER — LOSARTAN POTASSIUM 25 MG PO TABS: 25.0000 mg | ORAL_TABLET | Freq: Every day | ORAL | Status: DC

## 2014-05-31 NOTE — Assessment & Plan Note (Signed)
Coumadin is being followed and managed by Dr. Olena LeatherwoodHasanaj. He has pulmonary hypertension, RV-RA gradient down to 38 mm mercury as of January echocardiogram.

## 2014-05-31 NOTE — Patient Instructions (Signed)
Your physician recommends that you schedule a follow-up appointment in: 3 months.  Your physician has recommended you make the following change in your medication:  Decrease your losartan (cozaar) to 25 mg daily. Your new prescription was sent to your pharmacy. Continue all other medications the same. Please let our office know whether or not your symptoms improve.

## 2014-05-31 NOTE — Assessment & Plan Note (Signed)
Followed by Dr. Allred. 

## 2014-05-31 NOTE — Assessment & Plan Note (Signed)
He is not reporting any angina symptoms. History of DES to the PL in 2006 with mild to moderate residual nonobstructive disease as of 2010. Continue medical therapy and observation.

## 2014-05-31 NOTE — Assessment & Plan Note (Signed)
Patient reporting fatigue as outlined above and relatively low blood pressures. I asked him to reduce Cozaar to 25 mg daily. If his symptoms do not improve, I asked him to call back to let us know.

## 2014-05-31 NOTE — Progress Notes (Signed)
Clinical Summary [name] is a medically complex 79 y.o.male last seen in March. Interval device check noted. He is here with his wife today. He does not endorse any angina symptoms, but states that he has been fatigued over the last few months, particularly when he tries to get outdoors and heat. Also noticed that his blood pressure has been somewhat low.  Lab work from March of this year showed potassium 4.1, BUN 21, creatinine 1.3, hemoglobin 12.3, platelets 159.  Complex medical history includes subdural hematoma (on Coumadin), recurring pulmonary emboli despite IVC filter (off anticoagulation at that time) and requiring thrombolytic therapy, [facility]. Coumadin is followed by Dr. Olena LeatherwoodHasanaj.  Echocardiogram from January 2015 demonstrated mild LVH with LVEF 60-65%, basal inferior hypokinesis, grade 1 diastolic dysfunction, mild left atrial enlargement, MAC and mild mitral regurgitation, device wire in the right heart, mild tricuspid rotation, RV-RA gradient 38 mm mercury - significantly improved in comparison to previous study report March 2014.   Allergies  Allergen Reactions  . Nitroglycerin     REACTION: BP decrease    Current Outpatient Prescriptions  Medication Sig Dispense Refill  . allopurinol (ZYLOPRIM) 300 MG tablet Take 300 mg by mouth daily.        Marland Kitchen. aspirin 81 MG tablet Take 81 mg by mouth daily.        Marland Kitchen. atorvastatin (LIPITOR) 40 MG tablet Take 40 mg by mouth daily.      . carvedilol (COREG) 6.25 MG tablet Take 1.5 tablets (9.375 mg total) by mouth 2 (two) times daily with a meal.  270 tablet  3  . colchicine 0.6 MG tablet Take 0.6 mg by mouth as needed.      . fenofibrate (TRICOR) 145 MG tablet Take 145 mg by mouth daily.        . ferrous sulfate 325 (65 FE) MG tablet Take 325 mg by mouth 3 (three) times daily with meals.      . furosemide (LASIX) 20 MG tablet Take 20 mg by mouth daily.       Marland Kitchen. glimepiride (AMARYL) 2 MG tablet  Take 2 mg by mouth daily before breakfast.      . levETIRAcetam (KEPPRA) 750 MG tablet Take 750 mg by mouth daily.      . Omega-3 Fatty Acids (FISH OIL) 1000 MG CAPS Take 1 capsule by mouth 3 (three) times daily.      Marland Kitchen. omeprazole (PRILOSEC) 40 MG capsule Take 40 mg by mouth daily.        . sitaGLIPtin (JANUVIA) 100 MG tablet Take 100 mg by mouth daily.      Marland Kitchen. warfarin (COUMADIN) 5 MG tablet Take 5 mg by mouth as directed. Managed by Dr. Bartholomew CrewsHasanaj's office      . losartan (COZAAR) 25 MG tablet Take 1 tablet (25 mg total) by mouth daily.  90 tablet  3   No current facility-administered medications for this visit.    Past Medical History  Diagnosis Date  . Type 2 diabetes mellitus   . Essential hypertension, benign   . Carotid sinus syndrome     Status post pacemaker  . Mixed hyperlipidemia   . Obstructive sleep apnea     CPAP  . Pulmonary hypertension     Severe, RVSP 85-90 mmHg March 2014   . Coronary atherosclerosis of native coronary artery     DES to PL 2006, mild to moderate residual disease 2010   . Subdural hematoma  January 2014, occurred on Coumadin  . Pulmonary emboli     Diagnosed 2012 and 2014, IVC filter March 2014, recurrent March 2014 - thromboembolic therapy at [facility] and placed back on anticoagulation  . Pacemaker 1986    Medtronic  . Stroke 11/2012    Past Surgical History  Procedure Laterality Date  . Pacemaker implanted   07/16/2005    Medtronic Kappa 900 DR  . Filtering procedure  11/2012    placed due to blood clots from the lungs  . Arm surgery Right   . Cholecystectomy  2011  . Cataract extraction w/phaco Right 02/18/2014    Procedure: CATARACT EXTRACTION PHACO AND INTRAOCULAR LENS PLACEMENT (IOC);  Surgeon: Gemma Payor, MD;  Location: [facility];  Service: Ophthalmology;  Laterality: Right;  CDE: 11.83  . Cataract extraction w/phaco Left 03/25/2014    Procedure: CATARACT EXTRACTION PHACO AND INTRAOCULAR LENS PLACEMENT (IOC);  Surgeon: Gemma Payor, MD;  Location:  [facility];  Service: Ophthalmology;  Laterality: Left;  CDE 15.93    Social History Mr. Longton reports that he has never smoked. He has never used smokeless tobacco. Mr. Pilley reports that he does not drink alcohol.  Review of Systems Other systems reviewed and negative except as outlined.  Physical Examination Filed Vitals:   05/31/14 1100  BP: 102/64  Pulse: 79   Filed Weights   05/31/14 1100  Weight: 299 lb 1.9 oz (135.68 kg)    Morbidly obese male, no distress at present.  HEENT: Conjunctiva and lids normal, oropharynx clear.  Neck: Supple, increased girth with difficult to assess JVP, no carotid bruits, no thyromegaly.  Lungs: Clear to auscultation, nonlabored breathing at rest.  Cardiac: Regular rate and rhythm, no S3, soft apical systolic murmur, no pericardial rub.  Abdomen: Soft, nontender, protuberant, bowel sounds present.  Extremities: Chronic appearing edema, distal pulses 1-2+.  Skin: Warm and dry.  Musculoskeletal: No kyphosis.  Neuropsychiatric: Alert and oriented x3, affect grossly appropriate.   Problem List and Plan   Coronary atherosclerosis of native coronary artery He is not reporting any angina symptoms. History of DES to the PL in 2006 with mild to moderate residual nonobstructive disease as of 2010. Continue medical therapy and observation.  Essential hypertension, benign Patient reporting fatigue as outlined above and relatively low blood pressures. I asked him to reduce Cozaar to 25 mg daily. If his symptoms do not improve, I asked him to call back to let us know.  Recurrent pulmonary emboli Coumadin is being followed and managed by Dr. Olena Leatherwood. He has pulmonary hypertension, RV-RA gradient down to 38 mm mercury as of January echocardiogram.  Pacemaker-Medtronic Followed by Dr. Johney Frame.    Jonelle Sidle, M.D., F.A.C.C.

## 2014-07-07 ENCOUNTER — Ambulatory Visit (INDEPENDENT_AMBULATORY_CARE_PROVIDER_SITE_OTHER): Payer: Medicare HMO | Admitting: *Deleted

## 2014-07-07 ENCOUNTER — Encounter: Payer: Self-pay | Admitting: Internal Medicine

## 2014-07-07 DIAGNOSIS — I498 Other specified cardiac arrhythmias: Secondary | ICD-10-CM

## 2014-07-07 NOTE — Progress Notes (Signed)
Remote pacemaker transmission.   

## 2014-07-15 LAB — MDC_IDC_ENUM_SESS_TYPE_REMOTE
Battery Impedance: 2559 Ohm
Battery Remaining Longevity: 21 mo
Battery Voltage: 2.71 V
Brady Statistic AP VS Percent: 9 %
Lead Channel Pacing Threshold Amplitude: 1.25 V
Lead Channel Pacing Threshold Pulse Width: 0.4 ms
Lead Channel Sensing Intrinsic Amplitude: 2.8 mV
Lead Channel Setting Pacing Amplitude: 2 V
Lead Channel Setting Pacing Amplitude: 2.5 V
Lead Channel Setting Pacing Pulse Width: 0.4 ms
Lead Channel Setting Sensing Sensitivity: 2.8 mV
MDC IDC MSMT LEADCHNL RA IMPEDANCE VALUE: 421 Ohm
MDC IDC MSMT LEADCHNL RV IMPEDANCE VALUE: 516 Ohm
MDC IDC MSMT LEADCHNL RV SENSING INTR AMPL: 16 mV
MDC IDC SESS DTM: 20150819110414
MDC IDC STAT BRADY AP VP PERCENT: 0 %
MDC IDC STAT BRADY AS VP PERCENT: 0 %
MDC IDC STAT BRADY AS VS PERCENT: 91 %

## 2014-07-20 ENCOUNTER — Encounter: Payer: Self-pay | Admitting: Cardiology

## 2014-08-31 ENCOUNTER — Encounter: Payer: Self-pay | Admitting: Cardiology

## 2014-08-31 ENCOUNTER — Ambulatory Visit (INDEPENDENT_AMBULATORY_CARE_PROVIDER_SITE_OTHER): Payer: Medicare HMO | Admitting: Cardiology

## 2014-08-31 VITALS — BP 144/86 | HR 66 | Ht 72.0 in | Wt 296.1 lb

## 2014-08-31 DIAGNOSIS — I2699 Other pulmonary embolism without acute cor pulmonale: Secondary | ICD-10-CM

## 2014-08-31 DIAGNOSIS — G9001 Carotid sinus syncope: Secondary | ICD-10-CM

## 2014-08-31 MED ORDER — CARVEDILOL 6.25 MG PO TABS: 6.2500 mg | ORAL_TABLET | Freq: Two times a day (BID) | ORAL | Status: DC

## 2014-08-31 NOTE — Assessment & Plan Note (Signed)
Status post IVC filter. Coumadin is being followed and managed by Dr. Olena LeatherwoodHasanaj. He has pulmonary hypertension, RV-RA gradient down to 38 mm mercury as of January echocardiogram.

## 2014-08-31 NOTE — Patient Instructions (Signed)
Your physician recommends that you schedule a follow-up appointment in: 3 months. You will receive a reminder letter in the mail in about 1-2 months reminding you to call and schedule your appointment. If you don't receive this letter, please contact our office. Your physician has recommended you make the following change in your medication:  Decrease your carvedilol to 6.25 mg twice daily. Continue all other medications the same.

## 2014-08-31 NOTE — Assessment & Plan Note (Signed)
Medtronic pacemaker. Followed by Dr. Johney FrameAllred.

## 2014-08-31 NOTE — Progress Notes (Signed)
Clinical Summary [name] is a medically condylar 79 y.o.male last seen in July. He continues to follow in the device clinic - 5 mode switches, longest was only 19 seconds.  We cut back Cozaar at the last visit. Still states he just feels weak somewhat fatigued when he tries to get around. Some of this is orthopedic in nature. Also discussed perhaps cutting his Coreg back just a bit.  Complex medical history includes subdural hematoma (on Coumadin), recurring pulmonary emboli despite IVC filter (off anticoagulation at that time) and requiring thrombolytic therapy, [facility]. Coumadin is followed by Dr. Olena LeatherwoodHasanaj.  Echocardiogram from January 2015 demonstrated mild LVH with LVEF 60-65%, basal inferior hypokinesis, grade 1 diastolic dysfunction, mild left atrial enlargement, MAC and mild mitral regurgitation, device wire in the right heart, mild tricuspid regurgitation, RV-RA gradient 38 mm mercury - significantly improved in comparison to previous study report March 2014.   Allergies  Allergen Reactions  . Nitroglycerin     REACTION: BP decrease    Current Outpatient Prescriptions  Medication Sig Dispense Refill  . allopurinol (ZYLOPRIM) 300 MG tablet Take 300 mg by mouth daily.        Marland Kitchen. aspirin 81 MG tablet Take 81 mg by mouth daily.        Marland Kitchen. atorvastatin (LIPITOR) 40 MG tablet Take 40 mg by mouth daily.      . carvedilol (COREG) 6.25 MG tablet Take 1 tablet (6.25 mg total) by mouth 2 (two) times daily with a meal.  180 tablet  3  . colchicine 0.6 MG tablet Take 0.6 mg by mouth as needed.      . fenofibrate (TRICOR) 145 MG tablet Take 145 mg by mouth daily.        . ferrous sulfate 325 (65 FE) MG tablet Take 325 mg by mouth 3 (three) times daily with meals.      . furosemide (LASIX) 20 MG tablet Take 20 mg by mouth daily.       Marland Kitchen. glimepiride (AMARYL) 2 MG tablet Take 2 mg by mouth daily before breakfast.      . levETIRAcetam (KEPPRA) 750 MG  tablet Take 750 mg by mouth daily.      Marland Kitchen. losartan (COZAAR) 25 MG tablet Take 1 tablet (25 mg total) by mouth daily.  90 tablet  3  . Omega-3 Fatty Acids (FISH OIL) 1000 MG CAPS Take 1 capsule by mouth 3 (three) times daily.      Marland Kitchen. omeprazole (PRILOSEC) 40 MG capsule Take 40 mg by mouth daily.        . sitaGLIPtin (JANUVIA) 100 MG tablet Take 100 mg by mouth daily.      Marland Kitchen. warfarin (COUMADIN) 5 MG tablet Take 5 mg by mouth as directed. Managed by Dr. Bartholomew CrewsHasanaj's office       No current facility-administered medications for this visit.    Past Medical History  Diagnosis Date  . Type 2 diabetes mellitus   . Essential hypertension, benign   . Carotid sinus syndrome     Status post pacemaker  . Mixed hyperlipidemia   . Obstructive sleep apnea     CPAP  . Pulmonary hypertension     Severe, RVSP 85-90 mmHg March 2014   . Coronary atherosclerosis of native coronary artery     DES to PL 2006, mild to moderate residual disease 2010   . Subdural hematoma     January 2014, occurred on Coumadin  .  Pulmonary emboli     Diagnosed 2012 and 2014, IVC filter March 2014, recurrent March 2014 - [facility] and placed back on anticoagulation  . Pacemaker 1986    Medtronic  . Stroke 11/2012    Past Surgical History  Procedure Laterality Date  . Pacemaker implanted   07/16/2005    Medtronic Kappa 900 DR  . Filtering procedure  11/2012    placed due to blood clots from the lungs  . Arm surgery Right   . Cholecystectomy  2011  . Cataract extraction w/phaco Right 02/18/2014    Procedure: CATARACT EXTRACTION PHACO AND INTRAOCULAR LENS PLACEMENT (IOC);  Surgeon: Gemma PayorKerry Hunt, MD;  Location: [facility];  Service: Ophthalmology;  Laterality: Right;  CDE: 11.83  . Cataract extraction w/phaco Left 03/25/2014    Procedure: CATARACT EXTRACTION PHACO AND INTRAOCULAR LENS PLACEMENT (IOC);  Surgeon: Gemma PayorKerry Hunt, MD;  Location: [facility];  Service: Ophthalmology;  Laterality: Left;  CDE 15.93    Social  History [name] reports that he has never smoked. He has never used smokeless tobacco. [name] reports that he does not drink alcohol.  Review of Systems Chronic knee pain. No chest pain or palpitations. No hospitalizations in the interim. Other systems reviewed and negative.  Physical Examination Filed Vitals:   08/31/14 0847  BP: 144/86  Pulse: 66   Filed Weights   08/31/14 0847  Weight: 296 lb 1.9 oz (134.319 kg)    Morbidly obese male, appears comfortable at rest. HEENT: Conjunctiva and lids normal, oropharynx clear.  Neck: Supple, increased girth with difficult to assess JVP, no carotid bruits, no thyromegaly.  Lungs: Clear to auscultation, nonlabored breathing at rest.  Cardiac: Regular rate and rhythm, no S3, soft apical systolic murmur, no pericardial rub.  Abdomen: Soft, nontender, protuberant, bowel sounds present.  Extremities: Chronic appearing edema, distal pulses 1-2+.  Skin: Warm and dry.  Musculoskeletal: No kyphosis.  Neuropsychiatric: Alert and oriented x3, affect grossly appropriate.   Problem List and Plan   Coronary atherosclerosis of native coronary artery Symptomatically stable on medical therapy. Will reduce Coreg to 6.25 mg twice daily, continue observation.  Recurrent pulmonary emboli Status post IVC filter. Coumadin is being followed and managed by Dr. Olena LeatherwoodHasanaj. He has pulmonary hypertension, RV-RA gradient down to 38 mm mercury as of January echocardiogram.  Carotid sinus syndrome Medtronic pacemaker. Followed by Dr. Johney FrameAllred.    Jermaine SidleSamuel G. Silveria Botz, M.D., F.A.C.C.

## 2014-08-31 NOTE — Assessment & Plan Note (Signed)
Symptomatically stable on medical therapy. Will reduce Coreg to 6.25 mg twice daily, continue observation.

## 2014-09-11 ENCOUNTER — Ambulatory Visit (INDEPENDENT_AMBULATORY_CARE_PROVIDER_SITE_OTHER): Payer: Medicare HMO | Admitting: *Deleted

## 2014-09-11 DIAGNOSIS — Z23 Encounter for immunization: Secondary | ICD-10-CM

## 2014-10-13 ENCOUNTER — Encounter: Payer: Self-pay | Admitting: *Deleted

## 2014-11-16 ENCOUNTER — Encounter: Payer: Self-pay | Admitting: *Deleted

## 2014-12-17 ENCOUNTER — Encounter: Payer: Self-pay | Admitting: *Deleted

## 2015-01-13 ENCOUNTER — Encounter: Payer: Self-pay | Admitting: *Deleted

## 2015-07-22 ENCOUNTER — Encounter: Payer: Self-pay | Admitting: Internal Medicine

## 2015-07-22 ENCOUNTER — Ambulatory Visit (INDEPENDENT_AMBULATORY_CARE_PROVIDER_SITE_OTHER): Payer: Self-pay | Admitting: Internal Medicine

## 2015-07-22 ENCOUNTER — Encounter: Payer: Self-pay | Admitting: *Deleted

## 2015-07-22 VITALS — BP 118/60 | HR 76 | Ht 73.0 in | Wt 289.0 lb

## 2015-07-22 DIAGNOSIS — Z950 Presence of cardiac pacemaker: Secondary | ICD-10-CM | POA: Diagnosis not present

## 2015-07-22 DIAGNOSIS — I495 Sick sinus syndrome: Secondary | ICD-10-CM

## 2015-07-22 DIAGNOSIS — G9001 Carotid sinus syncope: Secondary | ICD-10-CM

## 2015-07-22 DIAGNOSIS — Z01810 Encounter for preprocedural cardiovascular examination: Secondary | ICD-10-CM

## 2015-07-22 NOTE — Patient Instructions (Addendum)
Your physician recommends that you continue on your current medications as directed. Please refer to the Current Medication list given to you today. You are scheduled to have your pacemaker changed out on Tuesday, July 26, 2015 :00 pm. Please refer to the instruction sheet that has been given to you today for details.

## 2015-07-22 NOTE — Progress Notes (Signed)
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  He has not been seen by me in 2 years.  He has not been compliant with device follow-up.  He now presents at [facility].  He reports fatigue and decreased exercise tolerance which he attributes to this.   He is limited by DJD.  He struggles with his weight.  Per my office notes previously he has some degree of chronic fatigue and debilitation.   He appears to have been chronically SOB.  Today, he denies symptoms of palpitations, chest pain,  lower extremity edema,  presyncope, or syncope.  The patient is otherwise without complaint today.   Past Medical History  Diagnosis Date  . Type 2 diabetes mellitus   . Essential hypertension, benign   . Carotid sinus syndrome     Status post pacemaker  . Mixed hyperlipidemia   . Obstructive sleep apnea     CPAP  . Pulmonary hypertension     Severe, RVSP 85-90 mmHg March 2014   . Coronary atherosclerosis of native coronary artery     DES to PL 2006, mild to moderate residual disease 2010   . Subdural hematoma     January 2014, occurred on Coumadin  . Pulmonary emboli     Diagnosed 2012 and 2014, IVC filter March 2014, recurrent March 2014 - throm[facility]embolic therapy at [facility] and placed back on anticoagulation  . Pacemaker 1986    Medtronic  . Stroke 11/2012   Past Surgical History  Procedure Laterality Date  . Pacemaker implanted   07/16/2005    Medtronic Kappa 900 DR  . Filtering procedure  11/2012    placed due to blood clots from the lungs  . Arm surgery Right   . Cholecystectomy  2011  . Cataract extraction w/phaco Right 02/18/2014    Procedure: CATARACT EXTRACTION PHACO AND INTRAOCULAR LENS PLACEMENT (IOC);  Surgeon: Gemma Payor, MD;  Location: [facility];  Service: Ophthalmology;  Laterality: Right;  CDE: 11.83  . Cataract extraction w/phaco Left 03/25/2014    Procedure: CATARACT EXTRACTION PHACO AND INTRAOCULAR  LENS PLACEMENT (IOC);  Surgeon: Gemma Payor, MD;  Location: [facility];  Service: Ophthalmology;  Laterality: Left;  CDE 15.93    Current Outpatient Prescriptions  Medication Sig Dispense Refill  . allopurinol (ZYLOPRIM) 300 MG tablet Take 300 mg by mouth daily.      Marland Kitchen aspirin 81 MG tablet Take 81 mg by mouth daily.      Marland Kitchen atorvastatin (LIPITOR) 40 MG tablet Take 40 mg by mouth daily.    . carvedilol (COREG) 6.25 MG tablet Take 1 tablet (6.25 mg total) by mouth 2 (two) times daily with a meal. 180 tablet 3  . colchicine 0.6 MG tablet Take 0.6 mg by mouth as needed.    . fenofibrate (TRICOR) 145 MG tablet Take 145 mg by mouth daily.      . ferrous sulfate 325 (65 FE) MG tablet Take 325 mg by mouth 3 (three) times daily with meals.    . furosemide (LASIX) 20 MG tablet Take 20 mg by mouth daily.     Marland Kitchen glimepiride (AMARYL) 2 MG tablet Take 2 mg by mouth daily before breakfast.    . levETIRAcetam (KEPPRA) 750 MG tablet Take 750 mg by mouth daily.    Marland Kitchen losartan (COZAAR) 25 MG tablet Take 1 tablet (25 mg total) by mouth daily. 90 tablet 3  . Omega-3  Fatty Acids (FISH OIL) 1000 MG CAPS Take 1 capsule by mouth 3 (three) times daily.    Marland Kitchen omeprazole (PRILOSEC) 40 MG capsule Take 40 mg by mouth daily.      . sitaGLIPtin (JANUVIA) 100 MG tablet Take 100 mg by mouth daily.    Marland Kitchen warfarin (COUMADIN) 5 MG tablet Take 5 mg by mouth as directed. Managed by Dr. Bartholomew Crews office     No current facility-administered medications for this visit.    Physical Exam: Filed Vitals:   07/22/15 1039  BP: 118/60  Pulse: 76  Height: 6\' 1"  (1.854 m)  Weight: 131.09 kg (289 lb)  SpO2: 94%    GEN- The patient is overweight appearing, alert and oriented x 3 today.   Head- normocephalic, atraumatic Eyes-  Sclera clear, conjunctiva pink Ears- hearing intact Oropharynx- clear Lungs- Clear to ausculation bilaterally, normal work of breathing Chest- pacemaker pocket is well healed Heart- Regular rate and rhythm, no  murmurs, rubs or gallops, PMI not laterally displaced GI- soft, NT, ND, + BS Extremities- no clubbing, cyanosis, or edema  Pacemaker interrogation- reviewed in detail today,  See PACEART report  Assessment and Plan:  1. Sick sinus syndrome Pacemaker has reached ERI See Pace Art report Reprogrammed to hysteresis rate of 50 today Risks, benefits, and alternatives to pacemaker pulse generator replacement were discussed in detail today.  The patient understands that risks include but are not limited to bleeding, infection, pneumothorax, perforation, tamponade, vascular damage, renal failure, MI, stroke, death, damage to his existing leads, and lead dislodgement and wishes to proceed.  We will therefore schedule the procedure at the next available time.  He will hold coumadin for 2 days prior to the procedure. Prior echo reveals preserved EF  2. Prior PTE Continue long term anticoagulation with coumadin Hold for 2 days prior to the procedure  3. Obesity Weight loss encouraged  Hillis Range MD, [facility] 07/22/2015 11:13 AM

## 2015-07-25 MED ORDER — DEXTROSE 5 % IV SOLN: 3.0000 g | INTRAVENOUS | Status: DC

## 2015-07-25 MED ORDER — CEFAZOLIN SODIUM-DEXTROSE 2-3 GM-% IV SOLR: 2.0000 g | INTRAVENOUS | Status: DC

## 2015-07-26 ENCOUNTER — Encounter (HOSPITAL_COMMUNITY)
Admission: RE | Disposition: A | Discharge status: Home / Self Care | Payer: Self-pay | Source: Ambulatory Visit | Attending: Internal Medicine

## 2015-07-26 ENCOUNTER — Ambulatory Visit (HOSPITAL_COMMUNITY)
Admission: RE | Admit: 2015-07-26 | Discharge: 2015-07-26 | Disposition: A | Discharge status: Home / Self Care | Payer: Medicare HMO | Source: Ambulatory Visit | Attending: Internal Medicine | Admitting: Internal Medicine

## 2015-07-26 DIAGNOSIS — I2510 Atherosclerotic heart disease of native coronary artery without angina pectoris: Secondary | ICD-10-CM | POA: Diagnosis not present

## 2015-07-26 DIAGNOSIS — G4733 Obstructive sleep apnea (adult) (pediatric): Secondary | ICD-10-CM | POA: Diagnosis not present

## 2015-07-26 DIAGNOSIS — Z7901 Long term (current) use of anticoagulants: Secondary | ICD-10-CM | POA: Diagnosis not present

## 2015-07-26 DIAGNOSIS — Z7982 Long term (current) use of aspirin: Secondary | ICD-10-CM | POA: Diagnosis not present

## 2015-07-26 DIAGNOSIS — I10 Essential (primary) hypertension: Secondary | ICD-10-CM | POA: Diagnosis not present

## 2015-07-26 DIAGNOSIS — I272 Other secondary pulmonary hypertension: Secondary | ICD-10-CM | POA: Diagnosis not present

## 2015-07-26 DIAGNOSIS — Z955 Presence of coronary angioplasty implant and graft: Secondary | ICD-10-CM | POA: Insufficient documentation

## 2015-07-26 DIAGNOSIS — E119 Type 2 diabetes mellitus without complications: Secondary | ICD-10-CM | POA: Diagnosis not present

## 2015-07-26 DIAGNOSIS — I495 Sick sinus syndrome: Secondary | ICD-10-CM | POA: Insufficient documentation

## 2015-07-26 DIAGNOSIS — E669 Obesity, unspecified: Secondary | ICD-10-CM | POA: Diagnosis not present

## 2015-07-26 DIAGNOSIS — G9001 Carotid sinus syncope: Secondary | ICD-10-CM | POA: Diagnosis present

## 2015-07-26 DIAGNOSIS — Z8673 Personal history of transient ischemic attack (TIA), and cerebral infarction without residual deficits: Secondary | ICD-10-CM | POA: Diagnosis not present

## 2015-07-26 DIAGNOSIS — Z86711 Personal history of pulmonary embolism: Secondary | ICD-10-CM | POA: Diagnosis not present

## 2015-07-26 DIAGNOSIS — E782 Mixed hyperlipidemia: Secondary | ICD-10-CM | POA: Diagnosis not present

## 2015-07-26 DIAGNOSIS — Z45010 Encounter for checking and testing of cardiac pacemaker pulse generator [battery]: Secondary | ICD-10-CM | POA: Diagnosis not present

## 2015-07-26 HISTORY — PX: EP IMPLANTABLE DEVICE: SHX172B

## 2015-07-26 LAB — PROTIME-INR
INR: 1.56 — AB (ref 0.00–1.49)
PROTHROMBIN TIME: 18.7 s — AB (ref 11.6–15.2)

## 2015-07-26 LAB — SURGICAL PCR SCREEN
MRSA, PCR: NEGATIVE
STAPHYLOCOCCUS AUREUS: POSITIVE — AB

## 2015-07-26 LAB — BASIC METABOLIC PANEL
ANION GAP: 8 (ref 5–15)
BUN: 17 mg/dL (ref 6–20)
CALCIUM: 9.3 mg/dL (ref 8.9–10.3)
CO2: 28 mmol/L (ref 22–32)
Chloride: 104 mmol/L (ref 101–111)
Creatinine, Ser: 1.22 mg/dL (ref 0.61–1.24)
GFR calc non Af Amer: 55 mL/min — ABNORMAL LOW (ref >60)
GLUCOSE: 148 mg/dL — AB (ref 65–99)
POTASSIUM: 4 mmol/L (ref 3.5–5.1)
Sodium: 140 mmol/L (ref 135–145)

## 2015-07-26 LAB — CUP PACEART INCLINIC DEVICE CHECK
Battery Impedance: 6772 Ohm
Battery Remaining Longevity: 1 mo
Battery Voltage: 2.62 V
Lead Channel Pacing Threshold Pulse Width: 0.4 ms
Lead Channel Setting Pacing Amplitude: 2.75 V
Lead Channel Setting Sensing Sensitivity: 2 mV
MDC IDC MSMT LEADCHNL RA IMPEDANCE VALUE: 67 Ohm
MDC IDC MSMT LEADCHNL RV IMPEDANCE VALUE: 515 Ohm
MDC IDC MSMT LEADCHNL RV PACING THRESHOLD AMPLITUDE: 1 V
MDC IDC MSMT LEADCHNL RV SENSING INTR AMPL: 5.6 mV
MDC IDC SESS DTM: 20160902145856
MDC IDC SET LEADCHNL RV PACING PULSEWIDTH: 0.4 ms
MDC IDC STAT BRADY RV PERCENT PACED: 1 %

## 2015-07-26 LAB — CBC
HEMATOCRIT: 43.9 % (ref 39.0–52.0)
HEMOGLOBIN: 14.6 g/dL (ref 13.0–17.0)
MCH: 32.4 pg (ref 26.0–34.0)
MCHC: 33.3 g/dL (ref 30.0–36.0)
MCV: 97.6 fL (ref 78.0–100.0)
Platelets: 209 10*3/uL (ref 150–400)
RBC: 4.5 MIL/uL (ref 4.22–5.81)
RDW: 14.1 % (ref 11.5–15.5)
WBC: 4.9 10*3/uL (ref 4.0–10.5)

## 2015-07-26 LAB — GLUCOSE, CAPILLARY
GLUCOSE-CAPILLARY: 143 mg/dL — AB (ref 65–99)
GLUCOSE-CAPILLARY: 120 mg/dL — AB (ref 65–99)

## 2015-07-26 SURGERY — PPM/BIV PPM GENERATOR CHANGEOUT
Anesthesia: LOCAL

## 2015-07-26 MED ORDER — MUPIROCIN 2 % EX OINT
TOPICAL_OINTMENT | CUTANEOUS | Status: AC
  Administered 2015-07-26: 1
  Filled 2015-07-26: qty 22

## 2015-07-26 MED ORDER — LIDOCAINE HCL (PF) 1 % IJ SOLN
INTRAMUSCULAR | Status: AC
  Filled 2015-07-26: qty 60

## 2015-07-26 MED ORDER — CEFAZOLIN SODIUM-DEXTROSE 2-3 GM-% IV SOLR
INTRAVENOUS | Status: DC | PRN
  Administered 2015-07-26: 2 g via INTRAVENOUS

## 2015-07-26 MED ORDER — SODIUM CHLORIDE 0.9 % IR SOLN
Status: AC
  Filled 2015-07-26: qty 2

## 2015-07-26 MED ORDER — SODIUM CHLORIDE 0.9 % IV SOLN
INTRAVENOUS | Status: DC
  Administered 2015-07-26: 13:00:00 via INTRAVENOUS

## 2015-07-26 MED ORDER — ACETAMINOPHEN 325 MG PO TABS
325.0000 mg | ORAL_TABLET | ORAL | Status: DC | PRN
  Filled 2015-07-26: qty 2

## 2015-07-26 MED ORDER — ONDANSETRON HCL 4 MG/2ML IJ SOLN: 4.0000 mg | Freq: Four times a day (QID) | INTRAMUSCULAR | Status: DC | PRN

## 2015-07-26 MED ORDER — SODIUM CHLORIDE 0.9 % IJ SOLN: 3.0000 mL | Freq: Two times a day (BID) | INTRAMUSCULAR | Status: DC

## 2015-07-26 MED ORDER — SODIUM CHLORIDE 0.9 % IR SOLN
Status: DC | PRN
  Administered 2015-07-26: 15:00:00

## 2015-07-26 MED ORDER — CHLORHEXIDINE GLUCONATE 4 % EX LIQD
60.0000 mL | Freq: Once | CUTANEOUS | Status: DC
Start: 2015-07-26 — End: 2015-07-26

## 2015-07-26 MED ORDER — MUPIROCIN 2 % EX OINT
1.0000 "application " | TOPICAL_OINTMENT | Freq: Once | CUTANEOUS | Status: DC
  Filled 2015-07-26: qty 22

## 2015-07-26 MED ORDER — MIDAZOLAM HCL 5 MG/5ML IJ SOLN
INTRAMUSCULAR | Status: DC | PRN
  Administered 2015-07-26: 1 mg via INTRAVENOUS

## 2015-07-26 MED ORDER — SODIUM CHLORIDE 0.9 % IR SOLN: 80.0000 mg | Status: DC

## 2015-07-26 MED ORDER — SODIUM CHLORIDE 0.9 % IJ SOLN: 3.0000 mL | INTRAMUSCULAR | Status: DC | PRN

## 2015-07-26 MED ORDER — SODIUM CHLORIDE 0.9 % IV SOLN: 250.0000 mL | INTRAVENOUS | Status: DC | PRN

## 2015-07-26 MED ORDER — MIDAZOLAM HCL 5 MG/5ML IJ SOLN
INTRAMUSCULAR | Status: AC
  Filled 2015-07-26: qty 25

## 2015-07-26 SURGICAL SUPPLY — 5 items
CABLE SURGICAL S-101-97-12 (CABLE) ×1 IMPLANT
PACEMAKER ADAPTA DR ADDRL1 (Pacemaker) IMPLANT
PAD DEFIB LIFELINK (PAD) ×1 IMPLANT
PPM ADAPTA DR ADDRL1 (Pacemaker) ×2 IMPLANT
TRAY PACEMAKER INSERTION (CUSTOM PROCEDURE TRAY) ×1 IMPLANT

## 2015-07-26 NOTE — Interval H&P Note (Signed)
History and Physical Interval Note:  07/26/2015 12:55 PM  [name]  has presented today for surgery, with the diagnosis of ERI  The various methods of treatment have been discussed with the patient and family. After consideration of risks, benefits and other options for treatment, the patient has consented to  Procedure(s): PPM Generator Changeout (N/A) as a surgical intervention .  The patient's history has been reviewed, patient examined, no change in status, stable for surgery.  I have reviewed the patient's chart and labs.  Questions were answered to the patient's satisfaction.     Hillis Range

## 2015-07-26 NOTE — Discharge Instructions (Signed)
Start coumadin tonight  Pacemaker Battery Change, Care After Refer to this sheet in the next few weeks. These instructions provide you with information on caring for yourself after your procedure. Your health care provider may also give you more specific instructions. Your treatment has been planned according to current medical practices, but problems sometimes occur. Call your health care provider if you have any problems or questions after your procedure. WHAT TO EXPECT AFTER THE PROCEDURE After your procedure, it is typical to have the following sensations:  Soreness at the pacemaker site. HOME CARE INSTRUCTIONS   Keep the incision clean and dry.  Unless advised otherwise, you may shower beginning 48 hours after your procedure.  For the first week after the replacement, avoid stretching motions that pull at the incision site, and avoid heavy exercise with the arm that is on the same side as the incision.  Take medicines only as directed by your health care provider.  Keep all follow-up visits as directed by your health care provider. SEEK MEDICAL CARE IF:   You have pain at the incision site that is not relieved by over-the-counter or prescription medicine.  There is drainage or pus from the incision site.  There is swelling larger than a lime at the incision site.  You develop red streaking that extends above or below the incision site.  You feel brief, intermittent palpitations, light-headedness, or any symptoms that you feel might be related to your heart. SEEK IMMEDIATE MEDICAL CARE IF:   You experience chest pain that is different than the pain at the pacemaker site.  You experience shortness of breath.  You have palpitations or irregular heartbeat.  You have light-headedness that does not go away quickly.  You faint.  You have pain that gets worse and is not relieved by medicine. Document Released: 08/26/2013 Document Revised: 03/22/2014 Document Reviewed:  08/26/2013 [facility] Patient Information 2015 [place], [place]. This information is not intended to replace advice given to you by your health care provider. Make sure you discuss any questions you have with your health care provider.

## 2015-07-26 NOTE — H&P (View-Only) (Signed)
PCP: XAJE A HASANAJ, MD Primary Cardiologist:  Dr McDowell  [name] is a 80 y.o. male who presents today for electrophysiology followup.  He has not been seen by me in 2 years.  He has not been compliant with device follow-up.  He now presents at ERI.  He reports fatigue and decreased exercise tolerance which he attributes to this.   He is limited by DJD.  He struggles with his weight.  Per my office notes previously he has some degree of chronic fatigue and debilitation.   He appears to have been chronically SOB.  Today, he denies symptoms of palpitations, chest pain,  lower extremity edema,  presyncope, or syncope.  The patient is otherwise without complaint today.   Past Medical History  Diagnosis Date  . Type 2 diabetes mellitus   . Essential hypertension, benign   . Carotid sinus syndrome     Status post pacemaker  . Mixed hyperlipidemia   . Obstructive sleep apnea     CPAP  . Pulmonary hypertension     Severe, RVSP 85-90 mmHg March 2014   . Coronary atherosclerosis of native coronary artery     DES to PL 2006, mild to moderate residual disease 2010   . Subdural hematoma     January 2014, occurred on Coumadin  . Pulmonary emboli     Diagnosed 2012 and 2014, IVC filter March 2014, recurrent March 2014 - thromboembolic therapy at [facility] and placed back on anticoagulation  . Pacemaker 1986    Medtronic  . Stroke 11/2012   Past Surgical History  Procedure Laterality Date  . Pacemaker implanted   07/16/2005    Medtronic Kappa 900 DR  . Filtering procedure  11/2012    placed due to blood clots from the lungs  . Arm surgery Right   . Cholecystectomy  2011  . Cataract extraction w/phaco Right 02/18/2014    Procedure: CATARACT EXTRACTION PHACO AND INTRAOCULAR LENS PLACEMENT (IOC);  Surgeon: Kerry Hunt, MD;  Location: [facility];  Service: Ophthalmology;  Laterality: Right;  CDE: 11.83  . Cataract extraction w/phaco Left 03/25/2014    Procedure: CATARACT EXTRACTION PHACO AND INTRAOCULAR  LENS PLACEMENT (IOC);  Surgeon: Kerry Hunt, MD;  Location: [facility];  Service: Ophthalmology;  Laterality: Left;  CDE 15.93    Current Outpatient Prescriptions  Medication Sig Dispense Refill  . allopurinol (ZYLOPRIM) 300 MG tablet Take 300 mg by mouth daily.      . aspirin 81 MG tablet Take 81 mg by mouth daily.      . atorvastatin (LIPITOR) 40 MG tablet Take 40 mg by mouth daily.    . carvedilol (COREG) 6.25 MG tablet Take 1 tablet (6.25 mg total) by mouth 2 (two) times daily with a meal. 180 tablet 3  . colchicine 0.6 MG tablet Take 0.6 mg by mouth as needed.    . fenofibrate (TRICOR) 145 MG tablet Take 145 mg by mouth daily.      . ferrous sulfate 325 (65 FE) MG tablet Take 325 mg by mouth 3 (three) times daily with meals.    . furosemide (LASIX) 20 MG tablet Take 20 mg by mouth daily.     . glimepiride (AMARYL) 2 MG tablet Take 2 mg by mouth daily before breakfast.    . levETIRAcetam (KEPPRA) 750 MG tablet Take 750 mg by mouth daily.    . losartan (COZAAR) 25 MG tablet Take 1 tablet (25 mg total) by mouth daily. 90 tablet 3  . Omega-3   Fatty Acids (FISH OIL) 1000 MG CAPS Take 1 capsule by mouth 3 (three) times daily.    . omeprazole (PRILOSEC) 40 MG capsule Take 40 mg by mouth daily.      . sitaGLIPtin (JANUVIA) 100 MG tablet Take 100 mg by mouth daily.    . warfarin (COUMADIN) 5 MG tablet Take 5 mg by mouth as directed. Managed by Dr. Hasanaj's office     No current facility-administered medications for this visit.    Physical Exam: Filed Vitals:   07/22/15 1039  BP: 118/60  Pulse: 76  Height: 6' 1" (1.854 m)  Weight: 131.09 kg (289 lb)  SpO2: 94%    GEN- The patient is overweight appearing, alert and oriented x 3 today.   Head- normocephalic, atraumatic Eyes-  Sclera clear, conjunctiva pink Ears- hearing intact Oropharynx- clear Lungs- Clear to ausculation bilaterally, normal work of breathing Chest- pacemaker pocket is well healed Heart- Regular rate and rhythm, no  murmurs, rubs or gallops, PMI not laterally displaced GI- soft, NT, ND, + BS Extremities- no clubbing, cyanosis, or edema  Pacemaker interrogation- reviewed in detail today,  See PACEART report  Assessment and Plan:  1. Sick sinus syndrome Pacemaker has reached ERI See Pace Art report Reprogrammed to hysteresis rate of 50 today Risks, benefits, and alternatives to pacemaker pulse generator replacement were discussed in detail today.  The patient understands that risks include but are not limited to bleeding, infection, pneumothorax, perforation, tamponade, vascular damage, renal failure, MI, stroke, death, damage to his existing leads, and lead dislodgement and wishes to proceed.  We will therefore schedule the procedure at the next available time.  He will hold coumadin for 2 days prior to the procedure. Prior echo reveals preserved EF  2. Prior PTE Continue long term anticoagulation with coumadin Hold for 2 days prior to the procedure  3. Obesity Weight loss encouraged  Rigley Dorthie Santini MD, FACC 07/22/2015 11:13 AM   

## 2015-07-26 NOTE — Progress Notes (Addendum)
Pt unsure if he should start coumadin tonight or tomorrow. Dr Allred/ Gypsy Balsam Np paged. Cath lab called to help assist in finding out the answer. Per Dr Johney Frame pt to start coumadin tonight./

## 2015-07-27 ENCOUNTER — Encounter (HOSPITAL_COMMUNITY): Payer: Self-pay | Admitting: Internal Medicine

## 2015-07-27 MED FILL — Lidocaine HCl Local Preservative Free (PF) Inj 1%: INTRAMUSCULAR | Qty: 60 | Status: AC

## 2015-08-04 ENCOUNTER — Ambulatory Visit (INDEPENDENT_AMBULATORY_CARE_PROVIDER_SITE_OTHER): Payer: Medicare HMO | Admitting: *Deleted

## 2015-08-04 DIAGNOSIS — I495 Sick sinus syndrome: Secondary | ICD-10-CM

## 2015-08-04 LAB — CUP PACEART INCLINIC DEVICE CHECK
Battery Impedance: 100 Ohm
Battery Voltage: 2.8 V
Brady Statistic AP VP Percent: 2.9 %
Brady Statistic AS VP Percent: 22.4 %
Date Time Interrogation Session: 20160915173007
Lead Channel Pacing Threshold Amplitude: 0.875 V
Lead Channel Pacing Threshold Pulse Width: 0.4 ms
Lead Channel Sensing Intrinsic Amplitude: 2.8 mV
Lead Channel Setting Pacing Amplitude: 2.75 V
Lead Channel Setting Pacing Pulse Width: 0.4 ms
Lead Channel Setting Sensing Sensitivity: 2 mV
MDC IDC MSMT LEADCHNL RA IMPEDANCE VALUE: 382 Ohm
MDC IDC MSMT LEADCHNL RV IMPEDANCE VALUE: 459 Ohm
MDC IDC MSMT LEADCHNL RV PACING THRESHOLD AMPLITUDE: 1.375 V
MDC IDC MSMT LEADCHNL RV PACING THRESHOLD PULSEWIDTH: 0.4 ms
MDC IDC MSMT LEADCHNL RV SENSING INTR AMPL: 5.6 mV
MDC IDC STAT BRADY AP VS PERCENT: 7.4 %
MDC IDC STAT BRADY AS VS PERCENT: 67.3 %

## 2015-08-04 NOTE — Progress Notes (Signed)
Wound check appointment. Steri-strips removed. Wound without redness or edema. Incision edges approximated, wound well healed. Normal device function. Thresholds, sensing, and impedances consistent with implant measurements. Device programmed at appropriate safety margins. Histogram distribution appropriate for patient and level of activity. (1) mode switch episode (<0.1%)---<30 sec, no EGM. No high ventricular rates noted. Patient educated about wound care, arm mobility, lifting restrictions. ROV in 3 months with JA/E.

## 2015-08-08 ENCOUNTER — Encounter: Payer: Self-pay | Admitting: Internal Medicine

## 2015-08-09 ENCOUNTER — Ambulatory Visit (INDEPENDENT_AMBULATORY_CARE_PROVIDER_SITE_OTHER): Payer: Medicare HMO | Admitting: Cardiology

## 2015-08-09 ENCOUNTER — Encounter: Payer: Self-pay | Admitting: Cardiology

## 2015-08-09 VITALS — BP 138/83 | HR 75 | Ht 73.0 in | Wt 291.8 lb

## 2015-08-09 DIAGNOSIS — I495 Sick sinus syndrome: Secondary | ICD-10-CM

## 2015-08-09 DIAGNOSIS — R0602 Shortness of breath: Secondary | ICD-10-CM

## 2015-08-09 DIAGNOSIS — I2699 Other pulmonary embolism without acute cor pulmonale: Secondary | ICD-10-CM

## 2015-08-09 DIAGNOSIS — I2510 Atherosclerotic heart disease of native coronary artery without angina pectoris: Secondary | ICD-10-CM

## 2015-08-09 DIAGNOSIS — Z950 Presence of cardiac pacemaker: Secondary | ICD-10-CM

## 2015-08-09 NOTE — Patient Instructions (Signed)
Your physician recommends that you continue on your current medications as directed. Please refer to the Current Medication list given to you today. Your physician recommends that you schedule a follow-up appointment in: 6 months. You will receive a reminder letter in the mail in about 4 months reminding you to call and schedule your appointment. If you don't receive this letter, please contact our office. 

## 2015-08-09 NOTE — Progress Notes (Signed)
Cardiology Office Note  Date: 08/09/2015   ID: SHION BLUESTEIN, DOB [birth year], MRN [id number]  PCP: Toma Deiters, MD  Primary Cardiologist: Nona Dell, MD   Chief Complaint  Patient presents with  . Coronary Artery Disease  . History of recurrent pulmonary emboli    History of Present Illness: [name] is a medically complex 80 y.o. male last seen in October 2015. He had a recent visit with Dr. Johney Frame in the device clinic. Device found to be at [facility], adjustments made, and he underwent replacement of Medtronic pulse generator on September 6. He is here today with his wife for a routine follow-up visit. He remains chronically short of breath, does report having more energy since his pacemaker adjustment.  We reviewed his medications, he is not reporting any angina on current treatment. Blood pressure is reasonably controlled today.  Complex medical history includes subdural hematoma (on Coumadin), recurring pulmonary emboli despite IVC filter (off anticoagulation at that time) and requiring thrombolytic therapy, cautious resumption of Coumadin at [facility] March 2014. Coumadin is followed by Dr. Olena Leatherwood. He denies any bleeding problems.  Recent ECG showed normal sinus rhythm.   Past Medical History  Diagnosis Date  . Type 2 diabetes mellitus   . Essential hypertension, benign   . Carotid sinus syndrome     Status post pacemaker  . Mixed hyperlipidemia   . Obstructive sleep apnea     CPAP  . Pulmonary hypertension     Severe, RVSP 85-90 mmHg March 2014   . Coronary atherosclerosis of native coronary artery     DES to PL 2006, mild to moderate residual disease 2010   . Subdural hematoma     January 2014, occurred on Coumadin  . Pulmonary emboli     Diagnosed 2012 and 2014, IVC filter March 2014, recurrent March 2014 - throm[facility]embolic therapy at [facility] and placed back on anticoagulation  . Pacemaker 1986    Medtronic  . Stroke 11/2012     Current Outpatient  Prescriptions  Medication Sig Dispense Refill  . allopurinol (ZYLOPRIM) 300 MG tablet Take 300 mg by mouth daily after breakfast.     . aspirin EC 81 MG tablet Take 81 mg by mouth daily after breakfast.    . atorvastatin (LIPITOR) 40 MG tablet Take 40 mg by mouth daily after breakfast.     . carvedilol (COREG) 6.25 MG tablet Take 1 tablet (6.25 mg total) by mouth 2 (two) times daily with a meal. 180 tablet 3  . citalopram (CELEXA) 20 MG tablet Take 20 mg by mouth daily.  0  . colchicine 0.6 MG tablet Take 0.6 mg by mouth daily as needed (gout attacks).     . fenofibrate (TRICOR) 145 MG tablet Take 145 mg by mouth daily.      . ferrous sulfate 325 (65 FE) MG tablet Take 325 mg by mouth 3 (three) times daily with meals.    . furosemide (LASIX) 20 MG tablet Take 20 mg by mouth daily.     Marland Kitchen gemfibrozil (LOPID) 600 MG tablet Take 600 mg by mouth daily.    Marland Kitchen glimepiride (AMARYL) 2 MG tablet Take 2 mg by mouth 2 (two) times daily.     Marland Kitchen levETIRAcetam (KEPPRA) 750 MG tablet Take 750 mg by mouth 2 (two) times daily.     Marland Kitchen losartan (COZAAR) 50 MG tablet Take 50 mg by mouth daily.    . metFORMIN (GLUCOPHAGE) 1000 MG tablet Take 1,000 mg by mouth  2 (two) times daily with a meal.    . Omega-3 Fatty Acids (FISH OIL) 1000 MG CAPS Take 1 capsule by mouth 3 (three) times daily.    Marland Kitchen omeprazole (PRILOSEC) 40 MG capsule Take 40 mg by mouth daily.      . sitaGLIPtin (JANUVIA) 100 MG tablet Take 100 mg by mouth daily.    Marland Kitchen warfarin (COUMADIN) 3 MG tablet Take 3 mg by mouth every other day. Alternate with 4 mg tablet (daily after breakfast) or as directed by Dr. Jeronimo Greaves    . warfarin (COUMADIN) 4 MG tablet Take 4 mg by mouth every other day. Alternate with 3 mg tablet (daily after breakfast) or as directed by Dr. Jeronimo Greaves     No current facility-administered medications for this visit.    Allergies:  Nitroglycerin   Social History: The patient  reports that he has never smoked. He has never used smokeless tobacco.  He reports that he does not drink alcohol or use illicit drugs.   ROS:  Please see the history of present illness. Otherwise, complete review of systems is positive for chronic shortness of breath.  All other systems are reviewed and negative.   Physical Exam: VS:  BP 138/83 mmHg  Pulse 75  Ht  (1.854 m)  Wt 291 lb 12.8 oz (132.36 kg)  BMI 38.51 kg/m2  SpO2 91%, BMI Body mass index is 38.51 kg/(m^2).  Wt Readings from Last 3 Encounters:  08/09/15 291 lb 12.8 oz (132.36 kg)  07/26/15 282 lb (127.914 kg)  07/22/15 289 lb (131.09 kg)     Morbidly obese male, appears comfortable at rest. HEENT: Conjunctiva and lids normal, oropharynx clear.  Neck: Supple, increased girth with difficult to assess JVP, no carotid bruits, no thyromegaly.  Lungs: Clear to auscultation, nonlabored breathing at rest.  Thorax: Left sided device can site stable, prominent. Cardiac: Regular rate and rhythm, no S3, soft apical systolic murmur, no pericardial rub.  Abdomen: Soft, nontender, protuberant, bowel sounds present.  Extremities: Chronic appearing edema, distal pulses 1-2+.    ECG: ECG is not ordered today.   Recent Labwork: 07/26/2015: BUN 17; Creatinine, Ser 1.22; Hemoglobin 14.6; Platelets 209; Potassium 4.0; Sodium 140   Other Studies Reviewed Today:  Echocardiogram from January 2015 demonstrated mild LVH with LVEF 60-65%, basal inferior hypokinesis, grade 1 diastolic dysfunction, mild left atrial enlargement, MAC and mild mitral regurgitation, device wire in the right heart, mild tricuspid regurgitation, RV-RA gradient 38 mm mercury - significantly improved in comparison to previous study report March 2014.  Assessment and Plan:  1. Symptomatically stable CAD without active angina on medical therapy. We will continue observation. LVEF normal range prior echocardiogram from last year.  2. Sick sinus syndrome status post Medtronic pacemaker placement with recent generator change per  Dr. Johney Frame.  3. Chronic shortness of breath, multifactorial. No significant change.  4. History of recurrent pulmonary emboli, IVC filter in place, and on Coumadin which is followed and managed by Dr. Olena Leatherwood.  5. History of pulmonary hypertension, significant improvement by echocardiogram from last year as outlined above.  Current medicines were reviewed with the patient today.  Disposition: FU with me in 6 months.   Signed, Jonelle Sidle, MD, [facility] 08/09/2015 10:33 AM    [facility] at [facility] [address] [place], [place], [place] [address] Phone: [phone]; Fax: [phone]

## 2015-08-12 ENCOUNTER — Encounter: Payer: Self-pay | Admitting: Internal Medicine

## 2015-11-25 ENCOUNTER — Encounter: Payer: Self-pay | Admitting: Internal Medicine

## 2015-11-25 ENCOUNTER — Ambulatory Visit (INDEPENDENT_AMBULATORY_CARE_PROVIDER_SITE_OTHER): Payer: Medicare HMO | Admitting: Internal Medicine

## 2015-11-25 VITALS — BP 128/90 | HR 92 | Ht 73.0 in | Wt 286.0 lb

## 2015-11-25 DIAGNOSIS — I495 Sick sinus syndrome: Secondary | ICD-10-CM

## 2015-11-25 LAB — CUP PACEART INCLINIC DEVICE CHECK
Battery Impedance: 100 Ohm
Brady Statistic AP VP Percent: 7 %
Brady Statistic AP VS Percent: 29 %
Brady Statistic AS VP Percent: 7 %
Brady Statistic AS VS Percent: 57 %
Implantable Lead Implant Date: 20060828
Implantable Lead Location: 753859
Lead Channel Impedance Value: 527 Ohm
Lead Channel Pacing Threshold Amplitude: 0.75 V
Lead Channel Sensing Intrinsic Amplitude: 4 mV
Lead Channel Sensing Intrinsic Amplitude: 8 mV
Lead Channel Setting Pacing Pulse Width: 0.4 ms
MDC IDC LEAD IMPLANT DT: 20060828
MDC IDC LEAD LOCATION: 753860
MDC IDC MSMT BATTERY REMAINING LONGEVITY: 156 mo
MDC IDC MSMT BATTERY VOLTAGE: 2.8 V
MDC IDC MSMT LEADCHNL RA IMPEDANCE VALUE: 424 Ohm
MDC IDC MSMT LEADCHNL RA PACING THRESHOLD PULSEWIDTH: 0.4 ms
MDC IDC MSMT LEADCHNL RV PACING THRESHOLD AMPLITUDE: 1.25 V
MDC IDC MSMT LEADCHNL RV PACING THRESHOLD PULSEWIDTH: 0.4 ms
MDC IDC SESS DTM: 20170106101210
MDC IDC SET LEADCHNL RA PACING AMPLITUDE: 2 V
MDC IDC SET LEADCHNL RV PACING AMPLITUDE: 3 V
MDC IDC SET LEADCHNL RV SENSING SENSITIVITY: 2.8 mV

## 2015-11-25 NOTE — Patient Instructions (Signed)
Your physician recommends that you continue on your current medications as directed. Please refer to the Current Medication list given to you today. Device check on 02/27/16. Your physician recommends that you schedule a follow-up appointment in 1 year. You can schedule this appointment today or you can wait for your letter to come in the mail in about 10 months reminding you to call and schedule this appointment. If you do not receive this letter, please contact our office for your appointment.  

## 2015-11-25 NOTE — Progress Notes (Signed)
PCP: Toma DeitersXAJE A HASANAJ, MD Primary Cardiologist:  Dr Shara BlazingMcDowell  Leticia V Shuford is a 81 y.o. male who presents today for electrophysiology followup.  Doing reasonably well at this time.  He struggles with his weight.  He has chronic fatigue and debilitation.   He appears to have been chronically SOB.  He also has occasional dizziness.  Today, he denies symptoms of palpitations, chest pain,  lower extremity edema,  presyncope, or syncope.  The patient is otherwise without complaint today.   Past Medical History  Diagnosis Date  . Type 2 diabetes mellitus (HCC)   . Essential hypertension, benign   . Carotid sinus syndrome     Status post pacemaker  . Mixed hyperlipidemia   . Obstructive sleep apnea     CPAP  . Pulmonary hypertension (HCC)     Severe, RVSP 85-90 mmHg March 2014   . Coronary atherosclerosis of native coronary artery     DES to PL 2006, mild to moderate residual disease 2010   . Subdural hematoma [facility](HCC)     January 2014, occurred on Coumadin  . Pulmonary emboli [facility](HCC)     Diagnosed 2012 and 2014, IVC filter March 2014, recurrent March 2014 - [facility] and placed back on anticoagulation  . Pacemaker 1986    Medtronic  . Stroke [facility](HCC) 11/2012   Past Surgical History  Procedure Laterality Date  . Pacemaker implanted   07/16/2005    Medtronic Kappa 900 DR  . Filtering procedure  11/2012    placed due to blood clots from the lungs  . Arm surgery Right   . Cholecystectomy  2011  . Cataract extraction w/phaco Right 02/18/2014    Procedure: CATARACT EXTRACTION PHACO AND INTRAOCULAR LENS PLACEMENT (IOC);  Surgeon: Gemma PayorKerry Hunt, MD;  Location: [facility];  Service: Ophthalmology;  Laterality: Right;  CDE: 11.83  . Cataract extraction w/phaco Left 03/25/2014    Procedure: CATARACT EXTRACTION PHACO AND INTRAOCULAR LENS PLACEMENT (IOC);  Surgeon: Gemma PayorKerry Hunt, MD;  Location: [facility];  Service: Ophthalmology;  Laterality: Left;  CDE 15.93  . Ep implantable device N/A 07/26/2015     MDT Adapta L gen change by Dr Johney FrameAllred    Current Outpatient Prescriptions  Medication Sig Dispense Refill  . allopurinol (ZYLOPRIM) 300 MG tablet Take 300 mg by mouth daily after breakfast.     . aspirin EC 81 MG tablet Take 81 mg by mouth daily after breakfast.    . atorvastatin (LIPITOR) 40 MG tablet Take 40 mg by mouth daily after breakfast.     . carvedilol (COREG) 6.25 MG tablet Take 1 tablet (6.25 mg total) by mouth 2 (two) times daily with a meal. 180 tablet 3  . citalopram (CELEXA) 20 MG tablet Take 20 mg by mouth daily.  0  . colchicine 0.6 MG tablet Take 0.6 mg by mouth daily as needed (gout attacks).     . fenofibrate (TRICOR) 145 MG tablet Take 145 mg by mouth daily.      . ferrous sulfate 325 (65 FE) MG tablet Take 325 mg by mouth 3 (three) times daily with meals.    . furosemide (LASIX) 20 MG tablet Take 20 mg by mouth daily.     Marland Kitchen. gemfibrozil (LOPID) 600 MG tablet Take 600 mg by mouth daily.    Marland Kitchen. glimepiride (AMARYL) 2 MG tablet Take 2 mg by mouth 2 (two) times daily.     Marland Kitchen. levETIRAcetam (KEPPRA) 750 MG tablet Take 750 mg by mouth 2 (two)  times daily.     Marland Kitchen losartan (COZAAR) 50 MG tablet Take 50 mg by mouth daily.    . metFORMIN (GLUCOPHAGE) 1000 MG tablet Take 1,000 mg by mouth 2 (two) times daily with a meal.    . Omega-3 Fatty Acids (FISH OIL) 1000 MG CAPS Take 1 capsule by mouth 3 (three) times daily.    Marland Kitchen omeprazole (PRILOSEC) 40 MG capsule Take 40 mg by mouth daily.      . sitaGLIPtin (JANUVIA) 100 MG tablet Take 100 mg by mouth daily.    Marland Kitchen warfarin (COUMADIN) 3 MG tablet Take 3 mg by mouth every other day. Alternate with 4 mg tablet (daily after breakfast) or as directed by Dr. Jeronimo Greaves    . warfarin (COUMADIN) 4 MG tablet Take 4 mg by mouth every other day. Alternate with 3 mg tablet (daily after breakfast) or as directed by Dr. Jeronimo Greaves     No current facility-administered medications for this visit.    Physical Exam: Filed Vitals:   11/25/15 0955  BP: 128/90   Pulse: 92  Height: 6\' 1"  (1.854 m)  Weight: 286 lb (129.729 kg)  SpO2: 94%    GEN- The patient is overweight appearing, alert and oriented x 3 today.   Head- normocephalic, atraumatic Eyes-  Sclera clear, conjunctiva pink Ears- hearing intact Oropharynx- clear Lungs- Clear to ausculation bilaterally, normal work of breathing Chest- pacemaker pocket is well healed Heart- Regular rate and rhythm, no murmurs, rubs or gallops, PMI not laterally displaced GI- soft, NT, ND, + BS Extremities- no clubbing, cyanosis, or edema  Pacemaker interrogation- reviewed in detail today,  See PACEART report  Assessment and Plan:  1. Sick sinus syndrome Normal pacemaker function See Pace Art report No changes today  2. Prior PTE Continue long term anticoagulation with coumadin  3. Obesity Weight loss and regular exercise encouraged  Hillis Range MD, [facility] 11/25/2015 10:06 AM

## 2015-12-03 DIAGNOSIS — I2510 Atherosclerotic heart disease of native coronary artery without angina pectoris: Secondary | ICD-10-CM | POA: Diagnosis not present

## 2015-12-03 DIAGNOSIS — E1151 Type 2 diabetes mellitus with diabetic peripheral angiopathy without gangrene: Secondary | ICD-10-CM | POA: Diagnosis not present

## 2015-12-03 DIAGNOSIS — G40909 Epilepsy, unspecified, not intractable, without status epilepticus: Secondary | ICD-10-CM | POA: Diagnosis not present

## 2015-12-03 DIAGNOSIS — I509 Heart failure, unspecified: Secondary | ICD-10-CM | POA: Diagnosis not present

## 2015-12-03 DIAGNOSIS — I2782 Chronic pulmonary embolism: Secondary | ICD-10-CM | POA: Diagnosis not present

## 2015-12-03 DIAGNOSIS — I110 Hypertensive heart disease with heart failure: Secondary | ICD-10-CM | POA: Diagnosis not present

## 2015-12-03 DIAGNOSIS — D649 Anemia, unspecified: Secondary | ICD-10-CM | POA: Diagnosis not present

## 2015-12-03 DIAGNOSIS — E785 Hyperlipidemia, unspecified: Secondary | ICD-10-CM | POA: Diagnosis not present

## 2015-12-03 DIAGNOSIS — F331 Major depressive disorder, recurrent, moderate: Secondary | ICD-10-CM | POA: Diagnosis not present

## 2015-12-07 DIAGNOSIS — Z7901 Long term (current) use of anticoagulants: Secondary | ICD-10-CM | POA: Diagnosis not present

## 2016-01-04 DIAGNOSIS — Z7901 Long term (current) use of anticoagulants: Secondary | ICD-10-CM | POA: Diagnosis not present

## 2016-01-30 DIAGNOSIS — I5022 Chronic systolic (congestive) heart failure: Secondary | ICD-10-CM | POA: Diagnosis not present

## 2016-01-30 DIAGNOSIS — K5289 Other specified noninfective gastroenteritis and colitis: Secondary | ICD-10-CM | POA: Diagnosis not present

## 2016-01-30 DIAGNOSIS — E1121 Type 2 diabetes mellitus with diabetic nephropathy: Secondary | ICD-10-CM | POA: Diagnosis not present

## 2016-01-30 DIAGNOSIS — Z7901 Long term (current) use of anticoagulants: Secondary | ICD-10-CM | POA: Diagnosis not present

## 2016-01-30 DIAGNOSIS — I10 Essential (primary) hypertension: Secondary | ICD-10-CM | POA: Diagnosis not present

## 2016-01-30 DIAGNOSIS — Z6837 Body mass index (BMI) 37.0-37.9, adult: Secondary | ICD-10-CM | POA: Diagnosis not present

## 2016-02-07 DIAGNOSIS — R197 Diarrhea, unspecified: Secondary | ICD-10-CM | POA: Diagnosis not present

## 2016-02-08 DIAGNOSIS — I5022 Chronic systolic (congestive) heart failure: Secondary | ICD-10-CM | POA: Diagnosis not present

## 2016-02-08 DIAGNOSIS — I10 Essential (primary) hypertension: Secondary | ICD-10-CM | POA: Diagnosis not present

## 2016-02-14 ENCOUNTER — Ambulatory Visit (INDEPENDENT_AMBULATORY_CARE_PROVIDER_SITE_OTHER): Payer: Medicare HMO | Admitting: Cardiology

## 2016-02-14 ENCOUNTER — Encounter: Payer: Self-pay | Admitting: Cardiology

## 2016-02-14 VITALS — BP 109/73 | HR 73 | Ht 73.0 in | Wt 295.6 lb

## 2016-02-14 DIAGNOSIS — I272 Other secondary pulmonary hypertension: Secondary | ICD-10-CM

## 2016-02-14 DIAGNOSIS — E785 Hyperlipidemia, unspecified: Secondary | ICD-10-CM

## 2016-02-14 DIAGNOSIS — I10 Essential (primary) hypertension: Secondary | ICD-10-CM

## 2016-02-14 DIAGNOSIS — I495 Sick sinus syndrome: Secondary | ICD-10-CM

## 2016-02-14 DIAGNOSIS — I2510 Atherosclerotic heart disease of native coronary artery without angina pectoris: Secondary | ICD-10-CM | POA: Diagnosis not present

## 2016-02-14 DIAGNOSIS — R42 Dizziness and giddiness: Secondary | ICD-10-CM | POA: Diagnosis not present

## 2016-02-14 DIAGNOSIS — IMO0002 Reserved for concepts with insufficient information to code with codable children: Secondary | ICD-10-CM

## 2016-02-14 MED ORDER — LOSARTAN POTASSIUM 25 MG PO TABS: 25.0000 mg | ORAL_TABLET | Freq: Every day | ORAL | Status: DC

## 2016-02-14 NOTE — Progress Notes (Signed)
Cardiology Office Note  Date: 02/14/2016   ID: [name], DOB 24-Oct-1935, MRN [id number]  PCP: Toma Deiters, MD  Primary Cardiologist: Nona Dell, MD   Chief Complaint  Patient presents with  . Coronary Artery Disease    History of Present Illness: [name] is a 81 y.o. male last seen in September 2016. He presents today with his wife for a follow-up visit. He tells me that he has been feeling dizzy often times when he stands up, has actually had a fall recently. He does not report palpitations or chest pain with this.  I reviewed his medications which are outlined below and include Coreg, Lasix, and Cozaar. Blood pressure is low normal today at rest. We discussed reducing the dose of his Cozaar from 50 mg to 25 mg daily.  Interval follow-up noted with Dr. Johney Frame for device management.  Complex medical history includes subdural hematoma (on Coumadin), recurring pulmonary emboli despite IVC filter (off anticoagulation at that time) and requiring thrombolytic therapy, cautious resumption of Coumadin at [facility] March 2014. Coumadin is followed by Dr. Olena Leatherwood. He denies any bleeding problems.  He is on oxygen therapy at home, this was arranged by Dr. Olena Leatherwood. Paperwork was inadvertently sent to me for completion regarding continuation of therapy. We faxed this information to Dr. Olena Leatherwood.  Past Medical History  Diagnosis Date  . Type 2 diabetes mellitus (HCC)   . Essential hypertension, benign   . Carotid sinus syndrome     Status post pacemaker  . Mixed hyperlipidemia   . Obstructive sleep apnea     CPAP  . Pulmonary hypertension (HCC)     Severe, RVSP 85-90 mmHg March 2014   . Coronary atherosclerosis of native coronary artery     DES to PL 2006, mild to moderate residual disease 2010   . Subdural hematoma [facility])     January 2014, occurred on Coumadin  . Pulmonary emboli [facility])     Diagnosed 2012 and 2014, IVC filter March 2014, recurrent March 2014 -  thromboembolic therapy at [facility] and placed back on anticoagulation  . Pacemaker 1986    Medtronic  . Stroke [facility]) 11/2012    Past Surgical History  Procedure Laterality Date  . Pacemaker implanted   07/16/2005    Medtronic Kappa 900 DR  . Filtering procedure  11/2012    placed due to blood clots from the lungs  . Arm surgery Right   . Cholecystectomy  2011  . Cataract extraction w/phaco Right 02/18/2014    Procedure: CATARACT EXTRACTION PHACO AND INTRAOCULAR LENS PLACEMENT (IOC);  Surgeon: Gemma Payor, MD;  Location: [facility];  Service: Ophthalmology;  Laterality: Right;  CDE: 11.83  . Cataract extraction w/phaco Left 03/25/2014    Procedure: CATARACT EXTRACTION PHACO AND INTRAOCULAR LENS PLACEMENT (IOC);  Surgeon: Gemma Payor, MD;  Location: [facility];  Service: Ophthalmology;  Laterality: Left;  CDE 15.93  . Ep implantable device N/A 07/26/2015    MDT Adapta L gen change by Dr Johney Frame    Current Outpatient Prescriptions  Medication Sig Dispense Refill  . allopurinol (ZYLOPRIM) 300 MG tablet Take 300 mg by mouth daily after breakfast.     . aspirin EC 81 MG tablet Take 81 mg by mouth daily after breakfast.    . atorvastatin (LIPITOR) 40 MG tablet Take 40 mg by mouth daily after breakfast.     . carvedilol (COREG) 6.25 MG tablet Take 1 tablet (6.25 mg total) by mouth 2 (two) times daily  with a meal. 180 tablet 3  . citalopram (CELEXA) 20 MG tablet Take 20 mg by mouth daily.  0  . colchicine 0.6 MG tablet Take 0.6 mg by mouth daily as needed (gout attacks).     . fenofibrate (TRICOR) 145 MG tablet Take 145 mg by mouth daily.      . ferrous sulfate 325 (65 FE) MG tablet Take 325 mg by mouth 3 (three) times daily with meals.    . furosemide (LASIX) 20 MG tablet Take 20 mg by mouth daily.     Marland Kitchen gemfibrozil (LOPID) 600 MG tablet Take 600 mg by mouth daily.    Marland Kitchen glimepiride (AMARYL) 2 MG tablet Take 2 mg by mouth 2 (two) times daily.     Marland Kitchen levETIRAcetam (KEPPRA) 750 MG tablet Take 750 mg by mouth 2 (two)  times daily.     . metFORMIN (GLUCOPHAGE) 1000 MG tablet Take 1,000 mg by mouth 2 (two) times daily with a meal.    . Omega-3 Fatty Acids (FISH OIL) 1000 MG CAPS Take 1 capsule by mouth 3 (three) times daily.    Marland Kitchen omeprazole (PRILOSEC) 40 MG capsule Take 40 mg by mouth daily.      . sitaGLIPtin (JANUVIA) 100 MG tablet Take 100 mg by mouth daily.    Marland Kitchen warfarin (COUMADIN) 3 MG tablet Take 3 mg by mouth every other day. Alternate with 4 mg tablet (daily after breakfast) or as directed by Dr. Jeronimo Greaves    . warfarin (COUMADIN) 4 MG tablet Take 4 mg by mouth every other day. Alternate with 3 mg tablet (daily after breakfast) or as directed by Dr. Jeronimo Greaves    . losartan (COZAAR) 25 MG tablet Take 1 tablet (25 mg total) by mouth daily. 90 tablet 3   No current facility-administered medications for this visit.   Allergies:  Nitroglycerin   Social History: The patient  reports that he has never smoked. He has never used smokeless tobacco. He reports that he does not drink alcohol or use illicit drugs.   ROS:  Please see the history of present illness. Otherwise, complete review of systems is positive for chronic fatigue and shortness of breath.  All other systems are reviewed and negative.   Physical Exam: VS:  BP 109/73 mmHg  Pulse 73  Ht  (1.854 m)  Wt 295 lb 9.6 oz (134.083 kg)  BMI 39.01 kg/m2  SpO2 95%, BMI Body mass index is 39.01 kg/(m^2).  Wt Readings from Last 3 Encounters:  02/14/16 295 lb 9.6 oz (134.083 kg)  11/25/15 286 lb (129.729 kg)  08/09/15 291 lb 12.8 oz (132.36 kg)    Morbidly obese male, appears comfortable at rest. Wearing oxygen via nasal cannula. HEENT: Conjunctiva and lids normal, oropharynx clear.  Neck: Supple, increased girth with difficult to assess JVP, no carotid bruits, no thyromegaly.  Lungs: Clear to auscultation, nonlabored breathing at rest.  Thorax: Left sided device can site stable, prominent. Cardiac: Regular rate and rhythm, no S3, soft apical  systolic murmur, no pericardial rub.  Abdomen: Soft, nontender, protuberant, bowel sounds present.  Extremities: Chronic appearing edema, distal pulses 1-2+.   ECG: I personally reviewed the prior tracing from 07/26/2015 which showed sinus rhythm..  Recent Labwork: 07/26/2015: BUN 17; Creatinine, Ser 1.22; Hemoglobin 14.6; Platelets 209; Potassium 4.0; Sodium 140   Other Studies Reviewed Today  Echocardiogram 12/17/2013: Study Conclusions  - Left ventricle: The cavity size was normal. Wall thickness was increased in a pattern of mild LVH. Systolic  function was normal. The estimated ejection fraction was in the range of 60% to 65%. There is hypokinesis of the basalinferior myocardium. Doppler parameters are consistent with abnormal left ventricular relaxation (grade 1 diastolic dysfunction). - Aortic valve: Mildly calcified annulus. Trileaflet; mildly thickened leaflets. Trivial regurgitation. - Mitral valve: Calcified annulus. Mildly thickened leaflets . Mild regurgitation directed eccentrically. - Left atrium: The atrium was mildly dilated. The appendage was mildly to moderately dilated. - Right ventricle: Pacer wire or catheter noted in right ventricle. - Tricuspid valve: Mild regurgitation. Peak RV-RA gradient: 38mm Hg (S). - Pulmonary arteries: Systolic pressure could not be accurately estimated. Likely mildly increased based on RV-RA gradient, assuming normal CVP. - Inferior vena cava: Not visualized. Unable toestimate CVP. - Pericardium, extracardiac: A prominent pericardial fat pad was present. Impressions:  - Unable to compare directly with prior study March 2014. Mild LVH with LVEF 60-65%, basal inferior hypokinesis, grade 1 diastolic dysfunction. Mild left atrial enlargement. MAC with mild mitral regurgitation. Device wire noted in the right heart. Mild tricuspid regurgitation. RV-RA gradient 38 mmHg, likely mildly increased  PASP assuming normal CVP. This is significantly improved compared with prior report.  Assessment and Plan:  1. Symptomatically stable CAD status post DES to the posterolateral in 2006. He does not report any active angina symptoms. We will continue observation.  2. Essential hypertension, blood pressure low normal today. In light of orthostatic dizziness we will cut his Cozaar down to 25 mg daily.  3. Hyperlipidemia, on statin therapy. He follows with Dr. Olena LeatherwoodHasanaj.  4. History of pulmonary hypertension, significant improvement noted by last echocardiogram with RV-RA gradient 38 mmHg.  5. Sick sinus syndrome status post Medtronic pacemaker, followed by Dr. Johney FrameAllred.  6. Recurrent pulmonary emboli status post IVC filter and on chronic Coumadin, followed by Dr. Olena LeatherwoodHasanaj.  Current medicines were reviewed with the patient today.  Disposition: FU with me in 6 months.   Signed, Jonelle SidleSamuel G. McDowell, MD, [facility] 02/14/2016 2:14 PM    [facility] [address], [place], [place] [address] Phone: [phone](336) [phone]; Fax: [phone](336) [phone]

## 2016-02-14 NOTE — Patient Instructions (Signed)
Your physician has recommended you make the following change in your medication:  Decrease losartan to 25 mg daily. You may break your 50 mg in half daily until they are finished. Continue all other medications the same. Your physician recommends that you schedule a follow-up appointment in: 6 months. You will receive a reminder letter in the mail in about 4 months reminding you to call and schedule your appointment. If you don't receive this letter, please contact our office.

## 2016-02-27 ENCOUNTER — Telehealth: Payer: Self-pay | Admitting: Cardiology

## 2016-02-27 ENCOUNTER — Encounter: Payer: Medicare HMO | Admitting: *Deleted

## 2016-02-27 DIAGNOSIS — Z7901 Long term (current) use of anticoagulants: Secondary | ICD-10-CM | POA: Diagnosis not present

## 2016-02-27 DIAGNOSIS — Z6837 Body mass index (BMI) 37.0-37.9, adult: Secondary | ICD-10-CM | POA: Diagnosis not present

## 2016-02-27 DIAGNOSIS — M545 Low back pain: Secondary | ICD-10-CM | POA: Diagnosis not present

## 2016-02-27 DIAGNOSIS — M62830 Muscle spasm of back: Secondary | ICD-10-CM | POA: Diagnosis not present

## 2016-02-27 NOTE — Telephone Encounter (Signed)
LMOVM reminding pt to send remote transmission.   

## 2016-03-05 ENCOUNTER — Encounter: Payer: Self-pay | Admitting: Cardiology

## 2016-03-21 DIAGNOSIS — Z7901 Long term (current) use of anticoagulants: Secondary | ICD-10-CM | POA: Diagnosis not present

## 2016-04-02 DIAGNOSIS — Z6836 Body mass index (BMI) 36.0-36.9, adult: Secondary | ICD-10-CM | POA: Diagnosis not present

## 2016-04-02 DIAGNOSIS — H8113 Benign paroxysmal vertigo, bilateral: Secondary | ICD-10-CM | POA: Diagnosis not present

## 2016-04-09 DIAGNOSIS — R0789 Other chest pain: Secondary | ICD-10-CM | POA: Diagnosis not present

## 2016-04-09 DIAGNOSIS — R079 Chest pain, unspecified: Secondary | ICD-10-CM | POA: Diagnosis not present

## 2016-04-09 DIAGNOSIS — Z86711 Personal history of pulmonary embolism: Secondary | ICD-10-CM | POA: Diagnosis not present

## 2016-04-09 DIAGNOSIS — E785 Hyperlipidemia, unspecified: Secondary | ICD-10-CM | POA: Diagnosis not present

## 2016-04-09 DIAGNOSIS — N183 Chronic kidney disease, stage 3 (moderate): Secondary | ICD-10-CM | POA: Diagnosis not present

## 2016-04-09 DIAGNOSIS — I6200 Nontraumatic subdural hemorrhage, unspecified: Secondary | ICD-10-CM | POA: Diagnosis not present

## 2016-04-09 DIAGNOSIS — E1122 Type 2 diabetes mellitus with diabetic chronic kidney disease: Secondary | ICD-10-CM | POA: Diagnosis not present

## 2016-04-09 DIAGNOSIS — I5032 Chronic diastolic (congestive) heart failure: Secondary | ICD-10-CM | POA: Diagnosis not present

## 2016-04-09 DIAGNOSIS — M109 Gout, unspecified: Secondary | ICD-10-CM | POA: Diagnosis not present

## 2016-04-09 DIAGNOSIS — R51 Headache: Secondary | ICD-10-CM | POA: Diagnosis not present

## 2016-04-09 DIAGNOSIS — K219 Gastro-esophageal reflux disease without esophagitis: Secondary | ICD-10-CM | POA: Diagnosis not present

## 2016-04-10 DIAGNOSIS — R0789 Other chest pain: Secondary | ICD-10-CM | POA: Diagnosis not present

## 2016-04-10 DIAGNOSIS — K219 Gastro-esophageal reflux disease without esophagitis: Secondary | ICD-10-CM | POA: Diagnosis not present

## 2016-04-10 DIAGNOSIS — E785 Hyperlipidemia, unspecified: Secondary | ICD-10-CM | POA: Diagnosis not present

## 2016-04-10 DIAGNOSIS — R079 Chest pain, unspecified: Secondary | ICD-10-CM | POA: Diagnosis not present

## 2016-04-10 DIAGNOSIS — Z86711 Personal history of pulmonary embolism: Secondary | ICD-10-CM | POA: Diagnosis not present

## 2016-04-10 DIAGNOSIS — E1122 Type 2 diabetes mellitus with diabetic chronic kidney disease: Secondary | ICD-10-CM | POA: Diagnosis not present

## 2016-04-10 DIAGNOSIS — M109 Gout, unspecified: Secondary | ICD-10-CM | POA: Diagnosis not present

## 2016-04-10 DIAGNOSIS — I6200 Nontraumatic subdural hemorrhage, unspecified: Secondary | ICD-10-CM | POA: Diagnosis not present

## 2016-04-10 DIAGNOSIS — N183 Chronic kidney disease, stage 3 (moderate): Secondary | ICD-10-CM | POA: Diagnosis not present

## 2016-04-10 DIAGNOSIS — I5032 Chronic diastolic (congestive) heart failure: Secondary | ICD-10-CM | POA: Diagnosis not present

## 2016-04-25 DIAGNOSIS — Z7901 Long term (current) use of anticoagulants: Secondary | ICD-10-CM | POA: Diagnosis not present

## 2016-05-08 DIAGNOSIS — I2782 Chronic pulmonary embolism: Secondary | ICD-10-CM | POA: Diagnosis not present

## 2016-05-08 DIAGNOSIS — I2609 Other pulmonary embolism with acute cor pulmonale: Secondary | ICD-10-CM | POA: Diagnosis not present

## 2016-05-08 DIAGNOSIS — E784 Other hyperlipidemia: Secondary | ICD-10-CM | POA: Diagnosis not present

## 2016-05-08 DIAGNOSIS — G40802 Other epilepsy, not intractable, without status epilepticus: Secondary | ICD-10-CM | POA: Diagnosis not present

## 2016-05-08 DIAGNOSIS — R0902 Hypoxemia: Secondary | ICD-10-CM | POA: Diagnosis not present

## 2016-05-08 DIAGNOSIS — E1165 Type 2 diabetes mellitus with hyperglycemia: Secondary | ICD-10-CM | POA: Diagnosis not present

## 2016-05-08 DIAGNOSIS — K210 Gastro-esophageal reflux disease with esophagitis: Secondary | ICD-10-CM | POA: Diagnosis not present

## 2016-05-08 DIAGNOSIS — I10 Essential (primary) hypertension: Secondary | ICD-10-CM | POA: Diagnosis not present

## 2016-05-08 DIAGNOSIS — Z6836 Body mass index (BMI) 36.0-36.9, adult: Secondary | ICD-10-CM | POA: Diagnosis not present

## 2016-05-08 DIAGNOSIS — I509 Heart failure, unspecified: Secondary | ICD-10-CM | POA: Diagnosis not present

## 2016-05-08 DIAGNOSIS — I5032 Chronic diastolic (congestive) heart failure: Secondary | ICD-10-CM | POA: Diagnosis not present

## 2016-05-30 DIAGNOSIS — I2782 Chronic pulmonary embolism: Secondary | ICD-10-CM | POA: Diagnosis not present

## 2016-06-07 DIAGNOSIS — I509 Heart failure, unspecified: Secondary | ICD-10-CM | POA: Diagnosis not present

## 2016-06-07 DIAGNOSIS — R0902 Hypoxemia: Secondary | ICD-10-CM | POA: Diagnosis not present

## 2016-06-07 DIAGNOSIS — I2609 Other pulmonary embolism with acute cor pulmonale: Secondary | ICD-10-CM | POA: Diagnosis not present

## 2016-06-13 DIAGNOSIS — I2782 Chronic pulmonary embolism: Secondary | ICD-10-CM | POA: Diagnosis not present

## 2016-07-08 DIAGNOSIS — I2609 Other pulmonary embolism with acute cor pulmonale: Secondary | ICD-10-CM | POA: Diagnosis not present

## 2016-07-08 DIAGNOSIS — R0902 Hypoxemia: Secondary | ICD-10-CM | POA: Diagnosis not present

## 2016-07-08 DIAGNOSIS — I509 Heart failure, unspecified: Secondary | ICD-10-CM | POA: Diagnosis not present

## 2016-07-18 DIAGNOSIS — I2782 Chronic pulmonary embolism: Secondary | ICD-10-CM | POA: Diagnosis not present

## 2016-08-08 DIAGNOSIS — I509 Heart failure, unspecified: Secondary | ICD-10-CM | POA: Diagnosis not present

## 2016-08-08 DIAGNOSIS — R0902 Hypoxemia: Secondary | ICD-10-CM | POA: Diagnosis not present

## 2016-08-08 DIAGNOSIS — I2609 Other pulmonary embolism with acute cor pulmonale: Secondary | ICD-10-CM | POA: Diagnosis not present

## 2016-08-14 ENCOUNTER — Ambulatory Visit: Payer: Medicare HMO | Admitting: Cardiology

## 2016-08-14 DIAGNOSIS — I10 Essential (primary) hypertension: Secondary | ICD-10-CM | POA: Diagnosis not present

## 2016-08-14 DIAGNOSIS — I2782 Chronic pulmonary embolism: Secondary | ICD-10-CM | POA: Diagnosis not present

## 2016-08-14 DIAGNOSIS — K210 Gastro-esophageal reflux disease with esophagitis: Secondary | ICD-10-CM | POA: Diagnosis not present

## 2016-08-14 DIAGNOSIS — E1165 Type 2 diabetes mellitus with hyperglycemia: Secondary | ICD-10-CM | POA: Diagnosis not present

## 2016-08-14 DIAGNOSIS — G40802 Other epilepsy, not intractable, without status epilepticus: Secondary | ICD-10-CM | POA: Diagnosis not present

## 2016-08-14 DIAGNOSIS — E1143 Type 2 diabetes mellitus with diabetic autonomic (poly)neuropathy: Secondary | ICD-10-CM | POA: Diagnosis not present

## 2016-08-14 DIAGNOSIS — Z6836 Body mass index (BMI) 36.0-36.9, adult: Secondary | ICD-10-CM | POA: Diagnosis not present

## 2016-08-14 DIAGNOSIS — I5032 Chronic diastolic (congestive) heart failure: Secondary | ICD-10-CM | POA: Diagnosis not present

## 2016-08-14 DIAGNOSIS — E784 Other hyperlipidemia: Secondary | ICD-10-CM | POA: Diagnosis not present

## 2016-08-27 ENCOUNTER — Encounter: Payer: Self-pay | Admitting: *Deleted

## 2016-08-27 NOTE — Progress Notes (Signed)
Cardiology Office Note  Date: 08/28/2016   ID: [name], DOB 06/24/1935, MRN [id number]  PCP: Toma Deiters, MD  Primary Cardiologist: Nona Dell, MD   Chief Complaint  Patient presents with  . Coronary Artery Disease    History of Present Illness: [name] is a 81 y.o. male last seen in March. He is here with his wife for a follow-up visit. He does not report any angina symptoms but states that he has had a six-week period of feeling very weak, dizzy and vertiginous when he stands up as if things are spinning. He feels better when he is lying down and his head is in a certain position. He states that he was seen in the ER and told that he had "inner ear problems."  At the last visit we cut Cozaar dose down to 25 mg daily due to relatively low blood pressure. Blood pressure today is higher. I reviewed his ECG which shows an atrial paced rhythm with low voltage in the lateral leads.  He continues to follow with Dr. Johney Frame in the device clinic with a history of sick sinus syndrome status post Medtronic pacemaker placement.  Coumadin follow-up continues to per Dr. Olena Leatherwood.  Past Medical History:  Diagnosis Date  . Carotid sinus syndrome    Status post pacemaker  . Coronary atherosclerosis of native coronary artery    DES to PL 2006, mild to moderate residual disease 2010   . Essential hypertension, benign   . Mixed hyperlipidemia   . Obstructive sleep apnea    CPAP  . Pacemaker 1986   Medtronic  . Pulmonary emboli [facility])    Diagnosed 2012 and 2014, IVC filter March 2014, recurrent March 2014 - thromboembolic therapy at [facility] and placed back on anticoagulation  . Pulmonary hypertension    Severe, RVSP 85-90 mmHg March 2014   . Stroke (HCC) 11/2012  . Subdural hematoma [facility])    January 2014, occurred on Coumadin  . Type 2 diabetes mellitus (HCC)     Past Surgical History:  Procedure Laterality Date  . arm surgery Right   . CATARACT EXTRACTION W/PHACO  Right 02/18/2014   Procedure: CATARACT EXTRACTION PHACO AND INTRAOCULAR LENS PLACEMENT (IOC);  Surgeon: Gemma Payor, MD;  Location: [facility];  Service: Ophthalmology;  Laterality: Right;  CDE: 11.83  . CATARACT EXTRACTION W/PHACO Left 03/25/2014   Procedure: CATARACT EXTRACTION PHACO AND INTRAOCULAR LENS PLACEMENT (IOC);  Surgeon: Gemma Payor, MD;  Location: [facility];  Service: Ophthalmology;  Laterality: Left;  CDE 15.93  . CHOLECYSTECTOMY  2011  . EP IMPLANTABLE DEVICE N/A 07/26/2015   MDT Adapta L gen change by Dr Johney Frame  . FILTERING PROCEDURE  11/2012   placed due to blood clots from the lungs  . PACEMAKER IMPLANTED   07/16/2005   Medtronic Kappa 900 DR    Current Outpatient Prescriptions  Medication Sig Dispense Refill  . allopurinol (ZYLOPRIM) 300 MG tablet Take 300 mg by mouth daily after breakfast.     . aspirin EC 81 MG tablet Take 81 mg by mouth daily after breakfast.    . atorvastatin (LIPITOR) 40 MG tablet Take 40 mg by mouth daily after breakfast.     . carvedilol (COREG) 6.25 MG tablet Take 1 tablet (6.25 mg total) by mouth 2 (two) times daily with a meal. 180 tablet 3  . citalopram (CELEXA) 20 MG tablet Take 20 mg by mouth daily.  0  . colchicine 0.6 MG tablet Take 0.6 mg  by mouth daily as needed (gout attacks).     . fenofibrate (TRICOR) 145 MG tablet Take 145 mg by mouth daily.      . ferrous sulfate 325 (65 FE) MG tablet Take 325 mg by mouth 3 (three) times daily with meals.    . furosemide (LASIX) 20 MG tablet Take 20 mg by mouth daily.     Marland Kitchen gemfibrozil (LOPID) 600 MG tablet Take 600 mg by mouth daily.    Marland Kitchen glimepiride (AMARYL) 2 MG tablet Take 2 mg by mouth 2 (two) times daily.     Marland Kitchen levETIRAcetam (KEPPRA) 750 MG tablet Take 750 mg by mouth 2 (two) times daily.     Marland Kitchen losartan (COZAAR) 25 MG tablet Take 1 tablet (25 mg total) by mouth daily. 90 tablet 3  . metFORMIN (GLUCOPHAGE) 1000 MG tablet Take 1,000 mg by mouth 2 (two) times daily with a meal.    . Omega-3 Fatty Acids (FISH  OIL) 1000 MG CAPS Take 1 capsule by mouth 3 (three) times daily.    Marland Kitchen omeprazole (PRILOSEC) 40 MG capsule Take 40 mg by mouth daily.      . sitaGLIPtin (JANUVIA) 100 MG tablet Take 100 mg by mouth daily.    Marland Kitchen warfarin (COUMADIN) 3 MG tablet Take 3 mg by mouth every other day. Alternate with 4 mg tablet (daily after breakfast) or as directed by Dr. Jeronimo Greaves    . warfarin (COUMADIN) 4 MG tablet Take 4 mg by mouth every other day. Alternate with 3 mg tablet (daily after breakfast) or as directed by Dr. Jeronimo Greaves     No current facility-administered medications for this visit.    Allergies:  Nitroglycerin   Social History: The patient  reports that he has never smoked. He has never used smokeless tobacco. He reports that he does not drink alcohol or use drugs.   ROS:  Please see the history of present illness. Otherwise, complete review of systems is positive for weakness.  All other systems are reviewed and negative.   Physical Exam: VS:  BP (!) 120/92   Pulse 78   Ht 6\' 1"  (1.854 m)   Wt 278 lb (126.1 kg)   SpO2 95%   BMI 36.68 kg/m , BMI Body mass index is 36.68 kg/m.  Wt Readings from Last 3 Encounters:  08/28/16 278 lb (126.1 kg)  02/14/16 295 lb 9.6 oz (134.1 kg)  11/25/15 286 lb (129.7 kg)    Morbidly obese male, appears comfortable at rest. HEENT: Conjunctiva and lids normal, oropharynx clear.  Neck: Supple, increased girth with difficult to assess JVP, no carotid bruits, no thyromegaly.  Lungs: Clear to auscultation, nonlabored breathing at rest.  Thorax: Left sided device can site stable, prominent. Cardiac: Regular rate and rhythm, no S3, soft apical systolic murmur, no pericardial rub.  Abdomen: Soft, nontender, protuberant, bowel sounds present.  Extremities: Chronic appearing edema, distal pulses 1-2+. Skin: Warm and dry. Musculoskeletal: No kyphosis. Neuropsychiatric: Alert and oriented 3, affect appropriate.  ECG: I personally reviewed the tracing from 07/26/2015  which showed sinus rhythm.  Recent Labwork:  September 2016: BUN 17; Creatinine, Ser 1.22; Hemoglobin 14.6; Platelets 209; Potassium 4.0; Sodium 140   Other Studies Reviewed Today:  Echocardiogram 12/17/2013: Study Conclusions  - Left ventricle: The cavity size was normal. Wall thickness was increased in a pattern of mild LVH. Systolic function was normal. The estimated ejection fraction was in the range of 60% to 65%. There is hypokinesis of the basalinferior myocardium. Doppler parameters  are consistent with abnormal left ventricular relaxation (grade 1 diastolic dysfunction). - Aortic valve: Mildly calcified annulus. Trileaflet; mildly thickened leaflets. Trivial regurgitation. - Mitral valve: Calcified annulus. Mildly thickened leaflets . Mild regurgitation directed eccentrically. - Left atrium: The atrium was mildly dilated. The appendage was mildly to moderately dilated. - Right ventricle: Pacer wire or catheter noted in right ventricle. - Tricuspid valve: Mild regurgitation. Peak RV-RA gradient: 38mm Hg (S). - Pulmonary arteries: Systolic pressure could not be accurately estimated. Likely mildly increased based on RV-RA gradient, assuming normal CVP. - Inferior vena cava: Not visualized. Unable toestimate CVP. - Pericardium, extracardiac: A prominent pericardial fat pad was present. Impressions:  - Unable to compare directly with prior study March 2014. Mild LVH with LVEF 60-65%, basal inferior hypokinesis, grade 1 diastolic dysfunction. Mild left atrial enlargement. MAC with mild mitral regurgitation. Device wire noted in the right heart. Mild tricuspid regurgitation. RV-RA gradient 38 mmHg, likely mildly increased PASP assuming normal CVP. This is significantly improved compared with prior report.  Assessment and Plan:  1. Weakness, vertigo and lightheadedness as outlined above. Patient states that he was told he had an  "inner ear problem" after ER visit. He does describe a definite spinning sensation, when he moves his head or sits up. Blood pressure is actually somewhat higher than it was at last visit. Could try as needed meclizine, although may also need a physical therapy evaluation. I recommended that he contact Dr. Olena LeatherwoodHasanaj for an office visit.  2. Sick sinus syndrome status post Medtronic pacemaker placement, device function has been stable based on chart review. ECG shows atrial pacing today.  3. CAD status post DES to the posterolateral in 2006. No obvious angina symptoms on medical therapy and LVEF normal by last echocardiogram in 2015.  4. Prior history of pulmonary emboli, continues on long-term Coumadin per Dr. Olena LeatherwoodHasanaj.  Current medicines were reviewed with the patient today.   Orders Placed This Encounter  Procedures  . EKG 12-Lead    Disposition: Follow-up with me in 6 months.  Signed, Jonelle SidleSamuel G. Erwin Nishiyama, MD, [facility]ACC 08/28/2016 11:58 AM    [facility] [address], [place], [place] [address] Phone: [phone](336) [phone]; Fax: [phone](336) [phone]

## 2016-08-28 ENCOUNTER — Ambulatory Visit (INDEPENDENT_AMBULATORY_CARE_PROVIDER_SITE_OTHER): Payer: Commercial Managed Care - HMO | Admitting: Cardiology

## 2016-08-28 ENCOUNTER — Encounter: Payer: Self-pay | Admitting: Cardiology

## 2016-08-28 VITALS — BP 120/92 | HR 78 | Ht 73.0 in | Wt 278.0 lb

## 2016-08-28 DIAGNOSIS — I2510 Atherosclerotic heart disease of native coronary artery without angina pectoris: Secondary | ICD-10-CM | POA: Diagnosis not present

## 2016-08-28 DIAGNOSIS — R42 Dizziness and giddiness: Secondary | ICD-10-CM | POA: Diagnosis not present

## 2016-08-28 DIAGNOSIS — I2699 Other pulmonary embolism without acute cor pulmonale: Secondary | ICD-10-CM | POA: Diagnosis not present

## 2016-08-28 DIAGNOSIS — I495 Sick sinus syndrome: Secondary | ICD-10-CM | POA: Diagnosis not present

## 2016-08-28 NOTE — Patient Instructions (Signed)

## 2016-09-07 DIAGNOSIS — I509 Heart failure, unspecified: Secondary | ICD-10-CM | POA: Diagnosis not present

## 2016-09-07 DIAGNOSIS — I2609 Other pulmonary embolism with acute cor pulmonale: Secondary | ICD-10-CM | POA: Diagnosis not present

## 2016-09-07 DIAGNOSIS — R0902 Hypoxemia: Secondary | ICD-10-CM | POA: Diagnosis not present

## 2016-09-19 DIAGNOSIS — I2782 Chronic pulmonary embolism: Secondary | ICD-10-CM | POA: Diagnosis not present

## 2016-09-28 DIAGNOSIS — I2782 Chronic pulmonary embolism: Secondary | ICD-10-CM | POA: Diagnosis not present

## 2016-10-06 DIAGNOSIS — R55 Syncope and collapse: Secondary | ICD-10-CM | POA: Diagnosis not present

## 2016-10-06 DIAGNOSIS — E860 Dehydration: Secondary | ICD-10-CM | POA: Diagnosis not present

## 2016-10-06 DIAGNOSIS — G464 Cerebellar stroke syndrome: Secondary | ICD-10-CM | POA: Diagnosis not present

## 2016-10-06 DIAGNOSIS — N179 Acute kidney failure, unspecified: Secondary | ICD-10-CM | POA: Diagnosis not present

## 2016-10-06 DIAGNOSIS — R5600 Simple febrile convulsions: Secondary | ICD-10-CM | POA: Diagnosis not present

## 2016-10-06 DIAGNOSIS — Z86711 Personal history of pulmonary embolism: Secondary | ICD-10-CM | POA: Diagnosis not present

## 2016-10-06 DIAGNOSIS — J9811 Atelectasis: Secondary | ICD-10-CM | POA: Diagnosis not present

## 2016-10-06 DIAGNOSIS — R4182 Altered mental status, unspecified: Secondary | ICD-10-CM | POA: Diagnosis not present

## 2016-10-06 DIAGNOSIS — R569 Unspecified convulsions: Secondary | ICD-10-CM | POA: Diagnosis not present

## 2016-10-06 DIAGNOSIS — E1122 Type 2 diabetes mellitus with diabetic chronic kidney disease: Secondary | ICD-10-CM | POA: Diagnosis not present

## 2016-10-06 DIAGNOSIS — R1310 Dysphagia, unspecified: Secondary | ICD-10-CM | POA: Diagnosis not present

## 2016-10-06 DIAGNOSIS — I6789 Other cerebrovascular disease: Secondary | ICD-10-CM | POA: Diagnosis not present

## 2016-10-06 DIAGNOSIS — Z6835 Body mass index (BMI) 35.0-35.9, adult: Secondary | ICD-10-CM | POA: Diagnosis not present

## 2016-10-06 DIAGNOSIS — K219 Gastro-esophageal reflux disease without esophagitis: Secondary | ICD-10-CM | POA: Diagnosis not present

## 2016-10-06 DIAGNOSIS — I5032 Chronic diastolic (congestive) heart failure: Secondary | ICD-10-CM | POA: Diagnosis not present

## 2016-10-06 DIAGNOSIS — N178 Other acute kidney failure: Secondary | ICD-10-CM | POA: Diagnosis not present

## 2016-10-16 DIAGNOSIS — G40804 Other epilepsy, intractable, without status epilepticus: Secondary | ICD-10-CM | POA: Diagnosis not present

## 2016-10-16 DIAGNOSIS — Z6835 Body mass index (BMI) 35.0-35.9, adult: Secondary | ICD-10-CM | POA: Diagnosis not present

## 2016-11-16 DIAGNOSIS — Z1389 Encounter for screening for other disorder: Secondary | ICD-10-CM | POA: Diagnosis not present

## 2016-11-16 DIAGNOSIS — Z0000 Encounter for general adult medical examination without abnormal findings: Secondary | ICD-10-CM | POA: Diagnosis not present

## 2016-11-16 DIAGNOSIS — E1143 Type 2 diabetes mellitus with diabetic autonomic (poly)neuropathy: Secondary | ICD-10-CM | POA: Diagnosis not present

## 2016-11-16 DIAGNOSIS — G40804 Other epilepsy, intractable, without status epilepticus: Secondary | ICD-10-CM | POA: Diagnosis not present

## 2016-11-16 DIAGNOSIS — I10 Essential (primary) hypertension: Secondary | ICD-10-CM | POA: Diagnosis not present

## 2016-11-16 DIAGNOSIS — Z6835 Body mass index (BMI) 35.0-35.9, adult: Secondary | ICD-10-CM | POA: Diagnosis not present

## 2016-11-23 ENCOUNTER — Telehealth: Payer: Self-pay | Admitting: *Deleted

## 2016-11-23 ENCOUNTER — Ambulatory Visit (INDEPENDENT_AMBULATORY_CARE_PROVIDER_SITE_OTHER): Payer: Medicare HMO | Admitting: Internal Medicine

## 2016-11-23 ENCOUNTER — Encounter: Payer: Self-pay | Admitting: *Deleted

## 2016-11-23 ENCOUNTER — Encounter: Payer: Self-pay | Admitting: Internal Medicine

## 2016-11-23 VITALS — BP 142/89 | HR 73 | Ht 73.0 in | Wt 269.0 lb

## 2016-11-23 DIAGNOSIS — I10 Essential (primary) hypertension: Secondary | ICD-10-CM

## 2016-11-23 DIAGNOSIS — I495 Sick sinus syndrome: Secondary | ICD-10-CM | POA: Diagnosis not present

## 2016-11-23 DIAGNOSIS — Z950 Presence of cardiac pacemaker: Secondary | ICD-10-CM | POA: Diagnosis not present

## 2016-11-23 DIAGNOSIS — I471 Supraventricular tachycardia: Secondary | ICD-10-CM

## 2016-11-23 LAB — CUP PACEART INCLINIC DEVICE CHECK
Battery Remaining Longevity: 144 mo
Brady Statistic AP VP Percent: 11 %
Brady Statistic AS VP Percent: 13 %
Brady Statistic AS VS Percent: 56 %
Implantable Lead Location: 753859
Implantable Lead Model: 5076
Implantable Pulse Generator Implant Date: 20160906
Lead Channel Impedance Value: 489 Ohm
Lead Channel Pacing Threshold Amplitude: 1 V
Lead Channel Pacing Threshold Pulse Width: 0.4 ms
Lead Channel Pacing Threshold Pulse Width: 0.4 ms
Lead Channel Pacing Threshold Pulse Width: 0.4 ms
Lead Channel Pacing Threshold Pulse Width: 0.4 ms
Lead Channel Setting Pacing Amplitude: 2 V
Lead Channel Setting Sensing Sensitivity: 2 mV
MDC IDC LEAD IMPLANT DT: 20060828
MDC IDC LEAD IMPLANT DT: 20060828
MDC IDC LEAD LOCATION: 753860
MDC IDC MSMT BATTERY IMPEDANCE: 111 Ohm
MDC IDC MSMT BATTERY VOLTAGE: 2.79 V
MDC IDC MSMT LEADCHNL RA IMPEDANCE VALUE: 407 Ohm
MDC IDC MSMT LEADCHNL RA PACING THRESHOLD AMPLITUDE: 1 V
MDC IDC MSMT LEADCHNL RA SENSING INTR AMPL: 4 mV
MDC IDC MSMT LEADCHNL RV PACING THRESHOLD AMPLITUDE: 1.25 V
MDC IDC MSMT LEADCHNL RV PACING THRESHOLD AMPLITUDE: 1.25 V
MDC IDC MSMT LEADCHNL RV SENSING INTR AMPL: 5.6 mV
MDC IDC SESS DTM: 20180105122933
MDC IDC SET LEADCHNL RV PACING AMPLITUDE: 2.5 V
MDC IDC SET LEADCHNL RV PACING PULSEWIDTH: 0.4 ms
MDC IDC STAT BRADY AP VS PERCENT: 19 %

## 2016-11-23 NOTE — Telephone Encounter (Signed)
LMOM to call back to device clinic.  Need SN from [id number] monitor to [facility]

## 2016-11-23 NOTE — Patient Instructions (Signed)
Medication Instructions:  Continue all current medications.  Labwork: none  Testing/Procedures: none  Follow-Up: Your physician wants you to follow up in:  1 year.  You will receive a reminder letter in the mail one-two months in advance.  If you don't receive a letter, please call our office to schedule the follow up appointment   Any Other Special Instructions Will Be Listed Below (If Applicable). Remote monitoring is used to monitor your Pacemaker of ICD from home. This monitoring reduces the number of office visits required to check your device to one time per year. It allows us to keep an eye on the functioning of your device to ensure it is working properly. You are scheduled for a device check from home on 02/25/2017.  You may send your transmission at any time that day. If you have a wireless device, the transmission will be sent automatically. After your physician reviews your transmission, you will receive a postcard with your next transmission date.  If you need a refill on your cardiac medications before your next appointment, please call your pharmacy.  

## 2016-11-23 NOTE — Progress Notes (Signed)
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 male who presents today for electrophysiology followup.  Doing reasonably well at this time.  He struggles chronic fatigue and debilitation.  He had vertigo in the fall which has since improved.   He appears to have been chronically SOB.  + rare palpitations. Today, he denies symptoms of chest pain,  lower extremity edema,  presyncope, or syncope.  The patient is otherwise without complaint today.   Past Medical History:  Diagnosis Date  . Carotid sinus syndrome    Status post pacemaker  . Coronary atherosclerosis of native coronary artery    DES to PL 2006, mild to moderate residual disease 2010   . Essential hypertension, benign   . Mixed hyperlipidemia   . Obstructive sleep apnea    CPAP  . Pacemaker 1986   Medtronic  . Pulmonary emboli [facility])    Diagnosed 2012 and 2014, IVC filter March 2014, recurrent March 2014 - thromboembolic therapy at [facility] and placed back on anticoagulation  . Pulmonary hypertension    Severe, RVSP 85-90 mmHg March 2014   . Stroke (HCC) 11/2012  . Subdural hematoma [facility])    January 2014, occurred on Coumadin  . Type 2 diabetes mellitus (HCC)    Past Surgical History:  Procedure Laterality Date  . arm surgery Right   . CATARACT EXTRACTION W/PHACO Right 02/18/2014   Procedure: CATARACT EXTRACTION PHACO AND INTRAOCULAR LENS PLACEMENT (IOC);  Surgeon: Gemma Payor, MD;  Location: [facility]S;  Service: Ophthalmology;  Laterality: Right;  CDE: 11.83  . CATARACT EXTRACTION W/PHACO Left 03/25/2014   Procedure: CATARACT EXTRACTION PHACO AND INTRAOCULAR LENS PLACEMENT (IOC);  Surgeon: Gemma Payor, MD;  Location: [facility] ORS;  Service: Ophthalmology;  Laterality: Left;  CDE 15.93  . CHOLECYSTECTOMY  2011  . EP IMPLANTABLE DEVICE N/A 07/26/2015   MDT Adapta L gen change by Dr Johney Frame  . FILTERING PROCEDURE  11/2012   placed due to blood clots from the lungs  . PACEMAKER IMPLANTED   07/16/2005   Medtronic Kappa 900 DR    Current Outpatient Prescriptions  Medication Sig Dispense Refill  . allopurinol (ZYLOPRIM) 300 MG tablet Take 300 mg by mouth daily after breakfast.     . aspirin EC 81 MG tablet Take 81 mg by mouth daily after breakfast.    . atorvastatin (LIPITOR) 40 MG tablet Take 40 mg by mouth daily after breakfast.     . carvedilol (COREG) 6.25 MG tablet Take 1 tablet (6.25 mg total) by mouth 2 (two) times daily with a meal. 180 tablet 3  . citalopram (CELEXA) 20 MG tablet Take 20 mg by mouth daily.  0  . colchicine 0.6 MG tablet Take 0.6 mg by mouth daily as needed (gout attacks).     . fenofibrate (TRICOR) 145 MG tablet Take 145 mg by mouth daily.      . ferrous sulfate 325 (65 FE) MG tablet Take 325 mg by mouth 3 (three) times daily with meals.    . furosemide (LASIX) 20 MG tablet Take 20 mg by mouth daily.     Marland Kitchen gemfibrozil (LOPID) 600 MG tablet Take 600 mg by mouth daily.    Marland Kitchen glimepiride (AMARYL) 2 MG tablet Take 2 mg by mouth 2 (two) times daily.     Marland Kitchen levETIRAcetam (KEPPRA) 750 MG tablet Take 750 mg by mouth 2 (two) times daily.     Marland Kitchen losartan (COZAAR) 25 MG tablet  Take 1 tablet (25 mg total) by mouth daily. 90 tablet 3  . metFORMIN (GLUCOPHAGE) 1000 MG tablet Take 1,000 mg by mouth 2 (two) times daily with a meal.    . Omega-3 Fatty Acids (FISH OIL) 1000 MG CAPS Take 1 capsule by mouth 3 (three) times daily.    Marland Kitchen. omeprazole (PRILOSEC) 40 MG capsule Take 40 mg by mouth daily.      Marland Kitchen. warfarin (COUMADIN) 3 MG tablet Take 3 mg by mouth every other day. Alternate with 4 mg tablet (daily after breakfast) or as directed by Dr. Jeronimo GreavesHasana    . warfarin (COUMADIN) 4 MG tablet Take 4 mg by mouth every other day. Alternate with 3 mg tablet (daily after breakfast) or as directed by Dr. Jeronimo GreavesHasana     No current facility-administered medications for this visit.     Physical Exam: Vitals:   11/23/16 1002  BP: (!) 142/89  Pulse: 73  SpO2: 95%  Weight: 269 lb (122 kg)  Height: 6\' 1"   (1.854 m)    GEN- The patient is overweight appearing, alert and oriented x 3 today.   Head- normocephalic, atraumatic Eyes-  Sclera clear, conjunctiva pink Ears- hearing intact Oropharynx- clear Lungs- Clear to ausculation bilaterally, normal work of breathing Chest- pacemaker pocket is well healed Heart- Regular rate and rhythm, no murmurs, rubs or gallops, PMI not laterally displaced GI- soft, NT, ND, + BS Extremities- no clubbing, cyanosis, or edema  Pacemaker interrogation- reviewed in detail today,  See PACEART report ekg from 10/17 reviewed Echo 2015 reviewed  Assessment and Plan:  1. Sick sinus syndrome Normal pacemaker function See Pace Art report No changes today  2. Prior PTE Continue long term anticoagulation with coumadin  3. Obesity Weight loss and regular exercise encouraged  4. SVT Pacemaker reveals occasional SVT (appears to be atach) though overall burden is <.1%.  I would therefore not advise any change in therapy at this time.  Will follow through remote monitoring  carelink Return to see me in 1 year   Hillis RangeJames Aireal Slater MD, [facility]ACC 11/23/2016 10:39 AM

## 2016-11-26 NOTE — Telephone Encounter (Signed)
Ms. Vedia Pereyraerdue gave me her husbands monitor serial number. Corrected in Carelink.

## 2016-12-01 ENCOUNTER — Other Ambulatory Visit: Payer: Self-pay | Admitting: Cardiology

## 2016-12-12 DIAGNOSIS — Z7901 Long term (current) use of anticoagulants: Secondary | ICD-10-CM | POA: Diagnosis not present

## 2016-12-26 DIAGNOSIS — Z79899 Other long term (current) drug therapy: Secondary | ICD-10-CM | POA: Diagnosis not present

## 2017-01-09 DIAGNOSIS — Z7901 Long term (current) use of anticoagulants: Secondary | ICD-10-CM | POA: Diagnosis not present

## 2017-02-01 DIAGNOSIS — E785 Hyperlipidemia, unspecified: Secondary | ICD-10-CM | POA: Diagnosis not present

## 2017-02-01 DIAGNOSIS — R55 Syncope and collapse: Secondary | ICD-10-CM | POA: Diagnosis not present

## 2017-02-01 DIAGNOSIS — K219 Gastro-esophageal reflux disease without esophagitis: Secondary | ICD-10-CM | POA: Diagnosis not present

## 2017-02-01 DIAGNOSIS — T1490XA Injury, unspecified, initial encounter: Secondary | ICD-10-CM | POA: Diagnosis not present

## 2017-02-01 DIAGNOSIS — I959 Hypotension, unspecified: Secondary | ICD-10-CM | POA: Diagnosis not present

## 2017-02-01 DIAGNOSIS — Z8679 Personal history of other diseases of the circulatory system: Secondary | ICD-10-CM | POA: Diagnosis not present

## 2017-02-01 DIAGNOSIS — I5030 Unspecified diastolic (congestive) heart failure: Secondary | ICD-10-CM | POA: Diagnosis not present

## 2017-02-01 DIAGNOSIS — G40802 Other epilepsy, not intractable, without status epilepticus: Secondary | ICD-10-CM | POA: Diagnosis not present

## 2017-02-01 DIAGNOSIS — E860 Dehydration: Secondary | ICD-10-CM | POA: Diagnosis not present

## 2017-02-01 DIAGNOSIS — Z86711 Personal history of pulmonary embolism: Secondary | ICD-10-CM | POA: Diagnosis not present

## 2017-02-01 DIAGNOSIS — N183 Chronic kidney disease, stage 3 (moderate): Secondary | ICD-10-CM | POA: Diagnosis not present

## 2017-02-01 DIAGNOSIS — R569 Unspecified convulsions: Secondary | ICD-10-CM | POA: Diagnosis not present

## 2017-02-01 DIAGNOSIS — E1122 Type 2 diabetes mellitus with diabetic chronic kidney disease: Secondary | ICD-10-CM | POA: Diagnosis not present

## 2017-02-01 DIAGNOSIS — R42 Dizziness and giddiness: Secondary | ICD-10-CM | POA: Diagnosis not present

## 2017-02-01 DIAGNOSIS — G40909 Epilepsy, unspecified, not intractable, without status epilepticus: Secondary | ICD-10-CM | POA: Diagnosis not present

## 2017-02-01 DIAGNOSIS — R4182 Altered mental status, unspecified: Secondary | ICD-10-CM | POA: Diagnosis not present

## 2017-02-02 DIAGNOSIS — Z86711 Personal history of pulmonary embolism: Secondary | ICD-10-CM | POA: Diagnosis not present

## 2017-02-02 DIAGNOSIS — K219 Gastro-esophageal reflux disease without esophagitis: Secondary | ICD-10-CM | POA: Diagnosis not present

## 2017-02-02 DIAGNOSIS — R55 Syncope and collapse: Secondary | ICD-10-CM | POA: Diagnosis not present

## 2017-02-02 DIAGNOSIS — G40802 Other epilepsy, not intractable, without status epilepticus: Secondary | ICD-10-CM | POA: Diagnosis not present

## 2017-02-02 DIAGNOSIS — E1122 Type 2 diabetes mellitus with diabetic chronic kidney disease: Secondary | ICD-10-CM | POA: Diagnosis not present

## 2017-02-03 DIAGNOSIS — Z86711 Personal history of pulmonary embolism: Secondary | ICD-10-CM | POA: Diagnosis not present

## 2017-02-03 DIAGNOSIS — R55 Syncope and collapse: Secondary | ICD-10-CM | POA: Diagnosis not present

## 2017-02-03 DIAGNOSIS — E1122 Type 2 diabetes mellitus with diabetic chronic kidney disease: Secondary | ICD-10-CM | POA: Diagnosis not present

## 2017-02-03 DIAGNOSIS — G40802 Other epilepsy, not intractable, without status epilepticus: Secondary | ICD-10-CM | POA: Diagnosis not present

## 2017-02-03 DIAGNOSIS — K219 Gastro-esophageal reflux disease without esophagitis: Secondary | ICD-10-CM | POA: Diagnosis not present

## 2017-02-13 DIAGNOSIS — R55 Syncope and collapse: Secondary | ICD-10-CM | POA: Diagnosis not present

## 2017-02-13 DIAGNOSIS — Z6835 Body mass index (BMI) 35.0-35.9, adult: Secondary | ICD-10-CM | POA: Diagnosis not present

## 2017-02-22 ENCOUNTER — Encounter: Payer: Self-pay | Admitting: *Deleted

## 2017-02-25 ENCOUNTER — Ambulatory Visit (INDEPENDENT_AMBULATORY_CARE_PROVIDER_SITE_OTHER): Payer: Medicare HMO | Admitting: *Deleted

## 2017-02-25 ENCOUNTER — Telehealth: Payer: Self-pay | Admitting: Cardiology

## 2017-02-25 ENCOUNTER — Ambulatory Visit (INDEPENDENT_AMBULATORY_CARE_PROVIDER_SITE_OTHER): Payer: Medicare HMO | Admitting: Cardiology

## 2017-02-25 ENCOUNTER — Encounter: Payer: Self-pay | Admitting: Cardiology

## 2017-02-25 VITALS — BP 113/73 | HR 77 | Ht 73.0 in | Wt 265.2 lb

## 2017-02-25 DIAGNOSIS — R42 Dizziness and giddiness: Secondary | ICD-10-CM | POA: Diagnosis not present

## 2017-02-25 DIAGNOSIS — I495 Sick sinus syndrome: Secondary | ICD-10-CM | POA: Diagnosis not present

## 2017-02-25 DIAGNOSIS — I2510 Atherosclerotic heart disease of native coronary artery without angina pectoris: Secondary | ICD-10-CM

## 2017-02-25 DIAGNOSIS — I2699 Other pulmonary embolism without acute cor pulmonale: Secondary | ICD-10-CM

## 2017-02-25 DIAGNOSIS — I471 Supraventricular tachycardia: Secondary | ICD-10-CM | POA: Diagnosis not present

## 2017-02-25 NOTE — Patient Instructions (Signed)

## 2017-02-25 NOTE — Progress Notes (Signed)
Cardiology Office Note  Date: 02/25/2017   ID: [name], DOB June 13, 1935, MRN [id number]  PCP: Neale Burly, MD  Primary Cardiologist: Rozann Lesches, MD   Chief Complaint  Patient presents with  . Coronary Artery Disease    History of Present Illness: [name] is a 82 y.o. male last seen in October 2017. He presents for a routine follow-up visit. States that about 3 or 4 weeks ago he was observed at [facility] after an episode of near-syncope and fall at home. This occurred after standing up. States that he was told that he was somewhat "dehydrated." He was treated with fluids, no other medication changes are noted. He states that he feels well at this time.  He continues to follow in the device clinic with Dr. Rayann Heman, Medtronic pacemaker in place. He has had some episodes of breakthrough SVT associated with symptoms including palpitations and lightheadedness.  He is on chronic Coumadin with history of pulmonary emboli, follows with Dr. Sherrie Sport.  Past Medical History:  Diagnosis Date  . Carotid sinus syndrome    Status post pacemaker  . Coronary atherosclerosis of native coronary artery    DES to PL 2006, mild to moderate residual disease 2010   . Essential hypertension, benign   . Mixed hyperlipidemia   . Obstructive sleep apnea    CPAP  . Pacemaker 1986   Medtronic  . Pulmonary emboli [facility])    Diagnosed 2012 and 2014, IVC filter March 2014, recurrent March 0093 - thromboembolic therapy at [facility] and placed back on anticoagulation  . Pulmonary hypertension    Severe, RVSP 85-90 mmHg March 2014   . Stroke ([place]) 11/2012  . Subdural hematoma [facility])    January 2014, occurred on Coumadin  . Type 2 diabetes mellitus ([place])     Past Surgical History:  Procedure Laterality Date  . arm surgery Right   . CATARACT EXTRACTION W/PHACO Right 02/18/2014   Procedure: CATARACT EXTRACTION PHACO AND INTRAOCULAR LENS PLACEMENT (IOC);  Surgeon: Tonny Branch, MD;  Location: [facility];   Service: Ophthalmology;  Laterality: Right;  CDE: 11.83  . CATARACT EXTRACTION W/PHACO Left 03/25/2014   Procedure: CATARACT EXTRACTION PHACO AND INTRAOCULAR LENS PLACEMENT (IOC);  Surgeon: Tonny Branch, MD;  Location: [facility];  Service: Ophthalmology;  Laterality: Left;  CDE 15.93  . CHOLECYSTECTOMY  2011  . EP IMPLANTABLE DEVICE N/A 07/26/2015   MDT Adapta L gen change by Dr Rayann Heman  . FILTERING PROCEDURE  11/2012   placed due to blood clots from the lungs  . PACEMAKER IMPLANTED   07/16/2005   Medtronic Kappa 900 DR    Current Outpatient Prescriptions  Medication Sig Dispense Refill  . allopurinol (ZYLOPRIM) 300 MG tablet Take 300 mg by mouth daily after breakfast.     . aspirin EC 81 MG tablet Take 81 mg by mouth daily after breakfast.    . atorvastatin (LIPITOR) 40 MG tablet Take 40 mg by mouth daily after breakfast.     . carvedilol (COREG) 6.25 MG tablet Take 1 tablet (6.25 mg total) by mouth 2 (two) times daily with a meal. 180 tablet 3  . citalopram (CELEXA) 20 MG tablet Take 20 mg by mouth daily.  0  . colchicine 0.6 MG tablet Take 0.6 mg by mouth daily as needed (gout attacks).     . fenofibrate (TRICOR) 145 MG tablet Take 145 mg by mouth daily.      . ferrous sulfate 325 (65 FE) MG tablet Take 325  mg by mouth 3 (three) times daily with meals.    . furosemide (LASIX) 20 MG tablet Take 20 mg by mouth daily.     Marland Kitchen gemfibrozil (LOPID) 600 MG tablet Take 600 mg by mouth daily.    Marland Kitchen glimepiride (AMARYL) 2 MG tablet Take 2 mg by mouth 2 (two) times daily.     Marland Kitchen levETIRAcetam (KEPPRA) 750 MG tablet Take 750 mg by mouth 2 (two) times daily.     Marland Kitchen losartan (COZAAR) 25 MG tablet TAKE 1 TABLET EVERY DAY  (DOSE  DECREASE) 90 tablet 3  . metFORMIN (GLUCOPHAGE) 1000 MG tablet Take 1,000 mg by mouth 2 (two) times daily with a meal.    . Omega-3 Fatty Acids (FISH OIL) 1000 MG CAPS Take 1 capsule by mouth 3 (three) times daily.    Marland Kitchen omeprazole (PRILOSEC) 40 MG capsule Take 40 mg by mouth daily.      Marland Kitchen  warfarin (COUMADIN) 3 MG tablet Take 3 mg by mouth every other day. Alternate with 4 mg tablet (daily after breakfast) or as directed by Dr. Natasha Bence    . warfarin (COUMADIN) 4 MG tablet Take 4 mg by mouth every other day. Alternate with 3 mg tablet (daily after breakfast) or as directed by Dr. Natasha Bence     No current facility-administered medications for this visit.    Allergies:  Nitroglycerin   Social History: The patient  reports that he has never smoked. He has never used smokeless tobacco. He reports that he does not drink alcohol or use drugs.   ROS:  Please see the history of present illness. Otherwise, complete review of systems is positive for arthritic stiffness and pain.  All other systems are reviewed and negative.   Physical Exam: VS:  BP 113/73   Pulse 77   Ht _0  (1.854 m)   Wt 265 lb 3.2 oz (120.3 kg)   SpO2 95%   BMI 34.99 kg/m , BMI Body mass index is 34.99 kg/m.  Wt Readings from Last 3 Encounters:  02/25/17 265 lb 3.2 oz (120.3 kg)  11/23/16 269 lb (122 kg)  08/28/16 278 lb (126.1 kg)    Obese male, appears comfortable at rest. Using a cane. HEENT: Conjunctiva and lids normal, oropharynx clear.  Neck: Supple, no increased JVP, no carotid bruits, no thyromegaly.  Lungs: Clear to auscultation, nonlabored breathing at rest.  Cardiac: Regular rate and rhythm, no S3, soft apical systolic murmur, no pericardial rub.  Abdomen: Soft, nontender, protuberant, bowel sounds present.  Extremities: Chronic appearing edema, distal pulses 1-2+. Skin: Warm and dry. Musculoskeletal: No kyphosis. Neuropsychiatric: Alert and oriented 3, affect appropriate.  ECG: I personally reviewed the tracing from 10/06/2016 which showed an atrial paced rhythm.  Recent Labwork:  September 2016: BUN 17; Creatinine, Ser 1.22; Hemoglobin 14.6; Platelets 209; Potassium 4.0; Sodium 140  Other Studies Reviewed Today:  Echocardiogram 12/17/2013: Study Conclusions  - Left ventricle:  The cavity size was normal. Wall thickness was increased in a pattern of mild LVH. Systolic function was normal. The estimated ejection fraction was in the range of 60% to 65%. There is hypokinesis of the basalinferior myocardium. Doppler parameters are consistent with abnormal left ventricular relaxation (grade 1 diastolic dysfunction). - Aortic valve: Mildly calcified annulus. Trileaflet; mildly thickened leaflets. Trivial regurgitation. - Mitral valve: Calcified annulus. Mildly thickened leaflets . Mild regurgitation directed eccentrically. - Left atrium: The atrium was mildly dilated. The appendage was mildly to moderately dilated. - Right ventricle: Pacer wire or catheter  noted in right ventricle. - Tricuspid valve: Mild regurgitation. Peak RV-RA gradient: 47m Hg (S). - Pulmonary arteries: Systolic pressure could not be accurately estimated. Likely mildly increased based on RV-RA gradient, assuming normal CVP. - Inferior vena cava: Not visualized. Unable toestimate CVP. - Pericardium, extracardiac: A prominent pericardial fat pad was present. Impressions:  - Unable to compare directly with prior study March 2014. Mild LVH with LVEF 60-65%, basal inferior hypokinesis, grade 1 diastolic dysfunction. Mild left atrial enlargement. MAC with mild mitral regurgitation. Device wire noted in the right heart. Mild tricuspid regurgitation. RV-RA gradient 38 mmHg, likely mildly increased PASP assuming normal CVP. This is significantly improved compared with prior report.  Assessment and Plan:  1. CAD status post DES to the posterolateral and 2006. She does not report any active angina symptoms on medical therapy, and we will continue with observation for now.  2. History of intermittent dizziness and suspected orthostasis although not proven at all episodes. Question whether intermittent SVT could also be contributing based on device  interrogation. Cozaar dose was already cut back. Blood pressure is normal today. No other changes were made today.  3. History of recurrent pulmonary emboli, continues on chronic Coumadin with follow-up per Dr. HSherrie Sport  4. Sick sinus syndrome status post Medtronic pacemaker placement. He continues to follow with Dr. ARayann Heman  Current medicines were reviewed with the patient today.  Disposition: Follow-up in 6 months.  Signed, SSatira Sark MD, FSelect Specialty Hospital - [facility]ville4/07/2017 1:17 PM    [place] [place] [address]Phone: [phone] Fax: [phone]

## 2017-02-25 NOTE — Telephone Encounter (Signed)
LMOVM reminding pt to send remote transmission.   

## 2017-02-26 NOTE — Progress Notes (Signed)
Remote pacemaker transmission.   

## 2017-02-27 ENCOUNTER — Encounter: Payer: Self-pay | Admitting: Cardiology

## 2017-02-27 DIAGNOSIS — Z7901 Long term (current) use of anticoagulants: Secondary | ICD-10-CM | POA: Diagnosis not present

## 2017-02-28 LAB — CUP PACEART REMOTE DEVICE CHECK
Battery Voltage: 2.79 V
Brady Statistic AP VS Percent: 20 %
Brady Statistic AS VS Percent: 48 %
Implantable Lead Implant Date: 20060828
Implantable Lead Location: 753859
Implantable Lead Location: 753860
Lead Channel Impedance Value: 376 Ohm
Lead Channel Impedance Value: 470 Ohm
Lead Channel Pacing Threshold Amplitude: 1 V
Lead Channel Pacing Threshold Pulse Width: 0.4 ms
Lead Channel Pacing Threshold Pulse Width: 0.4 ms
Lead Channel Sensing Intrinsic Amplitude: 2.8 mV
Lead Channel Sensing Intrinsic Amplitude: 5.6 mV
MDC IDC LEAD IMPLANT DT: 20060828
MDC IDC MSMT BATTERY IMPEDANCE: 135 Ohm
MDC IDC MSMT BATTERY REMAINING LONGEVITY: 122 mo
MDC IDC MSMT LEADCHNL RV PACING THRESHOLD AMPLITUDE: 1.875 V
MDC IDC PG IMPLANT DT: 20160906
MDC IDC SESS DTM: 20180409192444
MDC IDC SET LEADCHNL RA PACING AMPLITUDE: 2 V
MDC IDC SET LEADCHNL RV PACING AMPLITUDE: 3.75 V
MDC IDC SET LEADCHNL RV PACING PULSEWIDTH: 0.4 ms
MDC IDC SET LEADCHNL RV SENSING SENSITIVITY: 2 mV
MDC IDC STAT BRADY AP VP PERCENT: 18 %
MDC IDC STAT BRADY AS VP PERCENT: 15 %

## 2017-04-03 DIAGNOSIS — Z7901 Long term (current) use of anticoagulants: Secondary | ICD-10-CM | POA: Diagnosis not present

## 2017-05-01 DIAGNOSIS — I2782 Chronic pulmonary embolism: Secondary | ICD-10-CM | POA: Diagnosis not present

## 2017-05-20 DIAGNOSIS — I5022 Chronic systolic (congestive) heart failure: Secondary | ICD-10-CM | POA: Diagnosis not present

## 2017-05-20 DIAGNOSIS — I2782 Chronic pulmonary embolism: Secondary | ICD-10-CM | POA: Diagnosis not present

## 2017-05-20 DIAGNOSIS — I10 Essential (primary) hypertension: Secondary | ICD-10-CM | POA: Diagnosis not present

## 2017-05-20 DIAGNOSIS — E784 Other hyperlipidemia: Secondary | ICD-10-CM | POA: Diagnosis not present

## 2017-05-20 DIAGNOSIS — Z6835 Body mass index (BMI) 35.0-35.9, adult: Secondary | ICD-10-CM | POA: Diagnosis not present

## 2017-05-20 DIAGNOSIS — G40804 Other epilepsy, intractable, without status epilepticus: Secondary | ICD-10-CM | POA: Diagnosis not present

## 2017-05-20 DIAGNOSIS — E1143 Type 2 diabetes mellitus with diabetic autonomic (poly)neuropathy: Secondary | ICD-10-CM | POA: Diagnosis not present

## 2017-05-27 ENCOUNTER — Telehealth: Payer: Self-pay | Admitting: Cardiology

## 2017-05-27 ENCOUNTER — Ambulatory Visit (INDEPENDENT_AMBULATORY_CARE_PROVIDER_SITE_OTHER): Payer: Medicare HMO | Admitting: *Deleted

## 2017-05-27 DIAGNOSIS — I495 Sick sinus syndrome: Secondary | ICD-10-CM | POA: Diagnosis not present

## 2017-05-27 NOTE — Telephone Encounter (Signed)
Confirmed remote transmission w/ pt wife.   

## 2017-05-28 LAB — CUP PACEART REMOTE DEVICE CHECK
Battery Impedance: 135 Ohm
Battery Voltage: 2.79 V
Brady Statistic AP VS Percent: 24 %
Brady Statistic AS VP Percent: 14 %
Brady Statistic AS VS Percent: 41 %
Implantable Lead Implant Date: 20060828
Implantable Lead Location: 753859
Implantable Lead Model: 5076
Implantable Lead Model: 5076
Lead Channel Impedance Value: 463 Ohm
Lead Channel Pacing Threshold Amplitude: 0.875 V
Lead Channel Pacing Threshold Pulse Width: 0.4 ms
Lead Channel Setting Pacing Amplitude: 2.5 V
Lead Channel Setting Pacing Pulse Width: 0.4 ms
Lead Channel Setting Sensing Sensitivity: 2 mV
MDC IDC LEAD IMPLANT DT: 20060828
MDC IDC LEAD LOCATION: 753860
MDC IDC MSMT BATTERY REMAINING LONGEVITY: 131 mo
MDC IDC MSMT LEADCHNL RA IMPEDANCE VALUE: 367 Ohm
MDC IDC MSMT LEADCHNL RV PACING THRESHOLD AMPLITUDE: 1.25 V
MDC IDC MSMT LEADCHNL RV PACING THRESHOLD PULSEWIDTH: 0.4 ms
MDC IDC PG IMPLANT DT: 20160906
MDC IDC SESS DTM: 20180709163446
MDC IDC SET LEADCHNL RA PACING AMPLITUDE: 2 V
MDC IDC STAT BRADY AP VP PERCENT: 20 %

## 2017-05-28 NOTE — Progress Notes (Signed)
Remote pacemaker transmission.   

## 2017-06-03 ENCOUNTER — Encounter: Payer: Self-pay | Admitting: Cardiology

## 2017-07-03 DIAGNOSIS — I2782 Chronic pulmonary embolism: Secondary | ICD-10-CM | POA: Diagnosis not present

## 2017-07-31 DIAGNOSIS — I2782 Chronic pulmonary embolism: Secondary | ICD-10-CM | POA: Diagnosis not present

## 2017-08-21 NOTE — Progress Notes (Signed)
Cardiology Office Note  Date: 08/23/2017   ID: [name], DOB 30-Aug-1935, MRN [id number]  PCP: Jermaine Burly, MD  Primary Cardiologist: Jermaine Lesches, MD   Chief Complaint  Patient presents with  . Coronary Artery Disease    History of Present Illness: [name] is an 82 y.o. male last seen in April. He presents for a routine follow-up visit. Reports chronic lack of energy, does not exercise and is fairly sedentary at baseline. He uses a cane, denies any falls. With current level of activity he does not report any angina on present medical therapy.   He continues on Coumadin with follow-up per Dr. Sherrie Hughes with a history of pulmonary emboli. He does not report any bleeding problems.  I reviewed his current medications. Current cardiac regimen includes aspirin, Lipitor, Coreg, Lasix, Lopid, Cozaar, and omega-3 supplements.  He follows with Dr. Rayann Hughes in the device clinic, Medtronic pacemaker in place. Last device check in July showed normal function.  Past Medical History:  Diagnosis Date  . Carotid sinus syndrome    Status post pacemaker  . Coronary atherosclerosis of native coronary artery    DES to PL 2006, mild to moderate residual disease 2010   . Essential hypertension, benign   . Mixed hyperlipidemia   . Obstructive sleep apnea    CPAP  . Pacemaker 1986   Medtronic  . Pulmonary emboli [facility])    Diagnosed 2012 and 2014, IVC filter March 2014, recurrent March 5498 - thromboembolic therapy at [facility] and placed back on anticoagulation  . Pulmonary hypertension (HCC)    Severe, RVSP 85-90 mmHg March 2014   . Stroke ([place]) 11/2012  . Subdural hematoma [facility])    January 2014, occurred on Coumadin  . Type 2 diabetes mellitus ([place])     Past Surgical History:  Procedure Laterality Date  . arm surgery Right   . CATARACT EXTRACTION W/PHACO Right 02/18/2014   Procedure: CATARACT EXTRACTION PHACO AND INTRAOCULAR LENS PLACEMENT (IOC);  Surgeon: Jermaine Branch, MD;   Location: [facility];  Service: Ophthalmology;  Laterality: Right;  CDE: 11.83  . CATARACT EXTRACTION W/PHACO Left 03/25/2014   Procedure: CATARACT EXTRACTION PHACO AND INTRAOCULAR LENS PLACEMENT (IOC);  Surgeon: Jermaine Branch, MD;  Location: [facility];  Service: Ophthalmology;  Laterality: Left;  CDE 15.93  . CHOLECYSTECTOMY  2011  . EP IMPLANTABLE DEVICE N/A 07/26/2015   MDT Adapta L gen change by Dr [name]  . FILTERING PROCEDURE  11/2012   placed due to blood clots from the lungs  . PACEMAKER IMPLANTED   07/16/2005   Medtronic Kappa 900 DR    Current Outpatient Prescriptions  Medication Sig Dispense Refill  . allopurinol (ZYLOPRIM) 300 MG tablet Take 300 mg by mouth daily after breakfast.     . aspirin EC 81 MG tablet Take 81 mg by mouth daily after breakfast.    . atorvastatin (LIPITOR) 40 MG tablet Take 40 mg by mouth daily after breakfast.     . carvedilol (COREG) 6.25 MG tablet Take 1 tablet (6.25 mg total) by mouth 2 (two) times daily with a meal. 180 tablet 3  . citalopram (CELEXA) 20 MG tablet Take 20 mg by mouth daily.  0  . colchicine 0.6 MG tablet Take 0.6 mg by mouth daily as needed (gout attacks).     . fenofibrate (TRICOR) 145 MG tablet Take 145 mg by mouth daily.      . ferrous sulfate 325 (65 FE) MG tablet Take 325 mg by  mouth 3 (three) times daily with meals.    . furosemide (LASIX) 20 MG tablet Take 20 mg by mouth daily.     Marland Kitchen gemfibrozil (LOPID) 600 MG tablet Take 600 mg by mouth daily.    Marland Kitchen glimepiride (AMARYL) 2 MG tablet Take 2 mg by mouth 2 (two) times daily.     Marland Kitchen levETIRAcetam (KEPPRA) 750 MG tablet Take 750 mg by mouth 2 (two) times daily.     Marland Kitchen losartan (COZAAR) 25 MG tablet TAKE 1 TABLET EVERY DAY  (DOSE  DECREASE) 90 tablet 3  . metFORMIN (GLUCOPHAGE) 1000 MG tablet Take 1,000 mg by mouth 2 (two) times daily with a meal.    . Omega-3 Fatty Acids (FISH OIL) 1000 MG CAPS Take 1 capsule by mouth 3 (three) times daily.    Marland Kitchen omeprazole (PRILOSEC) 40 MG capsule Take 40 mg by  mouth daily.      Marland Kitchen warfarin (COUMADIN) 3 MG tablet Take 3 mg by mouth every other day. Alternate with 4 mg tablet (daily after breakfast) or as directed by Dr. Natasha Hughes    . warfarin (COUMADIN) 4 MG tablet Take 4 mg by mouth every other day. Alternate with 3 mg tablet (daily after breakfast) or as directed by Dr. Natasha Hughes     No current facility-administered medications for this visit.    Allergies:  Nitroglycerin   Social History: The patient  reports that he has never smoked. He has never used smokeless tobacco. He reports that he does not drink alcohol or use drugs.   ROS:  Please see the history of present illness. Otherwise, complete review of systems is positive for chronic fatigue, arthritic pain and stiffness.  All other systems are reviewed and negative.   Physical Exam: VS:  BP 138/82   Pulse 83   Ht 6' 1"  (1.854 m)   Wt 278 lb (126.1 kg)   SpO2 92%   BMI 36.68 kg/m , BMI Body mass index is 36.68 kg/m.  Wt Readings from Last 3 Encounters:  08/23/17 278 lb (126.1 kg)  02/25/17 265 lb 3.2 oz (120.3 kg)  11/23/16 269 lb (122 kg)    General: Obese male, appears comfortable at rest. Using a cane. HEENT: Conjunctiva and lids normal, oropharynx clear . Neck: Supple, no elevated JVP or carotid bruits, no thyromegaly. Lungs: Clear to auscultation, nonlabored breathing at rest. Cardiac: Regular rate and rhythm, no S3, soft systolic murmur, no pericardial rub. Abdomen: Protuberant, nontender, bowel sounds present, no guarding or rebound. Extremities: Chronic appearing leg edema and venous stasis, distal pulses 1-2+. Skin: Warm and dry. Musculoskeletal: No kyphosis. Neuropsychiatric: Alert and oriented x3, affect grossly appropriate.  ECG: I personally reviewed the tracing from 10/06/2016 which showed an atrial paced rhythm.  Recent Labwork:  November 2017: BUN 20, creatinine 1.1, potassium 3.3, AST 49, ALT 31, troponin T negative 3, hemoglobin 12.0, platelets 238  Other  Studies Reviewed Today:  Echocardiogram 12/17/2013: Study Conclusions  - Left ventricle: The cavity size was normal. Wall thickness was increased in a pattern of mild LVH. Systolic function was normal. The estimated ejection fraction was in the range of 60% to 65%. There is hypokinesis of the basalinferior myocardium. Doppler parameters are consistent with abnormal left ventricular relaxation (grade 1 diastolic dysfunction). - Aortic valve: Mildly calcified annulus. Trileaflet; mildly thickened leaflets. Trivial regurgitation. - Mitral valve: Calcified annulus. Mildly thickened leaflets . Mild regurgitation directed eccentrically. - Left atrium: The atrium was mildly dilated. The appendage was mildly to moderately dilated. -  Right ventricle: Pacer wire or catheter noted in right ventricle. - Tricuspid valve: Mild regurgitation. Peak RV-RA gradient: 20m Hg (S). - Pulmonary arteries: Systolic pressure could not be accurately estimated. Likely mildly increased based on RV-RA gradient, assuming normal CVP. - Inferior vena cava: Not visualized. Unable toestimate CVP. - Pericardium, extracardiac: A prominent pericardial fat pad was present. Impressions:  - Unable to compare directly with prior study March 2014. Mild LVH with LVEF 60-65%, basal inferior hypokinesis, grade 1 diastolic dysfunction. Mild left atrial enlargement. MAC with mild mitral regurgitation. Device wire noted in the right heart. Mild tricuspid regurgitation. RV-RA gradient 38 mmHg, likely mildly increased PASP assuming normal CVP. This is significantly improved compared with prior report.  Assessment and Plan:  1. CAD status post DES to the PL in 2006. Would continue with medical therapy and observation in the absence of progressive angina symptoms.  2. History of intermittent dizziness, presently stable without new symptoms. Blood pressure is reasonably controlled  today on current regimen. No changes were made.  3. History of recurrent pulmonary emboli, on Coumadin with follow-up per Dr. HSherrie Hughes  4. Sick sinus syndrome status post Medtronic pacemaker. He continues to follow in the device clinic with Dr. ARayann Hughes  Current medicines were reviewed with the patient today.  Disposition: Follow-up in 6 months.  Signed, SSatira Sark MD, [facility]10/03/2017 1:16 PM    [place] [place] [address]Phone: [phone] Fax: [phone]

## 2017-08-22 ENCOUNTER — Encounter: Payer: Self-pay | Admitting: *Deleted

## 2017-08-23 ENCOUNTER — Encounter: Payer: Self-pay | Admitting: Cardiology

## 2017-08-23 ENCOUNTER — Ambulatory Visit (INDEPENDENT_AMBULATORY_CARE_PROVIDER_SITE_OTHER): Payer: Medicare HMO | Admitting: Cardiology

## 2017-08-23 VITALS — BP 138/82 | HR 83 | Ht 73.0 in | Wt 278.0 lb

## 2017-08-23 DIAGNOSIS — I2699 Other pulmonary embolism without acute cor pulmonale: Secondary | ICD-10-CM | POA: Diagnosis not present

## 2017-08-23 DIAGNOSIS — Z950 Presence of cardiac pacemaker: Secondary | ICD-10-CM

## 2017-08-23 DIAGNOSIS — I2510 Atherosclerotic heart disease of native coronary artery without angina pectoris: Secondary | ICD-10-CM

## 2017-08-23 DIAGNOSIS — Z23 Encounter for immunization: Secondary | ICD-10-CM

## 2017-08-23 DIAGNOSIS — R42 Dizziness and giddiness: Secondary | ICD-10-CM

## 2017-08-23 NOTE — Patient Instructions (Signed)

## 2017-08-26 ENCOUNTER — Encounter: Payer: Medicare HMO | Admitting: *Deleted

## 2017-08-26 ENCOUNTER — Telehealth: Payer: Self-pay | Admitting: Cardiology

## 2017-08-26 DIAGNOSIS — I2782 Chronic pulmonary embolism: Secondary | ICD-10-CM | POA: Diagnosis not present

## 2017-08-26 DIAGNOSIS — I10 Essential (primary) hypertension: Secondary | ICD-10-CM | POA: Diagnosis not present

## 2017-08-26 DIAGNOSIS — G40804 Other epilepsy, intractable, without status epilepticus: Secondary | ICD-10-CM | POA: Diagnosis not present

## 2017-08-26 DIAGNOSIS — Z6836 Body mass index (BMI) 36.0-36.9, adult: Secondary | ICD-10-CM | POA: Diagnosis not present

## 2017-08-26 DIAGNOSIS — E7849 Other hyperlipidemia: Secondary | ICD-10-CM | POA: Diagnosis not present

## 2017-08-26 DIAGNOSIS — E1143 Type 2 diabetes mellitus with diabetic autonomic (poly)neuropathy: Secondary | ICD-10-CM | POA: Diagnosis not present

## 2017-08-26 NOTE — Telephone Encounter (Signed)
LMOVM reminding pt to send remote transmission.   

## 2017-08-30 ENCOUNTER — Encounter: Payer: Self-pay | Admitting: Cardiology

## 2017-09-05 ENCOUNTER — Ambulatory Visit (INDEPENDENT_AMBULATORY_CARE_PROVIDER_SITE_OTHER): Payer: Medicare HMO | Admitting: *Deleted

## 2017-09-05 DIAGNOSIS — I495 Sick sinus syndrome: Secondary | ICD-10-CM

## 2017-09-05 NOTE — Progress Notes (Signed)
Remote pacemaker transmission.   

## 2017-09-12 ENCOUNTER — Encounter: Payer: Self-pay | Admitting: Cardiology

## 2017-09-13 ENCOUNTER — Telehealth: Payer: Self-pay

## 2017-09-13 NOTE — Telephone Encounter (Signed)
Patients wife contacted office stating patient was having severe chest pain. Patient came inside to eat and started grabbing his chest stating my chest my chest. Asked wife if patient had any other symptoms or how severe the pain was. Wife stated patient did not want to talk at this time and that patient went to lie down. Instructed wife patient needed to go straight to the ER by EMS. Patient refused and stated he was going to lay down and hope the pain went away. Informed wife this was not recommended. Wife stated there was nothing more she could do and refused EMS. Wife stated patient did not have any nitroglycerin either. Wife verbalized understanding of patients need to be seen by a doctor. Will send to Dr. Diona BrownerMcDowell as an Lorain ChildesFYI

## 2017-09-27 DIAGNOSIS — Z0100 Encounter for examination of eyes and vision without abnormal findings: Secondary | ICD-10-CM | POA: Diagnosis not present

## 2017-09-27 DIAGNOSIS — Z961 Presence of intraocular lens: Secondary | ICD-10-CM | POA: Diagnosis not present

## 2017-09-27 DIAGNOSIS — Z7984 Long term (current) use of oral hypoglycemic drugs: Secondary | ICD-10-CM | POA: Diagnosis not present

## 2017-09-27 DIAGNOSIS — H02403 Unspecified ptosis of bilateral eyelids: Secondary | ICD-10-CM | POA: Diagnosis not present

## 2017-09-27 DIAGNOSIS — E119 Type 2 diabetes mellitus without complications: Secondary | ICD-10-CM | POA: Diagnosis not present

## 2017-09-27 LAB — CUP PACEART REMOTE DEVICE CHECK
Battery Voltage: 2.79 V
Brady Statistic AP VP Percent: 27 %
Brady Statistic AP VS Percent: 20 %
Brady Statistic AS VS Percent: 34 %
Date Time Interrogation Session: 20181018130431
Implantable Lead Implant Date: 20060828
Implantable Lead Location: 753859
Implantable Lead Location: 753860
Lead Channel Impedance Value: 391 Ohm
Lead Channel Pacing Threshold Amplitude: 1 V
Lead Channel Pacing Threshold Pulse Width: 0.4 ms
Lead Channel Setting Pacing Amplitude: 3.5 V
Lead Channel Setting Pacing Pulse Width: 0.4 ms
MDC IDC LEAD IMPLANT DT: 20060828
MDC IDC MSMT BATTERY IMPEDANCE: 135 Ohm
MDC IDC MSMT BATTERY REMAINING LONGEVITY: 123 mo
MDC IDC MSMT LEADCHNL RA PACING THRESHOLD PULSEWIDTH: 0.4 ms
MDC IDC MSMT LEADCHNL RV IMPEDANCE VALUE: 476 Ohm
MDC IDC MSMT LEADCHNL RV PACING THRESHOLD AMPLITUDE: 1.75 V
MDC IDC PG IMPLANT DT: 20160906
MDC IDC SET LEADCHNL RA PACING AMPLITUDE: 2 V
MDC IDC SET LEADCHNL RV SENSING SENSITIVITY: 2 mV
MDC IDC STAT BRADY AS VP PERCENT: 19 %

## 2017-10-02 DIAGNOSIS — I2782 Chronic pulmonary embolism: Secondary | ICD-10-CM | POA: Diagnosis not present

## 2017-10-21 DIAGNOSIS — R5600 Simple febrile convulsions: Secondary | ICD-10-CM | POA: Diagnosis not present

## 2017-10-21 DIAGNOSIS — R079 Chest pain, unspecified: Secondary | ICD-10-CM | POA: Diagnosis not present

## 2017-10-21 DIAGNOSIS — F0280 Dementia in other diseases classified elsewhere without behavioral disturbance: Secondary | ICD-10-CM | POA: Diagnosis not present

## 2017-10-21 DIAGNOSIS — K219 Gastro-esophageal reflux disease without esophagitis: Secondary | ICD-10-CM | POA: Diagnosis not present

## 2017-10-21 DIAGNOSIS — R569 Unspecified convulsions: Secondary | ICD-10-CM | POA: Diagnosis not present

## 2017-10-21 DIAGNOSIS — I130 Hypertensive heart and chronic kidney disease with heart failure and stage 1 through stage 4 chronic kidney disease, or unspecified chronic kidney disease: Secondary | ICD-10-CM | POA: Diagnosis not present

## 2017-10-21 DIAGNOSIS — N183 Chronic kidney disease, stage 3 (moderate): Secondary | ICD-10-CM | POA: Diagnosis not present

## 2017-10-21 DIAGNOSIS — E1122 Type 2 diabetes mellitus with diabetic chronic kidney disease: Secondary | ICD-10-CM | POA: Diagnosis not present

## 2017-10-21 DIAGNOSIS — Z86711 Personal history of pulmonary embolism: Secondary | ICD-10-CM | POA: Diagnosis not present

## 2017-10-21 DIAGNOSIS — I5030 Unspecified diastolic (congestive) heart failure: Secondary | ICD-10-CM | POA: Diagnosis not present

## 2017-10-21 DIAGNOSIS — F0390 Unspecified dementia without behavioral disturbance: Secondary | ICD-10-CM | POA: Diagnosis not present

## 2017-10-21 DIAGNOSIS — R0789 Other chest pain: Secondary | ICD-10-CM | POA: Diagnosis not present

## 2017-10-22 DIAGNOSIS — Z86711 Personal history of pulmonary embolism: Secondary | ICD-10-CM | POA: Diagnosis not present

## 2017-10-22 DIAGNOSIS — K219 Gastro-esophageal reflux disease without esophagitis: Secondary | ICD-10-CM | POA: Diagnosis not present

## 2017-10-22 DIAGNOSIS — F0280 Dementia in other diseases classified elsewhere without behavioral disturbance: Secondary | ICD-10-CM | POA: Diagnosis not present

## 2017-10-22 DIAGNOSIS — R5600 Simple febrile convulsions: Secondary | ICD-10-CM | POA: Diagnosis not present

## 2017-10-22 DIAGNOSIS — R0789 Other chest pain: Secondary | ICD-10-CM | POA: Diagnosis not present

## 2017-10-25 DIAGNOSIS — R079 Chest pain, unspecified: Secondary | ICD-10-CM | POA: Diagnosis not present

## 2017-11-01 DIAGNOSIS — Z6835 Body mass index (BMI) 35.0-35.9, adult: Secondary | ICD-10-CM | POA: Diagnosis not present

## 2017-11-01 DIAGNOSIS — I201 Angina pectoris with documented spasm: Secondary | ICD-10-CM | POA: Diagnosis not present

## 2017-11-27 DIAGNOSIS — Z7901 Long term (current) use of anticoagulants: Secondary | ICD-10-CM | POA: Diagnosis not present

## 2017-11-27 DIAGNOSIS — I201 Angina pectoris with documented spasm: Secondary | ICD-10-CM | POA: Diagnosis not present

## 2017-11-27 DIAGNOSIS — Z6835 Body mass index (BMI) 35.0-35.9, adult: Secondary | ICD-10-CM | POA: Diagnosis not present

## 2017-11-29 DIAGNOSIS — S0083XA Contusion of other part of head, initial encounter: Secondary | ICD-10-CM | POA: Diagnosis not present

## 2017-11-29 DIAGNOSIS — S299XXA Unspecified injury of thorax, initial encounter: Secondary | ICD-10-CM | POA: Diagnosis not present

## 2017-11-29 DIAGNOSIS — S098XXA Other specified injuries of head, initial encounter: Secondary | ICD-10-CM | POA: Diagnosis not present

## 2017-11-29 DIAGNOSIS — K219 Gastro-esophageal reflux disease without esophagitis: Secondary | ICD-10-CM | POA: Diagnosis not present

## 2017-11-29 DIAGNOSIS — M109 Gout, unspecified: Secondary | ICD-10-CM | POA: Diagnosis not present

## 2017-11-29 DIAGNOSIS — E7800 Pure hypercholesterolemia, unspecified: Secondary | ICD-10-CM | POA: Diagnosis not present

## 2017-11-29 DIAGNOSIS — I10 Essential (primary) hypertension: Secondary | ICD-10-CM | POA: Diagnosis not present

## 2017-11-29 DIAGNOSIS — S301XXA Contusion of abdominal wall, initial encounter: Secondary | ICD-10-CM | POA: Diagnosis not present

## 2017-11-29 DIAGNOSIS — E119 Type 2 diabetes mellitus without complications: Secondary | ICD-10-CM | POA: Diagnosis not present

## 2017-11-29 DIAGNOSIS — S3991XA Unspecified injury of abdomen, initial encounter: Secondary | ICD-10-CM | POA: Diagnosis not present

## 2017-11-29 DIAGNOSIS — R569 Unspecified convulsions: Secondary | ICD-10-CM | POA: Diagnosis not present

## 2017-12-04 ENCOUNTER — Encounter: Payer: Self-pay | Admitting: *Deleted

## 2017-12-05 ENCOUNTER — Encounter: Payer: Medicare HMO | Admitting: *Deleted

## 2017-12-05 ENCOUNTER — Telehealth: Payer: Self-pay | Admitting: Cardiology

## 2017-12-05 NOTE — Telephone Encounter (Signed)
Attempted to confirm remote transmission with pt. No answer and was unable to leave a message.   

## 2017-12-06 ENCOUNTER — Encounter: Payer: Self-pay | Admitting: Cardiology

## 2017-12-16 ENCOUNTER — Ambulatory Visit (INDEPENDENT_AMBULATORY_CARE_PROVIDER_SITE_OTHER): Payer: Medicare HMO | Admitting: *Deleted

## 2017-12-16 DIAGNOSIS — I495 Sick sinus syndrome: Secondary | ICD-10-CM | POA: Diagnosis not present

## 2017-12-16 NOTE — Progress Notes (Signed)
Remote pacemaker transmission.   

## 2017-12-17 LAB — CUP PACEART REMOTE DEVICE CHECK
Battery Impedance: 159 Ohm
Brady Statistic AP VS Percent: 16 %
Brady Statistic AS VP Percent: 23 %
Brady Statistic AS VS Percent: 29 %
Date Time Interrogation Session: 20190128141216
Implantable Lead Implant Date: 20060828
Implantable Lead Location: 753859
Implantable Lead Model: 5076
Lead Channel Impedance Value: 564 Ohm
Lead Channel Pacing Threshold Pulse Width: 0.4 ms
Lead Channel Sensing Intrinsic Amplitude: 5.6 mV
Lead Channel Setting Pacing Amplitude: 2 V
MDC IDC LEAD IMPLANT DT: 20060828
MDC IDC LEAD LOCATION: 753860
MDC IDC MSMT BATTERY REMAINING LONGEVITY: 107 mo
MDC IDC MSMT BATTERY VOLTAGE: 2.79 V
MDC IDC MSMT LEADCHNL RA IMPEDANCE VALUE: 381 Ohm
MDC IDC MSMT LEADCHNL RA PACING THRESHOLD AMPLITUDE: 0.75 V
MDC IDC MSMT LEADCHNL RA PACING THRESHOLD PULSEWIDTH: 0.4 ms
MDC IDC MSMT LEADCHNL RA SENSING INTR AMPL: 2.8 mV
MDC IDC MSMT LEADCHNL RV PACING THRESHOLD AMPLITUDE: 2 V
MDC IDC PG IMPLANT DT: 20160906
MDC IDC SET LEADCHNL RV PACING AMPLITUDE: 4 V
MDC IDC SET LEADCHNL RV PACING PULSEWIDTH: 0.4 ms
MDC IDC SET LEADCHNL RV SENSING SENSITIVITY: 2 mV
MDC IDC STAT BRADY AP VP PERCENT: 32 %

## 2017-12-20 ENCOUNTER — Encounter: Payer: Medicare HMO | Admitting: Internal Medicine

## 2017-12-25 DIAGNOSIS — Z7901 Long term (current) use of anticoagulants: Secondary | ICD-10-CM | POA: Diagnosis not present

## 2018-01-02 ENCOUNTER — Other Ambulatory Visit: Payer: Self-pay | Admitting: Cardiology

## 2018-01-21 DIAGNOSIS — R531 Weakness: Secondary | ICD-10-CM | POA: Diagnosis not present

## 2018-01-21 DIAGNOSIS — R69 Illness, unspecified: Secondary | ICD-10-CM | POA: Diagnosis not present

## 2018-01-22 DIAGNOSIS — R531 Weakness: Secondary | ICD-10-CM | POA: Diagnosis not present

## 2018-01-22 DIAGNOSIS — M47816 Spondylosis without myelopathy or radiculopathy, lumbar region: Secondary | ICD-10-CM | POA: Diagnosis not present

## 2018-01-22 DIAGNOSIS — R55 Syncope and collapse: Secondary | ICD-10-CM | POA: Diagnosis not present

## 2018-01-22 DIAGNOSIS — S299XXA Unspecified injury of thorax, initial encounter: Secondary | ICD-10-CM | POA: Diagnosis not present

## 2018-01-22 DIAGNOSIS — G459 Transient cerebral ischemic attack, unspecified: Secondary | ICD-10-CM | POA: Diagnosis not present

## 2018-01-22 DIAGNOSIS — M48061 Spinal stenosis, lumbar region without neurogenic claudication: Secondary | ICD-10-CM | POA: Diagnosis not present

## 2018-01-22 DIAGNOSIS — F0150 Vascular dementia without behavioral disturbance: Secondary | ICD-10-CM | POA: Diagnosis not present

## 2018-01-22 DIAGNOSIS — Z981 Arthrodesis status: Secondary | ICD-10-CM | POA: Diagnosis not present

## 2018-01-22 DIAGNOSIS — Z4789 Encounter for other orthopedic aftercare: Secondary | ICD-10-CM | POA: Diagnosis not present

## 2018-01-22 DIAGNOSIS — M47896 Other spondylosis, lumbar region: Secondary | ICD-10-CM | POA: Diagnosis not present

## 2018-01-22 DIAGNOSIS — R269 Unspecified abnormalities of gait and mobility: Secondary | ICD-10-CM | POA: Diagnosis not present

## 2018-01-22 DIAGNOSIS — R2681 Unsteadiness on feet: Secondary | ICD-10-CM | POA: Diagnosis not present

## 2018-01-22 DIAGNOSIS — M48062 Spinal stenosis, lumbar region with neurogenic claudication: Secondary | ICD-10-CM | POA: Diagnosis not present

## 2018-01-22 DIAGNOSIS — R404 Transient alteration of awareness: Secondary | ICD-10-CM | POA: Diagnosis not present

## 2018-01-22 DIAGNOSIS — I5032 Chronic diastolic (congestive) heart failure: Secondary | ICD-10-CM | POA: Diagnosis not present

## 2018-01-22 DIAGNOSIS — R569 Unspecified convulsions: Secondary | ICD-10-CM | POA: Diagnosis not present

## 2018-01-22 DIAGNOSIS — R2689 Other abnormalities of gait and mobility: Secondary | ICD-10-CM | POA: Diagnosis not present

## 2018-01-22 DIAGNOSIS — K219 Gastro-esophageal reflux disease without esophagitis: Secondary | ICD-10-CM | POA: Diagnosis not present

## 2018-01-22 DIAGNOSIS — M5126 Other intervertebral disc displacement, lumbar region: Secondary | ICD-10-CM | POA: Diagnosis not present

## 2018-01-22 DIAGNOSIS — I10 Essential (primary) hypertension: Secondary | ICD-10-CM | POA: Diagnosis not present

## 2018-01-22 DIAGNOSIS — S3992XA Unspecified injury of lower back, initial encounter: Secondary | ICD-10-CM | POA: Diagnosis not present

## 2018-01-22 DIAGNOSIS — M6281 Muscle weakness (generalized): Secondary | ICD-10-CM | POA: Diagnosis not present

## 2018-01-31 DIAGNOSIS — M47896 Other spondylosis, lumbar region: Secondary | ICD-10-CM | POA: Diagnosis not present

## 2018-01-31 DIAGNOSIS — F0150 Vascular dementia without behavioral disturbance: Secondary | ICD-10-CM | POA: Diagnosis not present

## 2018-01-31 DIAGNOSIS — Z7901 Long term (current) use of anticoagulants: Secondary | ICD-10-CM | POA: Diagnosis not present

## 2018-01-31 DIAGNOSIS — I10 Essential (primary) hypertension: Secondary | ICD-10-CM | POA: Diagnosis not present

## 2018-01-31 DIAGNOSIS — M6281 Muscle weakness (generalized): Secondary | ICD-10-CM | POA: Diagnosis not present

## 2018-01-31 DIAGNOSIS — R55 Syncope and collapse: Secondary | ICD-10-CM | POA: Diagnosis not present

## 2018-01-31 DIAGNOSIS — Z4789 Encounter for other orthopedic aftercare: Secondary | ICD-10-CM | POA: Diagnosis not present

## 2018-01-31 DIAGNOSIS — Z5181 Encounter for therapeutic drug level monitoring: Secondary | ICD-10-CM | POA: Diagnosis not present

## 2018-01-31 DIAGNOSIS — I5032 Chronic diastolic (congestive) heart failure: Secondary | ICD-10-CM | POA: Diagnosis not present

## 2018-01-31 DIAGNOSIS — M5126 Other intervertebral disc displacement, lumbar region: Secondary | ICD-10-CM | POA: Diagnosis not present

## 2018-01-31 DIAGNOSIS — M48062 Spinal stenosis, lumbar region with neurogenic claudication: Secondary | ICD-10-CM | POA: Diagnosis not present

## 2018-01-31 DIAGNOSIS — R05 Cough: Secondary | ICD-10-CM | POA: Diagnosis not present

## 2018-01-31 DIAGNOSIS — R2689 Other abnormalities of gait and mobility: Secondary | ICD-10-CM | POA: Diagnosis not present

## 2018-01-31 DIAGNOSIS — K219 Gastro-esophageal reflux disease without esophagitis: Secondary | ICD-10-CM | POA: Diagnosis not present

## 2018-01-31 DIAGNOSIS — R569 Unspecified convulsions: Secondary | ICD-10-CM | POA: Diagnosis not present

## 2018-01-31 DIAGNOSIS — M4807 Spinal stenosis, lumbosacral region: Secondary | ICD-10-CM | POA: Diagnosis not present

## 2018-02-01 DIAGNOSIS — K219 Gastro-esophageal reflux disease without esophagitis: Secondary | ICD-10-CM | POA: Diagnosis not present

## 2018-02-01 DIAGNOSIS — M4807 Spinal stenosis, lumbosacral region: Secondary | ICD-10-CM | POA: Diagnosis not present

## 2018-02-25 NOTE — Progress Notes (Deleted)
Cardiology Office Note  Date: 02/25/2018   ID: [name], DOB [birth year], MRN [id number]  PCP: Neale Burly, MD  Primary Cardiologist: Rozann Lesches, MD   No chief complaint on file.   History of Present Illness: [name] is an 83 y.o. male last seen in October 2018.  He remains on Coumadin with follow-up per Dr. Sherrie Sport due to history of pulmonary emboli.  He follows in the device clinic with Dr. Rayann Heman, Medtronic pacemaker in place.  Past Medical History:  Diagnosis Date  . Carotid sinus syndrome    Status post pacemaker  . Coronary atherosclerosis of native coronary artery    DES to PL 2006, mild to moderate residual disease 2010   . Essential hypertension, benign   . Mixed hyperlipidemia   . Obstructive sleep apnea    CPAP  . Pacemaker 1986   Medtronic  . Pulmonary emboli [facility])    Diagnosed 2012 and 2014, IVC filter March 2014, recurrent March 4332 - thromboembolic therapy at [facility] and placed back on anticoagulation  . Pulmonary hypertension (HCC)    Severe, RVSP 85-90 mmHg March 2014   . Stroke ([place]) 11/2012  . Subdural hematoma [facility])    January 2014, occurred on Coumadin  . Type 2 diabetes mellitus ([place])     Past Surgical History:  Procedure Laterality Date  . arm surgery Right   . CATARACT EXTRACTION W/PHACO Right 02/18/2014   Procedure: CATARACT EXTRACTION PHACO AND INTRAOCULAR LENS PLACEMENT (IOC);  Surgeon: Tonny Branch, MD;  Location: [facility] ORS;  Service: Ophthalmology;  Laterality: Right;  CDE: 11.83  . CATARACT EXTRACTION W/PHACO Left 03/25/2014   Procedure: CATARACT EXTRACTION PHACO AND INTRAOCULAR LENS PLACEMENT (IOC);  Surgeon: Tonny Branch, MD;  Location: [facility] ORS;  Service: Ophthalmology;  Laterality: Left;  CDE 15.93  . CHOLECYSTECTOMY  2011  . EP IMPLANTABLE DEVICE N/A 07/26/2015   MDT Adapta L gen change by Dr Rayann Heman  . FILTERING PROCEDURE  11/2012   placed due to blood clots from the lungs  . PACEMAKER IMPLANTED   07/16/2005   Medtronic  Kappa 900 DR    Current Outpatient Medications  Medication Sig Dispense Refill  . allopurinol (ZYLOPRIM) 300 MG tablet Take 300 mg by mouth daily after breakfast.     . aspirin EC 81 MG tablet Take 81 mg by mouth daily after breakfast.    . atorvastatin (LIPITOR) 40 MG tablet Take 40 mg by mouth daily after breakfast.     . carvedilol (COREG) 6.25 MG tablet Take 1 tablet (6.25 mg total) by mouth 2 (two) times daily with a meal. 180 tablet 3  . citalopram (CELEXA) 20 MG tablet Take 20 mg by mouth daily.  0  . colchicine 0.6 MG tablet Take 0.6 mg by mouth daily as needed (gout attacks).     . fenofibrate (TRICOR) 145 MG tablet Take 145 mg by mouth daily.      . ferrous sulfate 325 (65 FE) MG tablet Take 325 mg by mouth 3 (three) times daily with meals.    . furosemide (LASIX) 20 MG tablet Take 20 mg by mouth daily.     Marland Kitchen gemfibrozil (LOPID) 600 MG tablet Take 600 mg by mouth daily.    Marland Kitchen glimepiride (AMARYL) 2 MG tablet Take 2 mg by mouth 2 (two) times daily.     Marland Kitchen levETIRAcetam (KEPPRA) 750 MG tablet Take 750 mg by mouth 2 (two) times daily.     Marland Kitchen losartan (COZAAR) 25 MG  tablet TAKE 1 TABLET EVERY DAY  (DOSE  DECREASE) 90 tablet 0  . metFORMIN (GLUCOPHAGE) 1000 MG tablet Take 1,000 mg by mouth 2 (two) times daily with a meal.    . Omega-3 Fatty Acids (FISH OIL) 1000 MG CAPS Take 1 capsule by mouth 3 (three) times daily.    Marland Kitchen omeprazole (PRILOSEC) 40 MG capsule Take 40 mg by mouth daily.      Marland Kitchen warfarin (COUMADIN) 3 MG tablet Take 3 mg by mouth every other day. Alternate with 4 mg tablet (daily after breakfast) or as directed by Dr. Natasha Bence    . warfarin (COUMADIN) 4 MG tablet Take 4 mg by mouth every other day. Alternate with 3 mg tablet (daily after breakfast) or as directed by Dr. Natasha Bence     No current facility-administered medications for this visit.    Allergies:  Nitroglycerin   Social History: The patient  reports that he has never smoked. He has never used smokeless tobacco. He  reports that he does not drink alcohol or use drugs.   Family History: The patient's family history includes Diabetes in his unknown relative; Hypertension in his unknown relative.   ROS:  Please see the history of present illness. Otherwise, complete review of systems is positive for {NONE DEFAULTED:18576::"none"}.  All other systems are reviewed and negative.   Physical Exam: VS:  There were no vitals taken for this visit., BMI There is no height or weight on file to calculate BMI.  Wt Readings from Last 3 Encounters:  08/23/17 278 lb (126.1 kg)  02/25/17 265 lb 3.2 oz (120.3 kg)  11/23/16 269 lb (122 kg)    General: Patient appears comfortable at rest. HEENT: Conjunctiva and lids normal, oropharynx clear with moist mucosa. Neck: Supple, no elevated JVP or carotid bruits, no thyromegaly. Lungs: Clear to auscultation, nonlabored breathing at rest. Cardiac: Regular rate and rhythm, no S3 or significant systolic murmur, no pericardial rub. Abdomen: Soft, nontender, no hepatomegaly, bowel sounds present, no guarding or rebound. Extremities: No pitting edema, distal pulses 2+. Skin: Warm and dry. Musculoskeletal: No kyphosis. Neuropsychiatric: Alert and oriented x3, affect grossly appropriate.  ECG: I personally reviewed the tracing from 10/06/2016 which showed an atrial paced rhythm.  Recent Labwork: No results found for requested labs within last 8760 hours.   Other Studies Reviewed Today:  Echocardiogram 12/17/2013: Study Conclusions  - Left ventricle: The cavity size was normal. Wall thickness was increased in a pattern of mild LVH. Systolic function was normal. The estimated ejection fraction was in the range of 60% to 65%. There is hypokinesis of the basalinferior myocardium. Doppler parameters are consistent with abnormal left ventricular relaxation (grade 1 diastolic dysfunction). - Aortic valve: Mildly calcified annulus. Trileaflet; mildly thickened  leaflets. Trivial regurgitation. - Mitral valve: Calcified annulus. Mildly thickened leaflets . Mild regurgitation directed eccentrically. - Left atrium: The atrium was mildly dilated. The appendage was mildly to moderately dilated. - Right ventricle: Pacer wire or catheter noted in right ventricle. - Tricuspid valve: Mild regurgitation. Peak RV-RA gradient: 3m Hg (S). - Pulmonary arteries: Systolic pressure could not be accurately estimated. Likely mildly increased based on RV-RA gradient, assuming normal CVP. - Inferior vena cava: Not visualized. Unable toestimate CVP. - Pericardium, extracardiac: A prominent pericardial fat pad was present.  Impressions:  - Unable to compare directly with prior study March 2014. Mild LVH with LVEF 60-65%, basal inferior hypokinesis, grade 1 diastolic dysfunction. Mild left atrial enlargement. MAC with mild mitral regurgitation. Device wire noted in  the right heart. Mild tricuspid regurgitation. RV-RA gradient 38 mmHg, likely mildly increased PASP assuming normal CVP. This is significantly improved compared with prior report.  Assessment and Plan:    Current medicines were reviewed with the patient today.  No orders of the defined types were placed in this encounter.   Disposition:  Signed, Satira Sark, MD, [facility] 02/25/2018 8:39 AM    [address] [facility] [place], [place], [place] [address] Phone: [phone]; Fax: [phone]

## 2018-02-28 ENCOUNTER — Ambulatory Visit: Payer: Medicare HMO | Admitting: Cardiology

## 2018-03-11 DIAGNOSIS — N3020 Other chronic cystitis without hematuria: Secondary | ICD-10-CM | POA: Diagnosis not present

## 2018-03-11 DIAGNOSIS — Z6835 Body mass index (BMI) 35.0-35.9, adult: Secondary | ICD-10-CM | POA: Diagnosis not present

## 2018-03-11 DIAGNOSIS — Z1389 Encounter for screening for other disorder: Secondary | ICD-10-CM | POA: Diagnosis not present

## 2018-03-11 DIAGNOSIS — Z0000 Encounter for general adult medical examination without abnormal findings: Secondary | ICD-10-CM | POA: Diagnosis not present

## 2018-03-12 ENCOUNTER — Other Ambulatory Visit: Payer: Self-pay

## 2018-03-12 NOTE — Patient Outreach (Signed)
[facility] Care Management  03/12/2018  Georjean ModeJames V Almario 09/22/1935 [id number]   Transition of care  Referral date: 03/11/18 Referral source[facility] 03/08/18 [facility]  Telephone call to patient regarding transition of care follow up. Contact answering phone states she is patients wife, Lawernce Ionleanor Ozdemir. Wife listed as patients designated party release per chart.  Wife states patient was in the hospital due to having back surgery for pinched nerves. Wife states stitches has been removed and patients back has healed well.  Wife confirmed patient discharged from the skilled nursing facility on 03/08/18. Wife states patient had a follow up appointment with his primary MD on yesterday. Wife states patient has another urinary tract infection. States patients doctor prescribed antibiotic which patient is taking  Currently. Wife states the home health agency,[facility]  has contacted patient and will start services on tomorrow. Wife states patient is unable to walk at this time.Requires wheelchair and   assistance with his care and with transport. Wife states she has assistance with patients care and for transportation.  Wife states she does not know when patient is to follow up with the surgeon, Dr. Channing Muttersoy. RNCM gave patient office phone number for Dr Channing Muttersoy. Advised to call Dr. Temple Pacinioy's office and inquire if follow up is needed. Wife states patient has all of his medications and takes them as prescribed. Wife states patient handles his own medications. Patient would not review medications with RNCM. Wife stating she did not feel patient needed ongoing nursing follow up calls.  Patient will have home health follow up.  RNCM reviewed sign/symptoms of infection with patient. Advised to call doctor for these symptoms.  RNCM advised patient to notify MD of any changes in condition prior to scheduled appointment. RNCM provided contact name and  number: [phone] or main office number [phone] and 24 hour nurse advise line [phone] by mail at wife's request. .  RNCM verified patient aware of 911 services for urgent/ emergent needs.   PLAN: RNCM will close patient due to refusal of services.  RNCm will [facility]/ magnet.  RNCM will send closure notification to patients primary MD.   George InaDavina Brixton Franko RN,BSN,CCM [facility] Telephonic  [phone]   PLAN: RNCM will

## 2018-03-13 DIAGNOSIS — N183 Chronic kidney disease, stage 3 (moderate): Secondary | ICD-10-CM | POA: Diagnosis not present

## 2018-03-13 DIAGNOSIS — I5030 Unspecified diastolic (congestive) heart failure: Secondary | ICD-10-CM | POA: Diagnosis not present

## 2018-03-13 DIAGNOSIS — I2510 Atherosclerotic heart disease of native coronary artery without angina pectoris: Secondary | ICD-10-CM | POA: Diagnosis not present

## 2018-03-13 DIAGNOSIS — M5136 Other intervertebral disc degeneration, lumbar region: Secondary | ICD-10-CM | POA: Diagnosis not present

## 2018-03-13 DIAGNOSIS — E1122 Type 2 diabetes mellitus with diabetic chronic kidney disease: Secondary | ICD-10-CM | POA: Diagnosis not present

## 2018-03-13 DIAGNOSIS — M48061 Spinal stenosis, lumbar region without neurogenic claudication: Secondary | ICD-10-CM | POA: Diagnosis not present

## 2018-03-17 ENCOUNTER — Encounter: Payer: Medicare HMO | Admitting: *Deleted

## 2018-03-17 ENCOUNTER — Telehealth: Payer: Self-pay | Admitting: Cardiology

## 2018-03-17 NOTE — Telephone Encounter (Signed)
Confirmed remote transmission w/ pt wife.   

## 2018-03-18 DIAGNOSIS — I5030 Unspecified diastolic (congestive) heart failure: Secondary | ICD-10-CM | POA: Diagnosis not present

## 2018-03-18 DIAGNOSIS — E1122 Type 2 diabetes mellitus with diabetic chronic kidney disease: Secondary | ICD-10-CM | POA: Diagnosis not present

## 2018-03-18 DIAGNOSIS — M5136 Other intervertebral disc degeneration, lumbar region: Secondary | ICD-10-CM | POA: Diagnosis not present

## 2018-03-18 DIAGNOSIS — M48061 Spinal stenosis, lumbar region without neurogenic claudication: Secondary | ICD-10-CM | POA: Diagnosis not present

## 2018-03-18 DIAGNOSIS — N183 Chronic kidney disease, stage 3 (moderate): Secondary | ICD-10-CM | POA: Diagnosis not present

## 2018-03-18 DIAGNOSIS — I2510 Atherosclerotic heart disease of native coronary artery without angina pectoris: Secondary | ICD-10-CM | POA: Diagnosis not present

## 2018-03-20 ENCOUNTER — Encounter: Payer: Self-pay | Admitting: Cardiology

## 2018-03-20 DIAGNOSIS — N183 Chronic kidney disease, stage 3 (moderate): Secondary | ICD-10-CM | POA: Diagnosis not present

## 2018-03-20 DIAGNOSIS — I5030 Unspecified diastolic (congestive) heart failure: Secondary | ICD-10-CM | POA: Diagnosis not present

## 2018-03-20 DIAGNOSIS — I2510 Atherosclerotic heart disease of native coronary artery without angina pectoris: Secondary | ICD-10-CM | POA: Diagnosis not present

## 2018-03-20 DIAGNOSIS — M48061 Spinal stenosis, lumbar region without neurogenic claudication: Secondary | ICD-10-CM | POA: Diagnosis not present

## 2018-03-20 DIAGNOSIS — E1122 Type 2 diabetes mellitus with diabetic chronic kidney disease: Secondary | ICD-10-CM | POA: Diagnosis not present

## 2018-03-20 DIAGNOSIS — M5136 Other intervertebral disc degeneration, lumbar region: Secondary | ICD-10-CM | POA: Diagnosis not present

## 2018-03-24 DIAGNOSIS — M5136 Other intervertebral disc degeneration, lumbar region: Secondary | ICD-10-CM | POA: Diagnosis not present

## 2018-03-24 DIAGNOSIS — E1122 Type 2 diabetes mellitus with diabetic chronic kidney disease: Secondary | ICD-10-CM | POA: Diagnosis not present

## 2018-03-24 DIAGNOSIS — N183 Chronic kidney disease, stage 3 (moderate): Secondary | ICD-10-CM | POA: Diagnosis not present

## 2018-03-24 DIAGNOSIS — M48061 Spinal stenosis, lumbar region without neurogenic claudication: Secondary | ICD-10-CM | POA: Diagnosis not present

## 2018-03-24 DIAGNOSIS — I5030 Unspecified diastolic (congestive) heart failure: Secondary | ICD-10-CM | POA: Diagnosis not present

## 2018-03-24 DIAGNOSIS — I2510 Atherosclerotic heart disease of native coronary artery without angina pectoris: Secondary | ICD-10-CM | POA: Diagnosis not present

## 2018-03-25 DIAGNOSIS — I5030 Unspecified diastolic (congestive) heart failure: Secondary | ICD-10-CM | POA: Diagnosis not present

## 2018-03-25 DIAGNOSIS — N183 Chronic kidney disease, stage 3 (moderate): Secondary | ICD-10-CM | POA: Diagnosis not present

## 2018-03-25 DIAGNOSIS — E1122 Type 2 diabetes mellitus with diabetic chronic kidney disease: Secondary | ICD-10-CM | POA: Diagnosis not present

## 2018-03-25 DIAGNOSIS — M5136 Other intervertebral disc degeneration, lumbar region: Secondary | ICD-10-CM | POA: Diagnosis not present

## 2018-03-25 DIAGNOSIS — M48061 Spinal stenosis, lumbar region without neurogenic claudication: Secondary | ICD-10-CM | POA: Diagnosis not present

## 2018-03-25 DIAGNOSIS — I2510 Atherosclerotic heart disease of native coronary artery without angina pectoris: Secondary | ICD-10-CM | POA: Diagnosis not present

## 2018-03-27 DIAGNOSIS — I5030 Unspecified diastolic (congestive) heart failure: Secondary | ICD-10-CM | POA: Diagnosis not present

## 2018-03-27 DIAGNOSIS — I2510 Atherosclerotic heart disease of native coronary artery without angina pectoris: Secondary | ICD-10-CM | POA: Diagnosis not present

## 2018-03-27 DIAGNOSIS — M48061 Spinal stenosis, lumbar region without neurogenic claudication: Secondary | ICD-10-CM | POA: Diagnosis not present

## 2018-03-27 DIAGNOSIS — M5136 Other intervertebral disc degeneration, lumbar region: Secondary | ICD-10-CM | POA: Diagnosis not present

## 2018-03-27 DIAGNOSIS — N183 Chronic kidney disease, stage 3 (moderate): Secondary | ICD-10-CM | POA: Diagnosis not present

## 2018-03-27 DIAGNOSIS — E1122 Type 2 diabetes mellitus with diabetic chronic kidney disease: Secondary | ICD-10-CM | POA: Diagnosis not present

## 2018-04-01 DIAGNOSIS — M5136 Other intervertebral disc degeneration, lumbar region: Secondary | ICD-10-CM | POA: Diagnosis not present

## 2018-04-01 DIAGNOSIS — E1122 Type 2 diabetes mellitus with diabetic chronic kidney disease: Secondary | ICD-10-CM | POA: Diagnosis not present

## 2018-04-01 DIAGNOSIS — M48061 Spinal stenosis, lumbar region without neurogenic claudication: Secondary | ICD-10-CM | POA: Diagnosis not present

## 2018-04-01 DIAGNOSIS — N183 Chronic kidney disease, stage 3 (moderate): Secondary | ICD-10-CM | POA: Diagnosis not present

## 2018-04-01 DIAGNOSIS — I2510 Atherosclerotic heart disease of native coronary artery without angina pectoris: Secondary | ICD-10-CM | POA: Diagnosis not present

## 2018-04-01 DIAGNOSIS — I5030 Unspecified diastolic (congestive) heart failure: Secondary | ICD-10-CM | POA: Diagnosis not present

## 2018-04-03 DIAGNOSIS — M48061 Spinal stenosis, lumbar region without neurogenic claudication: Secondary | ICD-10-CM | POA: Diagnosis not present

## 2018-04-03 DIAGNOSIS — I5030 Unspecified diastolic (congestive) heart failure: Secondary | ICD-10-CM | POA: Diagnosis not present

## 2018-04-03 DIAGNOSIS — N183 Chronic kidney disease, stage 3 (moderate): Secondary | ICD-10-CM | POA: Diagnosis not present

## 2018-04-03 DIAGNOSIS — E1122 Type 2 diabetes mellitus with diabetic chronic kidney disease: Secondary | ICD-10-CM | POA: Diagnosis not present

## 2018-04-03 DIAGNOSIS — M5136 Other intervertebral disc degeneration, lumbar region: Secondary | ICD-10-CM | POA: Diagnosis not present

## 2018-04-03 DIAGNOSIS — I2510 Atherosclerotic heart disease of native coronary artery without angina pectoris: Secondary | ICD-10-CM | POA: Diagnosis not present

## 2018-04-04 DIAGNOSIS — I5030 Unspecified diastolic (congestive) heart failure: Secondary | ICD-10-CM | POA: Diagnosis not present

## 2018-04-04 DIAGNOSIS — M48061 Spinal stenosis, lumbar region without neurogenic claudication: Secondary | ICD-10-CM | POA: Diagnosis not present

## 2018-04-04 DIAGNOSIS — M5136 Other intervertebral disc degeneration, lumbar region: Secondary | ICD-10-CM | POA: Diagnosis not present

## 2018-04-04 DIAGNOSIS — N183 Chronic kidney disease, stage 3 (moderate): Secondary | ICD-10-CM | POA: Diagnosis not present

## 2018-04-04 DIAGNOSIS — I2510 Atherosclerotic heart disease of native coronary artery without angina pectoris: Secondary | ICD-10-CM | POA: Diagnosis not present

## 2018-04-04 DIAGNOSIS — E1122 Type 2 diabetes mellitus with diabetic chronic kidney disease: Secondary | ICD-10-CM | POA: Diagnosis not present

## 2018-04-07 DIAGNOSIS — I2510 Atherosclerotic heart disease of native coronary artery without angina pectoris: Secondary | ICD-10-CM | POA: Diagnosis not present

## 2018-04-07 DIAGNOSIS — I5030 Unspecified diastolic (congestive) heart failure: Secondary | ICD-10-CM | POA: Diagnosis not present

## 2018-04-07 DIAGNOSIS — M5136 Other intervertebral disc degeneration, lumbar region: Secondary | ICD-10-CM | POA: Diagnosis not present

## 2018-04-07 DIAGNOSIS — N183 Chronic kidney disease, stage 3 (moderate): Secondary | ICD-10-CM | POA: Diagnosis not present

## 2018-04-07 DIAGNOSIS — E1122 Type 2 diabetes mellitus with diabetic chronic kidney disease: Secondary | ICD-10-CM | POA: Diagnosis not present

## 2018-04-07 DIAGNOSIS — M48061 Spinal stenosis, lumbar region without neurogenic claudication: Secondary | ICD-10-CM | POA: Diagnosis not present

## 2018-04-09 ENCOUNTER — Other Ambulatory Visit: Payer: Self-pay | Admitting: Cardiology

## 2018-04-09 DIAGNOSIS — I5030 Unspecified diastolic (congestive) heart failure: Secondary | ICD-10-CM | POA: Diagnosis not present

## 2018-04-09 DIAGNOSIS — M5136 Other intervertebral disc degeneration, lumbar region: Secondary | ICD-10-CM | POA: Diagnosis not present

## 2018-04-09 DIAGNOSIS — M48061 Spinal stenosis, lumbar region without neurogenic claudication: Secondary | ICD-10-CM | POA: Diagnosis not present

## 2018-04-09 DIAGNOSIS — E1122 Type 2 diabetes mellitus with diabetic chronic kidney disease: Secondary | ICD-10-CM | POA: Diagnosis not present

## 2018-04-09 DIAGNOSIS — N183 Chronic kidney disease, stage 3 (moderate): Secondary | ICD-10-CM | POA: Diagnosis not present

## 2018-04-09 DIAGNOSIS — I2510 Atherosclerotic heart disease of native coronary artery without angina pectoris: Secondary | ICD-10-CM | POA: Diagnosis not present

## 2018-04-18 DIAGNOSIS — I5030 Unspecified diastolic (congestive) heart failure: Secondary | ICD-10-CM | POA: Diagnosis not present

## 2018-04-18 DIAGNOSIS — I2510 Atherosclerotic heart disease of native coronary artery without angina pectoris: Secondary | ICD-10-CM | POA: Diagnosis not present

## 2018-04-18 DIAGNOSIS — E1122 Type 2 diabetes mellitus with diabetic chronic kidney disease: Secondary | ICD-10-CM | POA: Diagnosis not present

## 2018-04-18 DIAGNOSIS — M48061 Spinal stenosis, lumbar region without neurogenic claudication: Secondary | ICD-10-CM | POA: Diagnosis not present

## 2018-04-18 DIAGNOSIS — N183 Chronic kidney disease, stage 3 (moderate): Secondary | ICD-10-CM | POA: Diagnosis not present

## 2018-04-18 DIAGNOSIS — M5136 Other intervertebral disc degeneration, lumbar region: Secondary | ICD-10-CM | POA: Diagnosis not present

## 2018-04-23 DIAGNOSIS — M48061 Spinal stenosis, lumbar region without neurogenic claudication: Secondary | ICD-10-CM | POA: Diagnosis not present

## 2018-04-23 DIAGNOSIS — N183 Chronic kidney disease, stage 3 (moderate): Secondary | ICD-10-CM | POA: Diagnosis not present

## 2018-04-23 DIAGNOSIS — I2510 Atherosclerotic heart disease of native coronary artery without angina pectoris: Secondary | ICD-10-CM | POA: Diagnosis not present

## 2018-04-23 DIAGNOSIS — I5030 Unspecified diastolic (congestive) heart failure: Secondary | ICD-10-CM | POA: Diagnosis not present

## 2018-04-23 DIAGNOSIS — E1122 Type 2 diabetes mellitus with diabetic chronic kidney disease: Secondary | ICD-10-CM | POA: Diagnosis not present

## 2018-04-23 DIAGNOSIS — M5136 Other intervertebral disc degeneration, lumbar region: Secondary | ICD-10-CM | POA: Diagnosis not present

## 2018-05-08 DIAGNOSIS — Z0000 Encounter for general adult medical examination without abnormal findings: Secondary | ICD-10-CM | POA: Diagnosis not present

## 2018-05-08 DIAGNOSIS — E782 Mixed hyperlipidemia: Secondary | ICD-10-CM | POA: Diagnosis not present

## 2018-05-08 DIAGNOSIS — Z6833 Body mass index (BMI) 33.0-33.9, adult: Secondary | ICD-10-CM | POA: Diagnosis not present

## 2018-05-08 DIAGNOSIS — Z6835 Body mass index (BMI) 35.0-35.9, adult: Secondary | ICD-10-CM | POA: Diagnosis not present

## 2018-05-08 DIAGNOSIS — Z7901 Long term (current) use of anticoagulants: Secondary | ICD-10-CM | POA: Diagnosis not present

## 2018-05-08 DIAGNOSIS — Z1389 Encounter for screening for other disorder: Secondary | ICD-10-CM | POA: Diagnosis not present

## 2018-05-08 DIAGNOSIS — S0001XA Abrasion of scalp, initial encounter: Secondary | ICD-10-CM | POA: Diagnosis not present

## 2018-05-08 DIAGNOSIS — N3020 Other chronic cystitis without hematuria: Secondary | ICD-10-CM | POA: Diagnosis not present

## 2018-05-14 ENCOUNTER — Telehealth: Payer: Self-pay

## 2018-05-14 ENCOUNTER — Encounter: Payer: Medicare HMO | Admitting: *Deleted

## 2018-05-14 NOTE — Telephone Encounter (Signed)
Confirmed remote transmission w/ pt wife.   

## 2018-05-15 ENCOUNTER — Encounter: Payer: Self-pay | Admitting: Cardiology

## 2018-05-15 NOTE — Progress Notes (Signed)
Letter  

## 2018-05-26 ENCOUNTER — Ambulatory Visit (INDEPENDENT_AMBULATORY_CARE_PROVIDER_SITE_OTHER): Payer: Medicare HMO | Admitting: *Deleted

## 2018-05-26 DIAGNOSIS — I495 Sick sinus syndrome: Secondary | ICD-10-CM

## 2018-05-26 LAB — CUP PACEART REMOTE DEVICE CHECK
Battery Remaining Longevity: 113 mo
Date Time Interrogation Session: 20190708120642
Implantable Lead Implant Date: 20060828
Implantable Lead Location: 753859
Implantable Lead Location: 753860
Implantable Pulse Generator Implant Date: 20160906
Lead Channel Impedance Value: 380 Ohm
Lead Channel Pacing Threshold Amplitude: 1.625 V
Lead Channel Pacing Threshold Pulse Width: 0.4 ms
Lead Channel Setting Pacing Amplitude: 2 V
MDC IDC LEAD IMPLANT DT: 20060828
MDC IDC MSMT BATTERY IMPEDANCE: 183 Ohm
MDC IDC MSMT BATTERY VOLTAGE: 2.79 V
MDC IDC MSMT LEADCHNL RA PACING THRESHOLD AMPLITUDE: 0.875 V
MDC IDC MSMT LEADCHNL RA PACING THRESHOLD PULSEWIDTH: 0.4 ms
MDC IDC MSMT LEADCHNL RV IMPEDANCE VALUE: 509 Ohm
MDC IDC SET LEADCHNL RV PACING AMPLITUDE: 3.25 V
MDC IDC SET LEADCHNL RV PACING PULSEWIDTH: 0.4 ms
MDC IDC SET LEADCHNL RV SENSING SENSITIVITY: 2 mV
MDC IDC STAT BRADY AP VP PERCENT: 33 %
MDC IDC STAT BRADY AP VS PERCENT: 14 %
MDC IDC STAT BRADY AS VP PERCENT: 25 %
MDC IDC STAT BRADY AS VS PERCENT: 28 %

## 2018-05-27 NOTE — Progress Notes (Signed)
Remote pacemaker transmission.   

## 2018-06-11 ENCOUNTER — Other Ambulatory Visit: Payer: Self-pay | Admitting: Cardiology

## 2018-08-11 DIAGNOSIS — A0839 Other viral enteritis: Secondary | ICD-10-CM | POA: Diagnosis not present

## 2018-08-11 DIAGNOSIS — I10 Essential (primary) hypertension: Secondary | ICD-10-CM | POA: Diagnosis not present

## 2018-08-11 DIAGNOSIS — E119 Type 2 diabetes mellitus without complications: Secondary | ICD-10-CM | POA: Diagnosis not present

## 2018-08-11 DIAGNOSIS — M10262 Drug-induced gout, left knee: Secondary | ICD-10-CM | POA: Diagnosis not present

## 2018-08-11 DIAGNOSIS — N182 Chronic kidney disease, stage 2 (mild): Secondary | ICD-10-CM | POA: Diagnosis not present

## 2018-08-11 DIAGNOSIS — Z6835 Body mass index (BMI) 35.0-35.9, adult: Secondary | ICD-10-CM | POA: Diagnosis not present

## 2018-08-11 DIAGNOSIS — K210 Gastro-esophageal reflux disease with esophagitis: Secondary | ICD-10-CM | POA: Diagnosis not present

## 2018-08-13 DIAGNOSIS — R197 Diarrhea, unspecified: Secondary | ICD-10-CM | POA: Diagnosis not present

## 2018-08-20 ENCOUNTER — Encounter: Payer: Self-pay | Admitting: *Deleted

## 2018-08-22 ENCOUNTER — Ambulatory Visit (HOSPITAL_COMMUNITY)
Admission: RE | Admit: 2018-08-22 | Discharge: 2018-08-22 | Disposition: A | Discharge status: Home / Self Care | Payer: Medicare HMO | Source: Ambulatory Visit | Attending: Internal Medicine | Admitting: Internal Medicine

## 2018-08-22 ENCOUNTER — Encounter: Payer: Self-pay | Admitting: Internal Medicine

## 2018-08-22 ENCOUNTER — Telehealth: Payer: Self-pay | Admitting: Internal Medicine

## 2018-08-22 ENCOUNTER — Ambulatory Visit (INDEPENDENT_AMBULATORY_CARE_PROVIDER_SITE_OTHER): Payer: Medicare HMO | Admitting: Internal Medicine

## 2018-08-22 ENCOUNTER — Other Ambulatory Visit: Payer: Self-pay | Admitting: Cardiology

## 2018-08-22 VITALS — BP 144/90 | HR 83 | Ht 72.0 in | Wt 276.0 lb

## 2018-08-22 DIAGNOSIS — I495 Sick sinus syndrome: Secondary | ICD-10-CM | POA: Diagnosis not present

## 2018-08-22 DIAGNOSIS — W19XXXA Unspecified fall, initial encounter: Secondary | ICD-10-CM | POA: Diagnosis not present

## 2018-08-22 DIAGNOSIS — I10 Essential (primary) hypertension: Secondary | ICD-10-CM

## 2018-08-22 DIAGNOSIS — Z950 Presence of cardiac pacemaker: Secondary | ICD-10-CM | POA: Insufficient documentation

## 2018-08-22 DIAGNOSIS — J449 Chronic obstructive pulmonary disease, unspecified: Secondary | ICD-10-CM | POA: Diagnosis not present

## 2018-08-22 DIAGNOSIS — Z23 Encounter for immunization: Secondary | ICD-10-CM | POA: Diagnosis not present

## 2018-08-22 DIAGNOSIS — Z951 Presence of aortocoronary bypass graft: Secondary | ICD-10-CM | POA: Diagnosis not present

## 2018-08-22 NOTE — Progress Notes (Signed)
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 male who presents today for routine electrophysiology followup.  Since last being seen in our clinic, the patient reports doing reasonably well. He is chronically weak and frail.  SOB is stable.  He continues to be unsteady.   He denies presyncope or syncope but continues to have falls.  He fell 2 days ago and his his head.  Denies N/V, confusion or neuro changes but does have a headache. Today, he denies symptoms of palpitations, chest pain.  + stable edema. The patient is otherwise without complaint today.   Past Medical History:  Diagnosis Date  . Carotid sinus syndrome    Status post pacemaker  . Coronary atherosclerosis of native coronary artery    DES to PL 2006, mild to moderate residual disease 2010   . Essential hypertension, benign   . Mixed hyperlipidemia   . Obstructive sleep apnea    CPAP  . Pacemaker 1986   Medtronic  . Pulmonary emboli [facility])    Diagnosed 2012 and 2014, IVC filter March 2014, recurrent March 2014 - thromboembolic therapy at [facility] and placed back on anticoagulation  . Pulmonary hypertension (HCC)    Severe, RVSP 85-90 mmHg March 2014   . Stroke (HCC) 11/2012  . Subdural hematoma [facility])    January 2014, occurred on Coumadin  . Type 2 diabetes mellitus (HCC)    Past Surgical History:  Procedure Laterality Date  . arm surgery Right   . CATARACT EXTRACTION W/PHACO Right 02/18/2014   Procedure: CATARACT EXTRACTION PHACO AND INTRAOCULAR LENS PLACEMENT (IOC);  Surgeon: Gemma Payor, MD;  Location: [facility]S;  Service: Ophthalmology;  Laterality: Right;  CDE: 11.83  . CATARACT EXTRACTION W/PHACO Left 03/25/2014   Procedure: CATARACT EXTRACTION PHACO AND INTRAOCULAR LENS PLACEMENT (IOC);  Surgeon: Gemma Payor, MD;  Location: [facility];  Service: Ophthalmology;  Laterality: Left;  CDE 15.93  . CHOLECYSTECTOMY  2011  . EP IMPLANTABLE DEVICE N/A 07/26/2015   MDT  Adapta L gen change by Dr Johney Frame  . FILTERING PROCEDURE  11/2012   placed due to blood clots from the lungs  . PACEMAKER IMPLANTED   07/16/2005   Medtronic Kappa 900 DR    ROS- all systems are reviewed and negative except as per HPI above  Current Outpatient Medications  Medication Sig Dispense Refill  . allopurinol (ZYLOPRIM) 300 MG tablet Take 300 mg by mouth daily after breakfast.     . aspirin EC 81 MG tablet Take 81 mg by mouth daily after breakfast.    . atorvastatin (LIPITOR) 40 MG tablet Take 40 mg by mouth daily after breakfast.     . carvedilol (COREG) 6.25 MG tablet Take 1 tablet (6.25 mg total) by mouth 2 (two) times daily with a meal. 180 tablet 3  . cephALEXin (KEFLEX) 500 MG capsule Take 500 mg by mouth 3 (three) times daily.    . citalopram (CELEXA) 20 MG tablet Take 20 mg by mouth daily.  0  . colchicine 0.6 MG tablet Take 0.6 mg by mouth daily as needed (gout attacks).     . fenofibrate (TRICOR) 145 MG tablet Take 145 mg by mouth daily.      . ferrous sulfate 325 (65 FE) MG tablet Take 325 mg by mouth 3 (three) times daily with meals.    . furosemide (LASIX) 20 MG tablet Take 20 mg by mouth daily.     Marland Kitchen  gemfibrozil (LOPID) 600 MG tablet Take 600 mg by mouth daily.    Marland Kitchen glimepiride (AMARYL) 2 MG tablet Take 2 mg by mouth 2 (two) times daily.     Marland Kitchen levETIRAcetam (KEPPRA) 750 MG tablet Take 750 mg by mouth 2 (two) times daily.     Marland Kitchen losartan (COZAAR) 25 MG tablet TAKE 1 TABLET EVERY DAY  (DOSE  DECREASE) 30 tablet 0  . metFORMIN (GLUCOPHAGE) 1000 MG tablet Take 1,000 mg by mouth 2 (two) times daily with a meal.    . Omega-3 Fatty Acids (FISH OIL) 1000 MG CAPS Take 1 capsule by mouth 3 (three) times daily.    Marland Kitchen omeprazole (PRILOSEC) 40 MG capsule Take 40 mg by mouth daily.      Marland Kitchen warfarin (COUMADIN) 3 MG tablet Take 3 mg by mouth every other day. Alternate with 4 mg tablet (daily after breakfast) or as directed by Dr. Jeronimo Greaves    . warfarin (COUMADIN) 4 MG tablet Take 4 mg by  mouth every other day. Alternate with 3 mg tablet (daily after breakfast) or as directed by Dr. Jeronimo Greaves     No current facility-administered medications for this visit.     Physical Exam: Vitals:   08/22/18 0835  BP: (!) 144/90  Pulse: 83  SpO2: 93%  Weight: 276 lb (125.2 kg)  Height: 6' (1.829 m)    GEN- The patient is well appearing, alert and oriented x 3 today.   Head- ecchymosis over R forehead Eyes-  Sclera clear, conjunctiva pink Ears- hearing intact Oropharynx- clear Lungs- Clear to ausculation bilaterally, normal work of breathing Chest- pacemaker pocket is well healed Heart- Regular rate and rhythm, no murmurs, rubs or gallops, PMI not laterally displaced GI- soft, NT, ND, + BS Extremities- no clubbing, cyanosis, + dependant edema  Pacemaker interrogation- reviewed in detail today,  See PACEART report   Assessment and Plan:  1. Symptomatic sinus bradycardia  RV lead sensing has reduced (R waves now 1.4) causing inappropriate V pacing.  Today, unipolar sensing reveals R waves of 5-6.  No noise above 2 mV.  I have therefore programmed sensing to unipolar with sensing at 2.8 mV.  Also extended AV delays to minimize V pacing.  Could consider AAIR if his lead fails further as his implant indication has been sick sinus syndrome. CXR today to evaluate his lead. See Pace Art report No changes today  2. Obesity Body mass index is 37.43 kg/m. Lifestyle modification encouraged  3. Atach/ SVT Well controlled <1%  3. Prior PTE Would continue anticoagulation long term  4. CAD No ischemic symptoms Given falls, may consider stopping ASA as he is on warfarin.  I will defer to Dr Diona Browner  5. S/p fall Given head trauma in elderly patient on ASA and warfarin with persistent headache, I will order a noncontrast head CT today.  I have encouraged him to follow-up with Dr Damita Lack also as he may benefit from hhpt or other mechanisms to prevent fall.  6. HTN Stable No change  required today   carelink Return in a year  Hillis Range MD, [facility] 08/22/2018 8:56 AM

## 2018-08-22 NOTE — Telephone Encounter (Signed)
Pre-cert Verification for the following procedure   CT HEAD W/O CONTRAST scheduled for 09-08-2018 at [facility]

## 2018-08-22 NOTE — Patient Instructions (Signed)
Medication Instructions:  Continue all current medications.  Labwork: none  Testing/Procedures:  A chest x-ray takes a picture of the organs and structures inside the chest, including the heart, lungs, and blood vessels. This test can show several things, including, whether the heart is enlarges; whether fluid is building up in the lungs; and whether pacemaker / defibrillator leads are still in place.  Noncontrast head CT.    Office will contact with results via phone or letter.    Follow-Up:  Your physician wants you to follow up in:  1 year.  You will receive a reminder letter in the mail one-two months in advance.  If you don't receive a letter, please call our office to schedule the follow up appointment - Dr. Johney Frame.   Remote monitoring is used to monitor your Pacemaker of ICD from home. This monitoring reduces the number of office visits required to check your device to one time per year. It allows [place] to keep an eye on the functioning of your device to ensure it is working properly. You are scheduled for a device check from home on 08/25/2018. You may send your transmission at any time that day. If you have a wireless device, the transmission will be sent automatically. After your physician reviews your transmission, you will receive a postcard with your next transmission date.    Any Other Special Instructions Will Be Listed Below (If Applicable).  If you need a refill on your cardiac medications before your next appointment, please call your pharmacy.

## 2018-08-24 LAB — CUP PACEART INCLINIC DEVICE CHECK
Battery Remaining Longevity: 93 mo
Brady Statistic AP VP Percent: 35 %
Brady Statistic AP VS Percent: 13 %
Implantable Lead Implant Date: 20060828
Implantable Lead Location: 753860
Implantable Lead Model: 5076
Implantable Pulse Generator Implant Date: 20160906
Lead Channel Impedance Value: 362 Ohm
Lead Channel Impedance Value: 484 Ohm
Lead Channel Pacing Threshold Amplitude: 1 V
Lead Channel Pacing Threshold Amplitude: 1.25 V
Lead Channel Sensing Intrinsic Amplitude: 2.8 mV
Lead Channel Setting Pacing Amplitude: 2.5 V
Lead Channel Setting Pacing Pulse Width: 0.52 ms
MDC IDC LEAD IMPLANT DT: 20060828
MDC IDC LEAD LOCATION: 753859
MDC IDC MSMT BATTERY IMPEDANCE: 183 Ohm
MDC IDC MSMT BATTERY VOLTAGE: 2.78 V
MDC IDC MSMT LEADCHNL RA PACING THRESHOLD PULSEWIDTH: 0.4 ms
MDC IDC MSMT LEADCHNL RV PACING THRESHOLD PULSEWIDTH: 0.52 ms
MDC IDC MSMT LEADCHNL RV SENSING INTR AMPL: 5.6 mV
MDC IDC SESS DTM: 20191004131025
MDC IDC SET LEADCHNL RA PACING AMPLITUDE: 2 V
MDC IDC SET LEADCHNL RV SENSING SENSITIVITY: 2.8 mV
MDC IDC STAT BRADY AS VP PERCENT: 25 %
MDC IDC STAT BRADY AS VS PERCENT: 27 %

## 2018-08-25 ENCOUNTER — Ambulatory Visit (INDEPENDENT_AMBULATORY_CARE_PROVIDER_SITE_OTHER): Payer: Medicare HMO | Admitting: *Deleted

## 2018-08-25 DIAGNOSIS — I495 Sick sinus syndrome: Secondary | ICD-10-CM

## 2018-08-25 DIAGNOSIS — G9001 Carotid sinus syncope: Secondary | ICD-10-CM

## 2018-08-25 NOTE — Progress Notes (Signed)
Remote pacemaker transmission.   

## 2018-09-03 ENCOUNTER — Telehealth: Payer: Self-pay | Admitting: *Deleted

## 2018-09-03 ENCOUNTER — Encounter: Payer: Self-pay | Admitting: Cardiology

## 2018-09-03 NOTE — Telephone Encounter (Signed)
Notes recorded by Lesle Chris, LPN on 16/08/9603 at 9:03 AM EDT Wife Vena Austria) notified. Copy to pmd. ------  Notes recorded by Hillis Range, MD on 08/31/2018 at 8:02 PM EDT Results reviewed. Boneta Lucks, please inform pt of result. No obvious lead abnormality noted

## 2018-09-04 DIAGNOSIS — E1122 Type 2 diabetes mellitus with diabetic chronic kidney disease: Secondary | ICD-10-CM | POA: Diagnosis not present

## 2018-09-04 DIAGNOSIS — R296 Repeated falls: Secondary | ICD-10-CM | POA: Diagnosis not present

## 2018-09-04 DIAGNOSIS — R197 Diarrhea, unspecified: Secondary | ICD-10-CM | POA: Diagnosis not present

## 2018-09-04 DIAGNOSIS — W1839XA Other fall on same level, initial encounter: Secondary | ICD-10-CM | POA: Diagnosis not present

## 2018-09-04 DIAGNOSIS — Z86711 Personal history of pulmonary embolism: Secondary | ICD-10-CM | POA: Diagnosis not present

## 2018-09-04 DIAGNOSIS — N189 Chronic kidney disease, unspecified: Secondary | ICD-10-CM | POA: Diagnosis not present

## 2018-09-04 DIAGNOSIS — W19XXXA Unspecified fall, initial encounter: Secondary | ICD-10-CM | POA: Diagnosis not present

## 2018-09-04 DIAGNOSIS — J9811 Atelectasis: Secondary | ICD-10-CM | POA: Diagnosis not present

## 2018-09-04 DIAGNOSIS — Z86718 Personal history of other venous thrombosis and embolism: Secondary | ICD-10-CM | POA: Diagnosis not present

## 2018-09-04 DIAGNOSIS — I2510 Atherosclerotic heart disease of native coronary artery without angina pectoris: Secondary | ICD-10-CM | POA: Diagnosis not present

## 2018-09-04 DIAGNOSIS — R51 Headache: Secondary | ICD-10-CM | POA: Diagnosis not present

## 2018-09-04 DIAGNOSIS — R42 Dizziness and giddiness: Secondary | ICD-10-CM | POA: Diagnosis not present

## 2018-09-04 DIAGNOSIS — I129 Hypertensive chronic kidney disease with stage 1 through stage 4 chronic kidney disease, or unspecified chronic kidney disease: Secondary | ICD-10-CM | POA: Diagnosis not present

## 2018-09-08 ENCOUNTER — Ambulatory Visit (HOSPITAL_COMMUNITY): Admission: RE | Admit: 2018-09-08 | Payer: Medicare HMO | Source: Ambulatory Visit

## 2018-09-09 ENCOUNTER — Encounter: Payer: Self-pay | Admitting: *Deleted

## 2018-09-09 NOTE — Progress Notes (Signed)
Cardiology Office Note  Date: 09/10/2018   ID: [name], DOB May 07, 1935, MRN [id number]  PCP: Neale Burly, MD  Primary Cardiologist: Rozann Lesches, MD   Chief Complaint  Patient presents with  . Coronary Artery Disease    History of Present Illness: [name] is an 83 y.o. male last seen in October 2018.  He is here today for a follow-up visit.  He tells me that he has been having frequent falls recently despite using a cane and walker.  This typically occurs when he is standing, he does not lose consciousness but feels lightheaded and falls to the floor, weakness in his legs.  He was seen in the ER at [facility] reportedly with reassuring head CT and other work-up, we are requesting the results.  He has been prescribed meclizine by his PCP.  Blood pressure is low normal today, I reviewed his medications and also asked him to stop Cozaar for now.  He sees Dr. Rayann Heman in the device clinic, Medtronic pacemaker in place.  Recent visit in October noted.  He has had normal device function.  He continues on Coumadin with follow-up per Dr. Sherrie Sport with a history of pulmonary emboli.  Past Medical History:  Diagnosis Date  . Carotid sinus syndrome    Status post pacemaker  . Coronary atherosclerosis of native coronary artery    DES to PL 2006, mild to moderate residual disease 2010   . Essential hypertension, benign   . Mixed hyperlipidemia   . Obstructive sleep apnea    CPAP  . Pacemaker 1986   Medtronic  . Pulmonary emboli [facility])    Diagnosed 2012 and 2014, IVC filter March 2014, recurrent March 4782 - thromboembolic therapy at [facility] and placed back on anticoagulation  . Pulmonary hypertension (HCC)    Severe, RVSP 85-90 mmHg March 2014   . Stroke ([place]) 11/2012  . Subdural hematoma [facility])    January 2014, occurred on Coumadin  . Type 2 diabetes mellitus ([place])     Past Surgical History:  Procedure Laterality Date  . arm surgery Right   . CATARACT  EXTRACTION W/PHACO Right 02/18/2014   Procedure: CATARACT EXTRACTION PHACO AND INTRAOCULAR LENS PLACEMENT (IOC);  Surgeon: Tonny Branch, MD;  Location: [facility];  Service: Ophthalmology;  Laterality: Right;  CDE: 11.83  . CATARACT EXTRACTION W/PHACO Left 03/25/2014   Procedure: CATARACT EXTRACTION PHACO AND INTRAOCULAR LENS PLACEMENT (IOC);  Surgeon: Tonny Branch, MD;  Location: [facility];  Service: Ophthalmology;  Laterality: Left;  CDE 15.93  . CHOLECYSTECTOMY  2011  . EP IMPLANTABLE DEVICE N/A 07/26/2015   MDT Adapta L gen change by Dr Rayann Heman  . FILTERING PROCEDURE  11/2012   placed due to blood clots from the lungs  . PACEMAKER IMPLANTED   07/16/2005   Medtronic Kappa 900 DR    Current Outpatient Medications  Medication Sig Dispense Refill  . allopurinol (ZYLOPRIM) 300 MG tablet Take 300 mg by mouth daily after breakfast.     . aspirin EC 81 MG tablet Take 81 mg by mouth daily after breakfast.    . atorvastatin (LIPITOR) 40 MG tablet Take 40 mg by mouth daily after breakfast.     . carvedilol (COREG) 6.25 MG tablet Take 1 tablet (6.25 mg total) by mouth 2 (two) times daily with a meal. 180 tablet 3  . citalopram (CELEXA) 20 MG tablet Take 20 mg by mouth daily.  0  . colchicine 0.6 MG tablet Take 0.6  mg by mouth daily as needed (gout attacks).     . fenofibrate (TRICOR) 145 MG tablet Take 145 mg by mouth daily.      . ferrous sulfate 325 (65 FE) MG tablet Take 325 mg by mouth 3 (three) times daily with meals.    . furosemide (LASIX) 20 MG tablet Take 20 mg by mouth daily.     Marland Kitchen gemfibrozil (LOPID) 600 MG tablet Take 600 mg by mouth daily.    Marland Kitchen glimepiride (AMARYL) 2 MG tablet Take 2 mg by mouth 2 (two) times daily.     Marland Kitchen levETIRAcetam (KEPPRA) 750 MG tablet Take 750 mg by mouth 2 (two) times daily.     . meclizine (ANTIVERT) 25 MG tablet Take 25 mg by mouth 3 (three) times daily as needed for dizziness.    . metFORMIN (GLUCOPHAGE) 1000 MG tablet Take 1,000 mg by mouth 2 (two) times daily with a meal.     . Omega-3 Fatty Acids (FISH OIL) 1000 MG CAPS Take 1 capsule by mouth 3 (three) times daily.    Marland Kitchen omeprazole (PRILOSEC) 40 MG capsule Take 40 mg by mouth daily.      Marland Kitchen warfarin (COUMADIN) 3 MG tablet Take 3 mg by mouth every other day. Alternate with 4 mg tablet (daily after breakfast) or as directed by Dr. Natasha Bence    . warfarin (COUMADIN) 4 MG tablet Take 4 mg by mouth every other day. Alternate with 3 mg tablet (daily after breakfast) or as directed by Dr. Natasha Bence     No current facility-administered medications for this visit.    Allergies:  Nitroglycerin   Social History: The patient  reports that he has never smoked. He has never used smokeless tobacco. He reports that he does not drink alcohol or use drugs.   ROS:  Please see the history of present illness. Otherwise, complete review of systems is positive for hearing loss.  All other systems are reviewed and negative.   Physical Exam: VS:  BP (!) 102/52   Pulse (!) 57   Ht _0  (1.854 m)   Wt 268 lb (121.6 kg)   SpO2 97%   BMI 35.36 kg/m , BMI Body mass index is 35.36 kg/m.  Wt Readings from Last 3 Encounters:  09/10/18 268 lb (121.6 kg)  08/22/18 276 lb (125.2 kg)  08/23/17 278 lb (126.1 kg)    General: Obese male, appears comfortable at rest.  Using a walker. HEENT: Conjunctiva and lids normal, oropharynx clear. Neck: Supple, no elevated JVP or carotid bruits, no thyromegaly. Lungs: Clear to auscultation, nonlabored breathing at rest. Cardiac: Regular rate and rhythm, no S3, soft systolic murmur. Abdomen: Obese, nontender, bowel sounds present. Extremities: Chronic appearing leg edema and venous stasis, distal pulses 1-2+. Skin: Warm and dry. Musculoskeletal: No kyphosis. Neuropsychiatric: Alert and oriented x3, affect grossly appropriate.  ECG: I personally reviewed the tracing from 10/06/2016 which showed an atrial paced rhythm.  Recent Labwork:  November 2017: BUN 20, creatinine 1.1, potassium 3.3, AST 49, ALT  31, troponin T negative 3, hemoglobin 12.0, platelets 238  Other Studies Reviewed Today:  Echocardiogram 12/17/2013: Study Conclusions  - Left ventricle: The cavity size was normal. Wall thickness was increased in a pattern of mild LVH. Systolic function was normal. The estimated ejection fraction was in the range of 60% to 65%. There is hypokinesis of the basalinferior myocardium. Doppler parameters are consistent with abnormal left ventricular relaxation (grade 1 diastolic dysfunction). - Aortic valve: Mildly calcified annulus. Trileaflet;  mildly thickened leaflets. Trivial regurgitation. - Mitral valve: Calcified annulus. Mildly thickened leaflets . Mild regurgitation directed eccentrically. - Left atrium: The atrium was mildly dilated. The appendage was mildly to moderately dilated. - Right ventricle: Pacer wire or catheter noted in right ventricle. - Tricuspid valve: Mild regurgitation. Peak RV-RA gradient: 69m Hg (S). - Pulmonary arteries: Systolic pressure could not be accurately estimated. Likely mildly increased based on RV-RA gradient, assuming normal CVP. - Inferior vena cava: Not visualized. Unable toestimate CVP. - Pericardium, extracardiac: A prominent pericardial fat pad was present.  Impressions:  - Unable to compare directly with prior study March 2014. Mild LVH with LVEF 60-65%, basal inferior hypokinesis, grade 1 diastolic dysfunction. Mild left atrial enlargement. MAC with mild mitral regurgitation. Device wire noted in the right heart. Mild tricuspid regurgitation. RV-RA gradient 38 mmHg, likely mildly increased PASP assuming normal CVP. This is significantly improved compared with prior report.  Assessment and Plan:  1.  Frequent falls without syncope.  [facility].  He has been prescribed meclizine by his  PCP.  I also asked him to stop Cozaar given low normal blood pressure today in case there is an orthostatic component.  He may need further PT evaluation.  Keep follow-up with PCP.  2.  CAD status post DES to the PL in 2006.  He reports no angina symptoms.  Continue statin therapy.  3.  History of recurrent pulmonary emboli, on Coumadin per PCP.  4.  Sick sinus syndrome with Medtronic pacemaker in place, follows with Dr. ARayann Heman  Recent device interrogation showed normal function.  Current medicines were reviewed with the patient today.   Orders Placed This Encounter  Procedures  . EKG 12-Lead    Disposition: Follow-up in 6 months.  Signed, SSatira Sark MD, [facility]10/23/2019 3:53 PM    [place] [address]Phone: [phone] Fax: [phone]

## 2018-09-10 ENCOUNTER — Encounter: Payer: Self-pay | Admitting: *Deleted

## 2018-09-10 ENCOUNTER — Ambulatory Visit (INDEPENDENT_AMBULATORY_CARE_PROVIDER_SITE_OTHER): Payer: Medicare HMO | Admitting: Cardiology

## 2018-09-10 ENCOUNTER — Encounter: Payer: Self-pay | Admitting: Cardiology

## 2018-09-10 VITALS — BP 102/52 | HR 57 | Ht 73.0 in | Wt 268.0 lb

## 2018-09-10 DIAGNOSIS — R296 Repeated falls: Secondary | ICD-10-CM

## 2018-09-10 DIAGNOSIS — I495 Sick sinus syndrome: Secondary | ICD-10-CM | POA: Diagnosis not present

## 2018-09-10 DIAGNOSIS — I2510 Atherosclerotic heart disease of native coronary artery without angina pectoris: Secondary | ICD-10-CM

## 2018-09-10 DIAGNOSIS — Z950 Presence of cardiac pacemaker: Secondary | ICD-10-CM

## 2018-09-10 DIAGNOSIS — I2699 Other pulmonary embolism without acute cor pulmonale: Secondary | ICD-10-CM

## 2018-09-10 NOTE — Patient Instructions (Addendum)
Medication Instructions:   Your physician has recommended you make the following change in your medication:   Stop losartan.  Continue all other medications the same.  Labwork:  NONE  Testing/Procedures:  NONE  Follow-Up:  Your physician recommends that you schedule a follow-up appointment in: 6 months. You will receive a reminder letter in the mail in about 4 months reminding you to call and schedule your appointment. If you don't receive this letter, please contact our office.  Any Other Special Instructions Will Be Listed Below (If Applicable).  Please follow up with your family doctor (Hasanj) very soon.  If you need a refill on your cardiac medications before your next appointment, please call your pharmacy.

## 2018-09-13 DIAGNOSIS — N183 Chronic kidney disease, stage 3 (moderate): Secondary | ICD-10-CM | POA: Diagnosis not present

## 2018-09-13 DIAGNOSIS — E11649 Type 2 diabetes mellitus with hypoglycemia without coma: Secondary | ICD-10-CM | POA: Diagnosis not present

## 2018-09-13 DIAGNOSIS — E1122 Type 2 diabetes mellitus with diabetic chronic kidney disease: Secondary | ICD-10-CM | POA: Diagnosis not present

## 2018-09-13 DIAGNOSIS — I130 Hypertensive heart and chronic kidney disease with heart failure and stage 1 through stage 4 chronic kidney disease, or unspecified chronic kidney disease: Secondary | ICD-10-CM | POA: Diagnosis not present

## 2018-09-13 DIAGNOSIS — I5032 Chronic diastolic (congestive) heart failure: Secondary | ICD-10-CM | POA: Diagnosis not present

## 2018-09-13 DIAGNOSIS — R55 Syncope and collapse: Secondary | ICD-10-CM | POA: Diagnosis not present

## 2018-09-13 DIAGNOSIS — Z86711 Personal history of pulmonary embolism: Secondary | ICD-10-CM | POA: Diagnosis not present

## 2018-09-13 DIAGNOSIS — Z8679 Personal history of other diseases of the circulatory system: Secondary | ICD-10-CM | POA: Diagnosis not present

## 2018-09-13 DIAGNOSIS — E785 Hyperlipidemia, unspecified: Secondary | ICD-10-CM | POA: Diagnosis not present

## 2018-09-13 DIAGNOSIS — R42 Dizziness and giddiness: Secondary | ICD-10-CM | POA: Diagnosis not present

## 2018-09-13 DIAGNOSIS — I959 Hypotension, unspecified: Secondary | ICD-10-CM | POA: Diagnosis not present

## 2018-09-14 DIAGNOSIS — Z86711 Personal history of pulmonary embolism: Secondary | ICD-10-CM | POA: Diagnosis not present

## 2018-09-14 DIAGNOSIS — R42 Dizziness and giddiness: Secondary | ICD-10-CM | POA: Diagnosis not present

## 2018-09-14 DIAGNOSIS — E1122 Type 2 diabetes mellitus with diabetic chronic kidney disease: Secondary | ICD-10-CM | POA: Diagnosis not present

## 2018-09-14 DIAGNOSIS — R55 Syncope and collapse: Secondary | ICD-10-CM | POA: Diagnosis not present

## 2018-09-14 DIAGNOSIS — I130 Hypertensive heart and chronic kidney disease with heart failure and stage 1 through stage 4 chronic kidney disease, or unspecified chronic kidney disease: Secondary | ICD-10-CM | POA: Diagnosis not present

## 2018-09-15 DIAGNOSIS — E1122 Type 2 diabetes mellitus with diabetic chronic kidney disease: Secondary | ICD-10-CM | POA: Diagnosis not present

## 2018-09-15 DIAGNOSIS — R55 Syncope and collapse: Secondary | ICD-10-CM | POA: Diagnosis not present

## 2018-09-15 DIAGNOSIS — Z86711 Personal history of pulmonary embolism: Secondary | ICD-10-CM | POA: Diagnosis not present

## 2018-09-15 DIAGNOSIS — R42 Dizziness and giddiness: Secondary | ICD-10-CM | POA: Diagnosis not present

## 2018-09-15 DIAGNOSIS — I130 Hypertensive heart and chronic kidney disease with heart failure and stage 1 through stage 4 chronic kidney disease, or unspecified chronic kidney disease: Secondary | ICD-10-CM | POA: Diagnosis not present

## 2018-09-17 ENCOUNTER — Other Ambulatory Visit: Payer: Self-pay | Admitting: Cardiology

## 2018-09-17 LAB — CUP PACEART REMOTE DEVICE CHECK
Battery Remaining Longevity: 118 mo
Battery Voltage: 2.79 V
Brady Statistic AP VS Percent: 66 %
Date Time Interrogation Session: 20191007152336
Implantable Lead Implant Date: 20060828
Implantable Lead Location: 753859
Implantable Lead Location: 753860
Implantable Pulse Generator Implant Date: 20160906
Lead Channel Pacing Threshold Amplitude: 0.875 V
Lead Channel Pacing Threshold Amplitude: 2.25 V
Lead Channel Pacing Threshold Pulse Width: 0.4 ms
Lead Channel Pacing Threshold Pulse Width: 0.4 ms
Lead Channel Setting Pacing Amplitude: 2 V
Lead Channel Setting Pacing Pulse Width: 0.52 ms
Lead Channel Setting Sensing Sensitivity: 2.8 mV
MDC IDC LEAD IMPLANT DT: 20060828
MDC IDC MSMT BATTERY IMPEDANCE: 207 Ohm
MDC IDC MSMT LEADCHNL RA IMPEDANCE VALUE: 376 Ohm
MDC IDC MSMT LEADCHNL RV IMPEDANCE VALUE: 517 Ohm
MDC IDC SET LEADCHNL RV PACING AMPLITUDE: 2.5 V
MDC IDC STAT BRADY AP VP PERCENT: 3 %
MDC IDC STAT BRADY AS VP PERCENT: 0 %
MDC IDC STAT BRADY AS VS PERCENT: 30 %

## 2018-09-23 DIAGNOSIS — Z6835 Body mass index (BMI) 35.0-35.9, adult: Secondary | ICD-10-CM | POA: Diagnosis not present

## 2018-09-23 DIAGNOSIS — R55 Syncope and collapse: Secondary | ICD-10-CM | POA: Diagnosis not present

## 2018-09-23 DIAGNOSIS — I10 Essential (primary) hypertension: Secondary | ICD-10-CM | POA: Diagnosis not present

## 2018-10-12 DIAGNOSIS — I10 Essential (primary) hypertension: Secondary | ICD-10-CM | POA: Diagnosis not present

## 2018-10-12 DIAGNOSIS — I358 Other nonrheumatic aortic valve disorders: Secondary | ICD-10-CM | POA: Diagnosis not present

## 2018-10-12 DIAGNOSIS — I2510 Atherosclerotic heart disease of native coronary artery without angina pectoris: Secondary | ICD-10-CM | POA: Diagnosis not present

## 2018-10-12 DIAGNOSIS — Z7982 Long term (current) use of aspirin: Secondary | ICD-10-CM | POA: Diagnosis not present

## 2018-10-12 DIAGNOSIS — R55 Syncope and collapse: Secondary | ICD-10-CM | POA: Diagnosis not present

## 2018-10-12 DIAGNOSIS — R0602 Shortness of breath: Secondary | ICD-10-CM | POA: Diagnosis not present

## 2018-10-12 DIAGNOSIS — I130 Hypertensive heart and chronic kidney disease with heart failure and stage 1 through stage 4 chronic kidney disease, or unspecified chronic kidney disease: Secondary | ICD-10-CM | POA: Diagnosis not present

## 2018-10-12 DIAGNOSIS — J90 Pleural effusion, not elsewhere classified: Secondary | ICD-10-CM | POA: Diagnosis not present

## 2018-10-12 DIAGNOSIS — E039 Hypothyroidism, unspecified: Secondary | ICD-10-CM | POA: Diagnosis not present

## 2018-10-12 DIAGNOSIS — R918 Other nonspecific abnormal finding of lung field: Secondary | ICD-10-CM | POA: Diagnosis not present

## 2018-10-12 DIAGNOSIS — I495 Sick sinus syndrome: Secondary | ICD-10-CM | POA: Diagnosis not present

## 2018-10-12 DIAGNOSIS — J9811 Atelectasis: Secondary | ICD-10-CM | POA: Diagnosis not present

## 2018-10-12 DIAGNOSIS — I519 Heart disease, unspecified: Secondary | ICD-10-CM | POA: Diagnosis not present

## 2018-10-12 DIAGNOSIS — G4733 Obstructive sleep apnea (adult) (pediatric): Secondary | ICD-10-CM | POA: Diagnosis not present

## 2018-10-12 DIAGNOSIS — E538 Deficiency of other specified B group vitamins: Secondary | ICD-10-CM | POA: Diagnosis not present

## 2018-10-12 DIAGNOSIS — I2699 Other pulmonary embolism without acute cor pulmonale: Secondary | ICD-10-CM | POA: Diagnosis not present

## 2018-10-12 DIAGNOSIS — R0902 Hypoxemia: Secondary | ICD-10-CM | POA: Diagnosis not present

## 2018-10-12 DIAGNOSIS — R0689 Other abnormalities of breathing: Secondary | ICD-10-CM | POA: Diagnosis not present

## 2018-10-12 DIAGNOSIS — E559 Vitamin D deficiency, unspecified: Secondary | ICD-10-CM | POA: Diagnosis not present

## 2018-10-12 DIAGNOSIS — R404 Transient alteration of awareness: Secondary | ICD-10-CM | POA: Diagnosis not present

## 2018-10-12 DIAGNOSIS — E1122 Type 2 diabetes mellitus with diabetic chronic kidney disease: Secondary | ICD-10-CM | POA: Diagnosis not present

## 2018-10-12 DIAGNOSIS — I340 Nonrheumatic mitral (valve) insufficiency: Secondary | ICD-10-CM | POA: Diagnosis not present

## 2018-10-12 DIAGNOSIS — Z79899 Other long term (current) drug therapy: Secondary | ICD-10-CM | POA: Diagnosis not present

## 2018-10-12 DIAGNOSIS — R112 Nausea with vomiting, unspecified: Secondary | ICD-10-CM | POA: Diagnosis not present

## 2018-10-12 DIAGNOSIS — N183 Chronic kidney disease, stage 3 (moderate): Secondary | ICD-10-CM | POA: Diagnosis not present

## 2018-10-12 DIAGNOSIS — I639 Cerebral infarction, unspecified: Secondary | ICD-10-CM | POA: Diagnosis not present

## 2018-10-12 DIAGNOSIS — I517 Cardiomegaly: Secondary | ICD-10-CM | POA: Diagnosis not present

## 2018-10-12 DIAGNOSIS — R4182 Altered mental status, unspecified: Secondary | ICD-10-CM | POA: Diagnosis not present

## 2018-10-12 DIAGNOSIS — I5032 Chronic diastolic (congestive) heart failure: Secondary | ICD-10-CM | POA: Diagnosis not present

## 2018-10-12 DIAGNOSIS — Z7901 Long term (current) use of anticoagulants: Secondary | ICD-10-CM | POA: Diagnosis not present

## 2018-10-12 DIAGNOSIS — N189 Chronic kidney disease, unspecified: Secondary | ICD-10-CM | POA: Diagnosis not present

## 2018-10-12 DIAGNOSIS — M109 Gout, unspecified: Secondary | ICD-10-CM | POA: Diagnosis not present

## 2018-10-12 DIAGNOSIS — I951 Orthostatic hypotension: Secondary | ICD-10-CM | POA: Diagnosis not present

## 2018-10-12 DIAGNOSIS — R109 Unspecified abdominal pain: Secondary | ICD-10-CM | POA: Diagnosis not present

## 2018-10-12 DIAGNOSIS — I6529 Occlusion and stenosis of unspecified carotid artery: Secondary | ICD-10-CM | POA: Diagnosis not present

## 2018-10-12 DIAGNOSIS — R569 Unspecified convulsions: Secondary | ICD-10-CM | POA: Diagnosis not present

## 2018-10-12 DIAGNOSIS — Z950 Presence of cardiac pacemaker: Secondary | ICD-10-CM | POA: Diagnosis not present

## 2018-10-12 DIAGNOSIS — R4020 Unspecified coma: Secondary | ICD-10-CM | POA: Diagnosis not present

## 2018-10-21 DIAGNOSIS — E039 Hypothyroidism, unspecified: Secondary | ICD-10-CM | POA: Diagnosis not present

## 2018-10-21 DIAGNOSIS — E1122 Type 2 diabetes mellitus with diabetic chronic kidney disease: Secondary | ICD-10-CM | POA: Diagnosis not present

## 2018-10-21 DIAGNOSIS — G4733 Obstructive sleep apnea (adult) (pediatric): Secondary | ICD-10-CM | POA: Diagnosis not present

## 2018-10-21 DIAGNOSIS — G2581 Restless legs syndrome: Secondary | ICD-10-CM | POA: Diagnosis not present

## 2018-10-21 DIAGNOSIS — I129 Hypertensive chronic kidney disease with stage 1 through stage 4 chronic kidney disease, or unspecified chronic kidney disease: Secondary | ICD-10-CM | POA: Diagnosis not present

## 2018-10-21 DIAGNOSIS — I2699 Other pulmonary embolism without acute cor pulmonale: Secondary | ICD-10-CM | POA: Diagnosis not present

## 2018-10-21 DIAGNOSIS — G9001 Carotid sinus syncope: Secondary | ICD-10-CM | POA: Diagnosis not present

## 2018-10-21 DIAGNOSIS — N189 Chronic kidney disease, unspecified: Secondary | ICD-10-CM | POA: Diagnosis not present

## 2018-10-22 DIAGNOSIS — I951 Orthostatic hypotension: Secondary | ICD-10-CM | POA: Diagnosis not present

## 2018-10-22 DIAGNOSIS — G4733 Obstructive sleep apnea (adult) (pediatric): Secondary | ICD-10-CM | POA: Diagnosis not present

## 2018-10-22 DIAGNOSIS — E1121 Type 2 diabetes mellitus with diabetic nephropathy: Secondary | ICD-10-CM | POA: Diagnosis not present

## 2018-10-22 DIAGNOSIS — G2581 Restless legs syndrome: Secondary | ICD-10-CM | POA: Diagnosis not present

## 2018-10-22 DIAGNOSIS — Z8673 Personal history of transient ischemic attack (TIA), and cerebral infarction without residual deficits: Secondary | ICD-10-CM | POA: Diagnosis not present

## 2018-10-22 DIAGNOSIS — G9001 Carotid sinus syncope: Secondary | ICD-10-CM | POA: Diagnosis not present

## 2018-10-22 DIAGNOSIS — I2699 Other pulmonary embolism without acute cor pulmonale: Secondary | ICD-10-CM | POA: Diagnosis not present

## 2018-10-22 DIAGNOSIS — N182 Chronic kidney disease, stage 2 (mild): Secondary | ICD-10-CM | POA: Diagnosis not present

## 2018-10-22 DIAGNOSIS — I129 Hypertensive chronic kidney disease with stage 1 through stage 4 chronic kidney disease, or unspecified chronic kidney disease: Secondary | ICD-10-CM | POA: Diagnosis not present

## 2018-10-22 DIAGNOSIS — E039 Hypothyroidism, unspecified: Secondary | ICD-10-CM | POA: Diagnosis not present

## 2018-10-22 DIAGNOSIS — N189 Chronic kidney disease, unspecified: Secondary | ICD-10-CM | POA: Diagnosis not present

## 2018-10-22 DIAGNOSIS — R55 Syncope and collapse: Secondary | ICD-10-CM | POA: Diagnosis not present

## 2018-10-22 DIAGNOSIS — Z6836 Body mass index (BMI) 36.0-36.9, adult: Secondary | ICD-10-CM | POA: Diagnosis not present

## 2018-10-22 DIAGNOSIS — E038 Other specified hypothyroidism: Secondary | ICD-10-CM | POA: Diagnosis not present

## 2018-10-22 DIAGNOSIS — E1122 Type 2 diabetes mellitus with diabetic chronic kidney disease: Secondary | ICD-10-CM | POA: Diagnosis not present

## 2018-10-23 DIAGNOSIS — G2581 Restless legs syndrome: Secondary | ICD-10-CM | POA: Diagnosis not present

## 2018-10-23 DIAGNOSIS — G9001 Carotid sinus syncope: Secondary | ICD-10-CM | POA: Diagnosis not present

## 2018-10-23 DIAGNOSIS — N189 Chronic kidney disease, unspecified: Secondary | ICD-10-CM | POA: Diagnosis not present

## 2018-10-23 DIAGNOSIS — E039 Hypothyroidism, unspecified: Secondary | ICD-10-CM | POA: Diagnosis not present

## 2018-10-23 DIAGNOSIS — E1122 Type 2 diabetes mellitus with diabetic chronic kidney disease: Secondary | ICD-10-CM | POA: Diagnosis not present

## 2018-10-23 DIAGNOSIS — G4733 Obstructive sleep apnea (adult) (pediatric): Secondary | ICD-10-CM | POA: Diagnosis not present

## 2018-10-23 DIAGNOSIS — I129 Hypertensive chronic kidney disease with stage 1 through stage 4 chronic kidney disease, or unspecified chronic kidney disease: Secondary | ICD-10-CM | POA: Diagnosis not present

## 2018-10-23 DIAGNOSIS — I2699 Other pulmonary embolism without acute cor pulmonale: Secondary | ICD-10-CM | POA: Diagnosis not present

## 2018-10-24 DIAGNOSIS — I129 Hypertensive chronic kidney disease with stage 1 through stage 4 chronic kidney disease, or unspecified chronic kidney disease: Secondary | ICD-10-CM | POA: Diagnosis not present

## 2018-10-24 DIAGNOSIS — E039 Hypothyroidism, unspecified: Secondary | ICD-10-CM | POA: Diagnosis not present

## 2018-10-24 DIAGNOSIS — G9001 Carotid sinus syncope: Secondary | ICD-10-CM | POA: Diagnosis not present

## 2018-10-24 DIAGNOSIS — N189 Chronic kidney disease, unspecified: Secondary | ICD-10-CM | POA: Diagnosis not present

## 2018-10-24 DIAGNOSIS — E1122 Type 2 diabetes mellitus with diabetic chronic kidney disease: Secondary | ICD-10-CM | POA: Diagnosis not present

## 2018-10-24 DIAGNOSIS — G4733 Obstructive sleep apnea (adult) (pediatric): Secondary | ICD-10-CM | POA: Diagnosis not present

## 2018-10-24 DIAGNOSIS — G2581 Restless legs syndrome: Secondary | ICD-10-CM | POA: Diagnosis not present

## 2018-10-24 DIAGNOSIS — I2699 Other pulmonary embolism without acute cor pulmonale: Secondary | ICD-10-CM | POA: Diagnosis not present

## 2018-10-28 DIAGNOSIS — I129 Hypertensive chronic kidney disease with stage 1 through stage 4 chronic kidney disease, or unspecified chronic kidney disease: Secondary | ICD-10-CM | POA: Diagnosis not present

## 2018-10-28 DIAGNOSIS — G2581 Restless legs syndrome: Secondary | ICD-10-CM | POA: Diagnosis not present

## 2018-10-28 DIAGNOSIS — I2699 Other pulmonary embolism without acute cor pulmonale: Secondary | ICD-10-CM | POA: Diagnosis not present

## 2018-10-28 DIAGNOSIS — G9001 Carotid sinus syncope: Secondary | ICD-10-CM | POA: Diagnosis not present

## 2018-10-28 DIAGNOSIS — G4733 Obstructive sleep apnea (adult) (pediatric): Secondary | ICD-10-CM | POA: Diagnosis not present

## 2018-10-28 DIAGNOSIS — N189 Chronic kidney disease, unspecified: Secondary | ICD-10-CM | POA: Diagnosis not present

## 2018-10-28 DIAGNOSIS — E1122 Type 2 diabetes mellitus with diabetic chronic kidney disease: Secondary | ICD-10-CM | POA: Diagnosis not present

## 2018-10-28 DIAGNOSIS — E039 Hypothyroidism, unspecified: Secondary | ICD-10-CM | POA: Diagnosis not present

## 2018-10-30 ENCOUNTER — Telehealth: Payer: Self-pay | Admitting: Cardiology

## 2018-10-30 DIAGNOSIS — I2699 Other pulmonary embolism without acute cor pulmonale: Secondary | ICD-10-CM | POA: Diagnosis not present

## 2018-10-30 DIAGNOSIS — G4733 Obstructive sleep apnea (adult) (pediatric): Secondary | ICD-10-CM | POA: Diagnosis not present

## 2018-10-30 DIAGNOSIS — E1122 Type 2 diabetes mellitus with diabetic chronic kidney disease: Secondary | ICD-10-CM | POA: Diagnosis not present

## 2018-10-30 DIAGNOSIS — I129 Hypertensive chronic kidney disease with stage 1 through stage 4 chronic kidney disease, or unspecified chronic kidney disease: Secondary | ICD-10-CM | POA: Diagnosis not present

## 2018-10-30 DIAGNOSIS — N189 Chronic kidney disease, unspecified: Secondary | ICD-10-CM | POA: Diagnosis not present

## 2018-10-30 DIAGNOSIS — E039 Hypothyroidism, unspecified: Secondary | ICD-10-CM | POA: Diagnosis not present

## 2018-10-30 DIAGNOSIS — G9001 Carotid sinus syncope: Secondary | ICD-10-CM | POA: Diagnosis not present

## 2018-10-30 DIAGNOSIS — G2581 Restless legs syndrome: Secondary | ICD-10-CM | POA: Diagnosis not present

## 2018-10-30 NOTE — Telephone Encounter (Signed)
Schedule nurse visit to have formal orthostatic vital signs obtained.  He is already on midodrine which should be helpful for orthostatic hypotension.  If he is able to wear compression stockings this would also be useful.  May need to stop his beta-blocker but would get more information first.

## 2018-10-30 NOTE — Telephone Encounter (Signed)
[name] is currently at patient's house and wanted to let Dr Diona BrownerMcDowell know of patients Bp readings BP this morning sitting   92/60   Bp standing  72/44   Has been dizzy and weak

## 2018-10-30 NOTE — Telephone Encounter (Signed)
Wife Vena Austria(Eleanor) notified.  Stated that he is wearing the stockings now.  Nurse visit scheduled for tomorrow morning at 9:00 am here in [place].  Instructed to bring medication bottles as well.  Cathy with [facility] made aware as well.

## 2018-10-30 NOTE — Telephone Encounter (Signed)
Spoke with wife Vena Austria(Eleanor) in regards to low BP readings.  Stated this is new since last OV 09/10/2018.  Has been to pmd & hospital several times (see care everywhere).  Medication list reconciled with wife today.  No c/o chest pain.  Does notice some SOB going on off/on since then as well.  Please advise.

## 2018-10-31 ENCOUNTER — Ambulatory Visit (INDEPENDENT_AMBULATORY_CARE_PROVIDER_SITE_OTHER): Payer: Medicare HMO | Admitting: *Deleted

## 2018-10-31 DIAGNOSIS — R42 Dizziness and giddiness: Secondary | ICD-10-CM | POA: Diagnosis not present

## 2018-10-31 DIAGNOSIS — I129 Hypertensive chronic kidney disease with stage 1 through stage 4 chronic kidney disease, or unspecified chronic kidney disease: Secondary | ICD-10-CM | POA: Diagnosis not present

## 2018-10-31 DIAGNOSIS — N189 Chronic kidney disease, unspecified: Secondary | ICD-10-CM | POA: Diagnosis not present

## 2018-10-31 DIAGNOSIS — E1122 Type 2 diabetes mellitus with diabetic chronic kidney disease: Secondary | ICD-10-CM | POA: Diagnosis not present

## 2018-10-31 DIAGNOSIS — E039 Hypothyroidism, unspecified: Secondary | ICD-10-CM | POA: Diagnosis not present

## 2018-10-31 DIAGNOSIS — G2581 Restless legs syndrome: Secondary | ICD-10-CM | POA: Diagnosis not present

## 2018-10-31 DIAGNOSIS — G9001 Carotid sinus syncope: Secondary | ICD-10-CM | POA: Diagnosis not present

## 2018-10-31 DIAGNOSIS — I2699 Other pulmonary embolism without acute cor pulmonale: Secondary | ICD-10-CM | POA: Diagnosis not present

## 2018-10-31 DIAGNOSIS — G4733 Obstructive sleep apnea (adult) (pediatric): Secondary | ICD-10-CM | POA: Diagnosis not present

## 2018-10-31 NOTE — Progress Notes (Signed)
Discussed with nursing.  Patient did not have orthostatic change in blood pressure although he did feel dizzy when standing.  He has had recurring falls despite using support devices.  Only other medication to hold that would be affecting blood pressure is Lopressor, we will stop this.  He is also on proamatine.  Our office will be contacting Dr. Bartholomew CrewsHasanaj's office in regards to having this patient referred for a PT evaluation as there may be other balance issues to consider.

## 2018-10-31 NOTE — Progress Notes (Signed)
Patient is in office this morning for orthostatic BP readings.  See epic for readings.  No c/o chest pain or sob this morning.  Does c/o feeling tired & dizziness.  Stated that he did feel dizzy during testing, but started to feel better after he sat down.

## 2018-11-04 DIAGNOSIS — G2581 Restless legs syndrome: Secondary | ICD-10-CM | POA: Diagnosis not present

## 2018-11-04 DIAGNOSIS — G9001 Carotid sinus syncope: Secondary | ICD-10-CM | POA: Diagnosis not present

## 2018-11-04 DIAGNOSIS — I2699 Other pulmonary embolism without acute cor pulmonale: Secondary | ICD-10-CM | POA: Diagnosis not present

## 2018-11-04 DIAGNOSIS — G4733 Obstructive sleep apnea (adult) (pediatric): Secondary | ICD-10-CM | POA: Diagnosis not present

## 2018-11-04 DIAGNOSIS — E1122 Type 2 diabetes mellitus with diabetic chronic kidney disease: Secondary | ICD-10-CM | POA: Diagnosis not present

## 2018-11-04 DIAGNOSIS — E039 Hypothyroidism, unspecified: Secondary | ICD-10-CM | POA: Diagnosis not present

## 2018-11-04 DIAGNOSIS — N189 Chronic kidney disease, unspecified: Secondary | ICD-10-CM | POA: Diagnosis not present

## 2018-11-04 DIAGNOSIS — I129 Hypertensive chronic kidney disease with stage 1 through stage 4 chronic kidney disease, or unspecified chronic kidney disease: Secondary | ICD-10-CM | POA: Diagnosis not present

## 2018-11-04 NOTE — Progress Notes (Signed)
Spoke with Amy, Dr. Olena LeatherwoodHasanaj nurse - informed her of Dr. Ival BibleMcDowell's recommendations.  She stated that he already has follow up scheduled for 11/14/2018.  Will forward this note to their office.

## 2018-11-06 DIAGNOSIS — G2581 Restless legs syndrome: Secondary | ICD-10-CM | POA: Diagnosis not present

## 2018-11-06 DIAGNOSIS — E039 Hypothyroidism, unspecified: Secondary | ICD-10-CM | POA: Diagnosis not present

## 2018-11-06 DIAGNOSIS — N189 Chronic kidney disease, unspecified: Secondary | ICD-10-CM | POA: Diagnosis not present

## 2018-11-06 DIAGNOSIS — I129 Hypertensive chronic kidney disease with stage 1 through stage 4 chronic kidney disease, or unspecified chronic kidney disease: Secondary | ICD-10-CM | POA: Diagnosis not present

## 2018-11-06 DIAGNOSIS — I2699 Other pulmonary embolism without acute cor pulmonale: Secondary | ICD-10-CM | POA: Diagnosis not present

## 2018-11-06 DIAGNOSIS — E1122 Type 2 diabetes mellitus with diabetic chronic kidney disease: Secondary | ICD-10-CM | POA: Diagnosis not present

## 2018-11-06 DIAGNOSIS — G4733 Obstructive sleep apnea (adult) (pediatric): Secondary | ICD-10-CM | POA: Diagnosis not present

## 2018-11-06 DIAGNOSIS — G9001 Carotid sinus syncope: Secondary | ICD-10-CM | POA: Diagnosis not present

## 2018-11-07 ENCOUNTER — Telehealth: Payer: Self-pay | Admitting: Cardiology

## 2018-11-07 DIAGNOSIS — E039 Hypothyroidism, unspecified: Secondary | ICD-10-CM | POA: Diagnosis not present

## 2018-11-07 DIAGNOSIS — I2699 Other pulmonary embolism without acute cor pulmonale: Secondary | ICD-10-CM | POA: Diagnosis not present

## 2018-11-07 DIAGNOSIS — N189 Chronic kidney disease, unspecified: Secondary | ICD-10-CM | POA: Diagnosis not present

## 2018-11-07 DIAGNOSIS — G2581 Restless legs syndrome: Secondary | ICD-10-CM | POA: Diagnosis not present

## 2018-11-07 DIAGNOSIS — I129 Hypertensive chronic kidney disease with stage 1 through stage 4 chronic kidney disease, or unspecified chronic kidney disease: Secondary | ICD-10-CM | POA: Diagnosis not present

## 2018-11-07 DIAGNOSIS — G9001 Carotid sinus syncope: Secondary | ICD-10-CM | POA: Diagnosis not present

## 2018-11-07 DIAGNOSIS — G4733 Obstructive sleep apnea (adult) (pediatric): Secondary | ICD-10-CM | POA: Diagnosis not present

## 2018-11-07 DIAGNOSIS — E1122 Type 2 diabetes mellitus with diabetic chronic kidney disease: Secondary | ICD-10-CM | POA: Diagnosis not present

## 2018-11-07 NOTE — Telephone Encounter (Signed)
Noted.  I am hopeful that Dr. Olena LeatherwoodHasanaj also followed through and referred the patient for PT/OT evaluation to assess for other potential causes of his dizziness.

## 2018-11-07 NOTE — Telephone Encounter (Signed)
[name] with [facility] called to give report of recent BP readings.  148/90 sitting  130/80 standing  No veritgo   [phone] ([name]) can leave a VM

## 2018-11-11 DIAGNOSIS — G2581 Restless legs syndrome: Secondary | ICD-10-CM | POA: Diagnosis not present

## 2018-11-11 DIAGNOSIS — N189 Chronic kidney disease, unspecified: Secondary | ICD-10-CM | POA: Diagnosis not present

## 2018-11-11 DIAGNOSIS — G9001 Carotid sinus syncope: Secondary | ICD-10-CM | POA: Diagnosis not present

## 2018-11-11 DIAGNOSIS — G4733 Obstructive sleep apnea (adult) (pediatric): Secondary | ICD-10-CM | POA: Diagnosis not present

## 2018-11-11 DIAGNOSIS — I129 Hypertensive chronic kidney disease with stage 1 through stage 4 chronic kidney disease, or unspecified chronic kidney disease: Secondary | ICD-10-CM | POA: Diagnosis not present

## 2018-11-11 DIAGNOSIS — E1122 Type 2 diabetes mellitus with diabetic chronic kidney disease: Secondary | ICD-10-CM | POA: Diagnosis not present

## 2018-11-11 DIAGNOSIS — I2699 Other pulmonary embolism without acute cor pulmonale: Secondary | ICD-10-CM | POA: Diagnosis not present

## 2018-11-11 DIAGNOSIS — E039 Hypothyroidism, unspecified: Secondary | ICD-10-CM | POA: Diagnosis not present

## 2018-11-13 DIAGNOSIS — E039 Hypothyroidism, unspecified: Secondary | ICD-10-CM | POA: Diagnosis not present

## 2018-11-13 DIAGNOSIS — G4733 Obstructive sleep apnea (adult) (pediatric): Secondary | ICD-10-CM | POA: Diagnosis not present

## 2018-11-13 DIAGNOSIS — I2699 Other pulmonary embolism without acute cor pulmonale: Secondary | ICD-10-CM | POA: Diagnosis not present

## 2018-11-13 DIAGNOSIS — G2581 Restless legs syndrome: Secondary | ICD-10-CM | POA: Diagnosis not present

## 2018-11-13 DIAGNOSIS — G9001 Carotid sinus syncope: Secondary | ICD-10-CM | POA: Diagnosis not present

## 2018-11-13 DIAGNOSIS — I129 Hypertensive chronic kidney disease with stage 1 through stage 4 chronic kidney disease, or unspecified chronic kidney disease: Secondary | ICD-10-CM | POA: Diagnosis not present

## 2018-11-13 DIAGNOSIS — N189 Chronic kidney disease, unspecified: Secondary | ICD-10-CM | POA: Diagnosis not present

## 2018-11-13 DIAGNOSIS — E1122 Type 2 diabetes mellitus with diabetic chronic kidney disease: Secondary | ICD-10-CM | POA: Diagnosis not present

## 2018-11-14 DIAGNOSIS — Z6835 Body mass index (BMI) 35.0-35.9, adult: Secondary | ICD-10-CM | POA: Diagnosis not present

## 2018-11-14 DIAGNOSIS — Z8673 Personal history of transient ischemic attack (TIA), and cerebral infarction without residual deficits: Secondary | ICD-10-CM | POA: Diagnosis not present

## 2018-11-14 DIAGNOSIS — E038 Other specified hypothyroidism: Secondary | ICD-10-CM | POA: Diagnosis not present

## 2018-11-14 DIAGNOSIS — E1121 Type 2 diabetes mellitus with diabetic nephropathy: Secondary | ICD-10-CM | POA: Diagnosis not present

## 2018-11-14 DIAGNOSIS — F419 Anxiety disorder, unspecified: Secondary | ICD-10-CM | POA: Diagnosis not present

## 2018-11-14 DIAGNOSIS — I10 Essential (primary) hypertension: Secondary | ICD-10-CM | POA: Diagnosis not present

## 2018-11-14 DIAGNOSIS — N182 Chronic kidney disease, stage 2 (mild): Secondary | ICD-10-CM | POA: Diagnosis not present

## 2018-11-24 ENCOUNTER — Ambulatory Visit: Payer: Medicare HMO

## 2018-11-26 LAB — CUP PACEART REMOTE DEVICE CHECK
Battery Remaining Longevity: 127 mo
Battery Voltage: 2.8 V
Brady Statistic AP VP Percent: 4 %
Brady Statistic AS VP Percent: 0 %
Date Time Interrogation Session: 20200107213057
Implantable Lead Implant Date: 20060828
Implantable Lead Location: 753859
Implantable Lead Model: 5076
Implantable Lead Model: 5076
Implantable Pulse Generator Implant Date: 20160906
Lead Channel Impedance Value: 371 Ohm
Lead Channel Impedance Value: 527 Ohm
Lead Channel Pacing Threshold Amplitude: 0.875 V
Lead Channel Pacing Threshold Amplitude: 2.25 V
Lead Channel Pacing Threshold Pulse Width: 0.4 ms
Lead Channel Pacing Threshold Pulse Width: 0.4 ms
Lead Channel Setting Pacing Amplitude: 2 V
Lead Channel Setting Sensing Sensitivity: 2.8 mV
MDC IDC LEAD IMPLANT DT: 20060828
MDC IDC LEAD LOCATION: 753860
MDC IDC MSMT BATTERY IMPEDANCE: 183 Ohm
MDC IDC SET LEADCHNL RV PACING AMPLITUDE: 2.5 V
MDC IDC SET LEADCHNL RV PACING PULSEWIDTH: 0.64 ms
MDC IDC STAT BRADY AP VS PERCENT: 39 %
MDC IDC STAT BRADY AS VS PERCENT: 57 %

## 2018-11-29 DIAGNOSIS — M25561 Pain in right knee: Secondary | ICD-10-CM | POA: Diagnosis not present

## 2018-11-29 DIAGNOSIS — S8991XA Unspecified injury of right lower leg, initial encounter: Secondary | ICD-10-CM | POA: Diagnosis not present

## 2018-11-29 DIAGNOSIS — D689 Coagulation defect, unspecified: Secondary | ICD-10-CM | POA: Diagnosis not present

## 2018-11-29 DIAGNOSIS — S8001XA Contusion of right knee, initial encounter: Secondary | ICD-10-CM | POA: Diagnosis not present

## 2018-11-29 DIAGNOSIS — R55 Syncope and collapse: Secondary | ICD-10-CM | POA: Diagnosis not present

## 2018-11-29 DIAGNOSIS — R102 Pelvic and perineal pain: Secondary | ICD-10-CM | POA: Diagnosis not present

## 2018-11-29 DIAGNOSIS — I5032 Chronic diastolic (congestive) heart failure: Secondary | ICD-10-CM | POA: Diagnosis not present

## 2018-11-29 DIAGNOSIS — M7989 Other specified soft tissue disorders: Secondary | ICD-10-CM | POA: Diagnosis not present

## 2018-11-29 DIAGNOSIS — W07XXXA Fall from chair, initial encounter: Secondary | ICD-10-CM | POA: Diagnosis not present

## 2018-11-29 DIAGNOSIS — E1122 Type 2 diabetes mellitus with diabetic chronic kidney disease: Secondary | ICD-10-CM | POA: Diagnosis not present

## 2018-11-29 DIAGNOSIS — S199XXA Unspecified injury of neck, initial encounter: Secondary | ICD-10-CM | POA: Diagnosis not present

## 2018-11-29 DIAGNOSIS — S299XXA Unspecified injury of thorax, initial encounter: Secondary | ICD-10-CM | POA: Diagnosis not present

## 2018-11-29 DIAGNOSIS — M79602 Pain in left arm: Secondary | ICD-10-CM | POA: Diagnosis not present

## 2018-11-29 DIAGNOSIS — S5000XA Contusion of unspecified elbow, initial encounter: Secondary | ICD-10-CM | POA: Diagnosis not present

## 2018-11-29 DIAGNOSIS — E785 Hyperlipidemia, unspecified: Secondary | ICD-10-CM | POA: Diagnosis not present

## 2018-11-29 DIAGNOSIS — I130 Hypertensive heart and chronic kidney disease with heart failure and stage 1 through stage 4 chronic kidney disease, or unspecified chronic kidney disease: Secondary | ICD-10-CM | POA: Diagnosis not present

## 2018-11-29 DIAGNOSIS — M25522 Pain in left elbow: Secondary | ICD-10-CM | POA: Diagnosis not present

## 2018-11-29 DIAGNOSIS — R51 Headache: Secondary | ICD-10-CM | POA: Diagnosis not present

## 2018-11-29 DIAGNOSIS — N183 Chronic kidney disease, stage 3 (moderate): Secondary | ICD-10-CM | POA: Diagnosis not present

## 2018-11-29 DIAGNOSIS — S5002XA Contusion of left elbow, initial encounter: Secondary | ICD-10-CM | POA: Diagnosis not present

## 2018-11-29 DIAGNOSIS — S59902A Unspecified injury of left elbow, initial encounter: Secondary | ICD-10-CM | POA: Diagnosis not present

## 2018-11-29 DIAGNOSIS — S4992XA Unspecified injury of left shoulder and upper arm, initial encounter: Secondary | ICD-10-CM | POA: Diagnosis not present

## 2018-12-04 DIAGNOSIS — D689 Coagulation defect, unspecified: Secondary | ICD-10-CM | POA: Diagnosis not present

## 2018-12-04 DIAGNOSIS — I5032 Chronic diastolic (congestive) heart failure: Secondary | ICD-10-CM | POA: Diagnosis not present

## 2018-12-04 DIAGNOSIS — N183 Chronic kidney disease, stage 3 (moderate): Secondary | ICD-10-CM | POA: Diagnosis not present

## 2018-12-04 DIAGNOSIS — S5002XD Contusion of left elbow, subsequent encounter: Secondary | ICD-10-CM | POA: Diagnosis not present

## 2018-12-04 DIAGNOSIS — E1122 Type 2 diabetes mellitus with diabetic chronic kidney disease: Secondary | ICD-10-CM | POA: Diagnosis not present

## 2018-12-04 DIAGNOSIS — I130 Hypertensive heart and chronic kidney disease with heart failure and stage 1 through stage 4 chronic kidney disease, or unspecified chronic kidney disease: Secondary | ICD-10-CM | POA: Diagnosis not present

## 2018-12-04 DIAGNOSIS — G9001 Carotid sinus syncope: Secondary | ICD-10-CM | POA: Diagnosis not present

## 2018-12-04 DIAGNOSIS — I2510 Atherosclerotic heart disease of native coronary artery without angina pectoris: Secondary | ICD-10-CM | POA: Diagnosis not present

## 2018-12-04 DIAGNOSIS — M545 Low back pain: Secondary | ICD-10-CM | POA: Diagnosis not present

## 2018-12-05 DIAGNOSIS — I5032 Chronic diastolic (congestive) heart failure: Secondary | ICD-10-CM | POA: Diagnosis not present

## 2018-12-05 DIAGNOSIS — D689 Coagulation defect, unspecified: Secondary | ICD-10-CM | POA: Diagnosis not present

## 2018-12-05 DIAGNOSIS — G9001 Carotid sinus syncope: Secondary | ICD-10-CM | POA: Diagnosis not present

## 2018-12-05 DIAGNOSIS — I130 Hypertensive heart and chronic kidney disease with heart failure and stage 1 through stage 4 chronic kidney disease, or unspecified chronic kidney disease: Secondary | ICD-10-CM | POA: Diagnosis not present

## 2018-12-05 DIAGNOSIS — N183 Chronic kidney disease, stage 3 (moderate): Secondary | ICD-10-CM | POA: Diagnosis not present

## 2018-12-05 DIAGNOSIS — I2510 Atherosclerotic heart disease of native coronary artery without angina pectoris: Secondary | ICD-10-CM | POA: Diagnosis not present

## 2018-12-05 DIAGNOSIS — E1122 Type 2 diabetes mellitus with diabetic chronic kidney disease: Secondary | ICD-10-CM | POA: Diagnosis not present

## 2018-12-05 DIAGNOSIS — S5002XD Contusion of left elbow, subsequent encounter: Secondary | ICD-10-CM | POA: Diagnosis not present

## 2018-12-05 DIAGNOSIS — M545 Low back pain: Secondary | ICD-10-CM | POA: Diagnosis not present

## 2018-12-09 DIAGNOSIS — Z6835 Body mass index (BMI) 35.0-35.9, adult: Secondary | ICD-10-CM | POA: Diagnosis not present

## 2018-12-09 DIAGNOSIS — R42 Dizziness and giddiness: Secondary | ICD-10-CM | POA: Diagnosis not present

## 2018-12-09 DIAGNOSIS — R55 Syncope and collapse: Secondary | ICD-10-CM | POA: Diagnosis not present

## 2018-12-10 DIAGNOSIS — N183 Chronic kidney disease, stage 3 (moderate): Secondary | ICD-10-CM | POA: Diagnosis not present

## 2018-12-10 DIAGNOSIS — E1122 Type 2 diabetes mellitus with diabetic chronic kidney disease: Secondary | ICD-10-CM | POA: Diagnosis not present

## 2018-12-10 DIAGNOSIS — M545 Low back pain: Secondary | ICD-10-CM | POA: Diagnosis not present

## 2018-12-10 DIAGNOSIS — I5032 Chronic diastolic (congestive) heart failure: Secondary | ICD-10-CM | POA: Diagnosis not present

## 2018-12-10 DIAGNOSIS — I2510 Atherosclerotic heart disease of native coronary artery without angina pectoris: Secondary | ICD-10-CM | POA: Diagnosis not present

## 2018-12-10 DIAGNOSIS — I130 Hypertensive heart and chronic kidney disease with heart failure and stage 1 through stage 4 chronic kidney disease, or unspecified chronic kidney disease: Secondary | ICD-10-CM | POA: Diagnosis not present

## 2018-12-10 DIAGNOSIS — D689 Coagulation defect, unspecified: Secondary | ICD-10-CM | POA: Diagnosis not present

## 2018-12-10 DIAGNOSIS — G9001 Carotid sinus syncope: Secondary | ICD-10-CM | POA: Diagnosis not present

## 2018-12-10 DIAGNOSIS — S5002XD Contusion of left elbow, subsequent encounter: Secondary | ICD-10-CM | POA: Diagnosis not present

## 2018-12-12 DIAGNOSIS — I5032 Chronic diastolic (congestive) heart failure: Secondary | ICD-10-CM | POA: Diagnosis not present

## 2018-12-12 DIAGNOSIS — E1122 Type 2 diabetes mellitus with diabetic chronic kidney disease: Secondary | ICD-10-CM | POA: Diagnosis not present

## 2018-12-12 DIAGNOSIS — M545 Low back pain: Secondary | ICD-10-CM | POA: Diagnosis not present

## 2018-12-12 DIAGNOSIS — G9001 Carotid sinus syncope: Secondary | ICD-10-CM | POA: Diagnosis not present

## 2018-12-12 DIAGNOSIS — N183 Chronic kidney disease, stage 3 (moderate): Secondary | ICD-10-CM | POA: Diagnosis not present

## 2018-12-12 DIAGNOSIS — D689 Coagulation defect, unspecified: Secondary | ICD-10-CM | POA: Diagnosis not present

## 2018-12-12 DIAGNOSIS — I130 Hypertensive heart and chronic kidney disease with heart failure and stage 1 through stage 4 chronic kidney disease, or unspecified chronic kidney disease: Secondary | ICD-10-CM | POA: Diagnosis not present

## 2018-12-12 DIAGNOSIS — S5002XD Contusion of left elbow, subsequent encounter: Secondary | ICD-10-CM | POA: Diagnosis not present

## 2018-12-12 DIAGNOSIS — I2510 Atherosclerotic heart disease of native coronary artery without angina pectoris: Secondary | ICD-10-CM | POA: Diagnosis not present

## 2018-12-15 DIAGNOSIS — E1122 Type 2 diabetes mellitus with diabetic chronic kidney disease: Secondary | ICD-10-CM | POA: Diagnosis not present

## 2018-12-15 DIAGNOSIS — S5002XD Contusion of left elbow, subsequent encounter: Secondary | ICD-10-CM | POA: Diagnosis not present

## 2018-12-15 DIAGNOSIS — D689 Coagulation defect, unspecified: Secondary | ICD-10-CM | POA: Diagnosis not present

## 2018-12-15 DIAGNOSIS — I130 Hypertensive heart and chronic kidney disease with heart failure and stage 1 through stage 4 chronic kidney disease, or unspecified chronic kidney disease: Secondary | ICD-10-CM | POA: Diagnosis not present

## 2018-12-15 DIAGNOSIS — G9001 Carotid sinus syncope: Secondary | ICD-10-CM | POA: Diagnosis not present

## 2018-12-15 DIAGNOSIS — N183 Chronic kidney disease, stage 3 (moderate): Secondary | ICD-10-CM | POA: Diagnosis not present

## 2018-12-15 DIAGNOSIS — I5032 Chronic diastolic (congestive) heart failure: Secondary | ICD-10-CM | POA: Diagnosis not present

## 2018-12-15 DIAGNOSIS — M545 Low back pain: Secondary | ICD-10-CM | POA: Diagnosis not present

## 2018-12-15 DIAGNOSIS — I2510 Atherosclerotic heart disease of native coronary artery without angina pectoris: Secondary | ICD-10-CM | POA: Diagnosis not present

## 2018-12-18 DIAGNOSIS — D689 Coagulation defect, unspecified: Secondary | ICD-10-CM | POA: Diagnosis not present

## 2018-12-18 DIAGNOSIS — M545 Low back pain: Secondary | ICD-10-CM | POA: Diagnosis not present

## 2018-12-18 DIAGNOSIS — I5032 Chronic diastolic (congestive) heart failure: Secondary | ICD-10-CM | POA: Diagnosis not present

## 2018-12-18 DIAGNOSIS — N183 Chronic kidney disease, stage 3 (moderate): Secondary | ICD-10-CM | POA: Diagnosis not present

## 2018-12-18 DIAGNOSIS — S5002XD Contusion of left elbow, subsequent encounter: Secondary | ICD-10-CM | POA: Diagnosis not present

## 2018-12-18 DIAGNOSIS — E1122 Type 2 diabetes mellitus with diabetic chronic kidney disease: Secondary | ICD-10-CM | POA: Diagnosis not present

## 2018-12-18 DIAGNOSIS — G9001 Carotid sinus syncope: Secondary | ICD-10-CM | POA: Diagnosis not present

## 2018-12-18 DIAGNOSIS — I2510 Atherosclerotic heart disease of native coronary artery without angina pectoris: Secondary | ICD-10-CM | POA: Diagnosis not present

## 2018-12-18 DIAGNOSIS — I130 Hypertensive heart and chronic kidney disease with heart failure and stage 1 through stage 4 chronic kidney disease, or unspecified chronic kidney disease: Secondary | ICD-10-CM | POA: Diagnosis not present

## 2018-12-22 DIAGNOSIS — S5002XD Contusion of left elbow, subsequent encounter: Secondary | ICD-10-CM | POA: Diagnosis not present

## 2018-12-22 DIAGNOSIS — E1122 Type 2 diabetes mellitus with diabetic chronic kidney disease: Secondary | ICD-10-CM | POA: Diagnosis not present

## 2018-12-22 DIAGNOSIS — I2510 Atherosclerotic heart disease of native coronary artery without angina pectoris: Secondary | ICD-10-CM | POA: Diagnosis not present

## 2018-12-22 DIAGNOSIS — D689 Coagulation defect, unspecified: Secondary | ICD-10-CM | POA: Diagnosis not present

## 2018-12-22 DIAGNOSIS — M545 Low back pain: Secondary | ICD-10-CM | POA: Diagnosis not present

## 2018-12-22 DIAGNOSIS — G9001 Carotid sinus syncope: Secondary | ICD-10-CM | POA: Diagnosis not present

## 2018-12-22 DIAGNOSIS — I5032 Chronic diastolic (congestive) heart failure: Secondary | ICD-10-CM | POA: Diagnosis not present

## 2018-12-22 DIAGNOSIS — N183 Chronic kidney disease, stage 3 (moderate): Secondary | ICD-10-CM | POA: Diagnosis not present

## 2018-12-22 DIAGNOSIS — I130 Hypertensive heart and chronic kidney disease with heart failure and stage 1 through stage 4 chronic kidney disease, or unspecified chronic kidney disease: Secondary | ICD-10-CM | POA: Diagnosis not present

## 2018-12-23 DIAGNOSIS — Z96651 Presence of right artificial knee joint: Secondary | ICD-10-CM | POA: Diagnosis not present

## 2018-12-23 DIAGNOSIS — M7022 Olecranon bursitis, left elbow: Secondary | ICD-10-CM | POA: Diagnosis not present

## 2018-12-23 DIAGNOSIS — R55 Syncope and collapse: Secondary | ICD-10-CM | POA: Diagnosis not present

## 2018-12-23 DIAGNOSIS — S8011XA Contusion of right lower leg, initial encounter: Secondary | ICD-10-CM | POA: Diagnosis not present

## 2018-12-24 DIAGNOSIS — D689 Coagulation defect, unspecified: Secondary | ICD-10-CM | POA: Diagnosis not present

## 2018-12-24 DIAGNOSIS — I5032 Chronic diastolic (congestive) heart failure: Secondary | ICD-10-CM | POA: Diagnosis not present

## 2018-12-24 DIAGNOSIS — I130 Hypertensive heart and chronic kidney disease with heart failure and stage 1 through stage 4 chronic kidney disease, or unspecified chronic kidney disease: Secondary | ICD-10-CM | POA: Diagnosis not present

## 2018-12-24 DIAGNOSIS — E1122 Type 2 diabetes mellitus with diabetic chronic kidney disease: Secondary | ICD-10-CM | POA: Diagnosis not present

## 2018-12-24 DIAGNOSIS — M545 Low back pain: Secondary | ICD-10-CM | POA: Diagnosis not present

## 2018-12-24 DIAGNOSIS — N183 Chronic kidney disease, stage 3 (moderate): Secondary | ICD-10-CM | POA: Diagnosis not present

## 2018-12-24 DIAGNOSIS — I2510 Atherosclerotic heart disease of native coronary artery without angina pectoris: Secondary | ICD-10-CM | POA: Diagnosis not present

## 2018-12-24 DIAGNOSIS — G9001 Carotid sinus syncope: Secondary | ICD-10-CM | POA: Diagnosis not present

## 2018-12-24 DIAGNOSIS — S5002XD Contusion of left elbow, subsequent encounter: Secondary | ICD-10-CM | POA: Diagnosis not present

## 2018-12-30 DIAGNOSIS — E1122 Type 2 diabetes mellitus with diabetic chronic kidney disease: Secondary | ICD-10-CM | POA: Diagnosis not present

## 2018-12-30 DIAGNOSIS — S5002XD Contusion of left elbow, subsequent encounter: Secondary | ICD-10-CM | POA: Diagnosis not present

## 2018-12-30 DIAGNOSIS — G9001 Carotid sinus syncope: Secondary | ICD-10-CM | POA: Diagnosis not present

## 2018-12-30 DIAGNOSIS — I130 Hypertensive heart and chronic kidney disease with heart failure and stage 1 through stage 4 chronic kidney disease, or unspecified chronic kidney disease: Secondary | ICD-10-CM | POA: Diagnosis not present

## 2018-12-30 DIAGNOSIS — D689 Coagulation defect, unspecified: Secondary | ICD-10-CM | POA: Diagnosis not present

## 2018-12-30 DIAGNOSIS — I5032 Chronic diastolic (congestive) heart failure: Secondary | ICD-10-CM | POA: Diagnosis not present

## 2018-12-30 DIAGNOSIS — I2510 Atherosclerotic heart disease of native coronary artery without angina pectoris: Secondary | ICD-10-CM | POA: Diagnosis not present

## 2018-12-30 DIAGNOSIS — N183 Chronic kidney disease, stage 3 (moderate): Secondary | ICD-10-CM | POA: Diagnosis not present

## 2018-12-30 DIAGNOSIS — M545 Low back pain: Secondary | ICD-10-CM | POA: Diagnosis not present

## 2019-01-01 DIAGNOSIS — I5032 Chronic diastolic (congestive) heart failure: Secondary | ICD-10-CM | POA: Diagnosis not present

## 2019-01-01 DIAGNOSIS — G9001 Carotid sinus syncope: Secondary | ICD-10-CM | POA: Diagnosis not present

## 2019-01-01 DIAGNOSIS — D689 Coagulation defect, unspecified: Secondary | ICD-10-CM | POA: Diagnosis not present

## 2019-01-01 DIAGNOSIS — I130 Hypertensive heart and chronic kidney disease with heart failure and stage 1 through stage 4 chronic kidney disease, or unspecified chronic kidney disease: Secondary | ICD-10-CM | POA: Diagnosis not present

## 2019-01-01 DIAGNOSIS — I2510 Atherosclerotic heart disease of native coronary artery without angina pectoris: Secondary | ICD-10-CM | POA: Diagnosis not present

## 2019-01-01 DIAGNOSIS — S5002XD Contusion of left elbow, subsequent encounter: Secondary | ICD-10-CM | POA: Diagnosis not present

## 2019-01-01 DIAGNOSIS — M545 Low back pain: Secondary | ICD-10-CM | POA: Diagnosis not present

## 2019-01-01 DIAGNOSIS — E1122 Type 2 diabetes mellitus with diabetic chronic kidney disease: Secondary | ICD-10-CM | POA: Diagnosis not present

## 2019-01-01 DIAGNOSIS — N183 Chronic kidney disease, stage 3 (moderate): Secondary | ICD-10-CM | POA: Diagnosis not present

## 2019-01-06 DIAGNOSIS — I130 Hypertensive heart and chronic kidney disease with heart failure and stage 1 through stage 4 chronic kidney disease, or unspecified chronic kidney disease: Secondary | ICD-10-CM | POA: Diagnosis not present

## 2019-01-06 DIAGNOSIS — G9001 Carotid sinus syncope: Secondary | ICD-10-CM | POA: Diagnosis not present

## 2019-01-06 DIAGNOSIS — I2510 Atherosclerotic heart disease of native coronary artery without angina pectoris: Secondary | ICD-10-CM | POA: Diagnosis not present

## 2019-01-06 DIAGNOSIS — D689 Coagulation defect, unspecified: Secondary | ICD-10-CM | POA: Diagnosis not present

## 2019-01-06 DIAGNOSIS — E1122 Type 2 diabetes mellitus with diabetic chronic kidney disease: Secondary | ICD-10-CM | POA: Diagnosis not present

## 2019-01-06 DIAGNOSIS — M545 Low back pain: Secondary | ICD-10-CM | POA: Diagnosis not present

## 2019-01-06 DIAGNOSIS — S5002XD Contusion of left elbow, subsequent encounter: Secondary | ICD-10-CM | POA: Diagnosis not present

## 2019-01-06 DIAGNOSIS — N183 Chronic kidney disease, stage 3 (moderate): Secondary | ICD-10-CM | POA: Diagnosis not present

## 2019-01-06 DIAGNOSIS — I5032 Chronic diastolic (congestive) heart failure: Secondary | ICD-10-CM | POA: Diagnosis not present

## 2019-01-08 DIAGNOSIS — M545 Low back pain: Secondary | ICD-10-CM | POA: Diagnosis not present

## 2019-01-08 DIAGNOSIS — D689 Coagulation defect, unspecified: Secondary | ICD-10-CM | POA: Diagnosis not present

## 2019-01-08 DIAGNOSIS — S5002XD Contusion of left elbow, subsequent encounter: Secondary | ICD-10-CM | POA: Diagnosis not present

## 2019-01-08 DIAGNOSIS — I5032 Chronic diastolic (congestive) heart failure: Secondary | ICD-10-CM | POA: Diagnosis not present

## 2019-01-08 DIAGNOSIS — I2510 Atherosclerotic heart disease of native coronary artery without angina pectoris: Secondary | ICD-10-CM | POA: Diagnosis not present

## 2019-01-08 DIAGNOSIS — N183 Chronic kidney disease, stage 3 (moderate): Secondary | ICD-10-CM | POA: Diagnosis not present

## 2019-01-08 DIAGNOSIS — E1122 Type 2 diabetes mellitus with diabetic chronic kidney disease: Secondary | ICD-10-CM | POA: Diagnosis not present

## 2019-01-08 DIAGNOSIS — I130 Hypertensive heart and chronic kidney disease with heart failure and stage 1 through stage 4 chronic kidney disease, or unspecified chronic kidney disease: Secondary | ICD-10-CM | POA: Diagnosis not present

## 2019-01-08 DIAGNOSIS — G9001 Carotid sinus syncope: Secondary | ICD-10-CM | POA: Diagnosis not present

## 2019-01-27 DIAGNOSIS — Z6835 Body mass index (BMI) 35.0-35.9, adult: Secondary | ICD-10-CM | POA: Diagnosis not present

## 2019-01-27 DIAGNOSIS — B0231 Zoster conjunctivitis: Secondary | ICD-10-CM | POA: Diagnosis not present

## 2019-02-17 DIAGNOSIS — Z0000 Encounter for general adult medical examination without abnormal findings: Secondary | ICD-10-CM | POA: Diagnosis not present

## 2019-02-17 DIAGNOSIS — Z6835 Body mass index (BMI) 35.0-35.9, adult: Secondary | ICD-10-CM | POA: Diagnosis not present

## 2019-02-17 DIAGNOSIS — Z7901 Long term (current) use of anticoagulants: Secondary | ICD-10-CM | POA: Diagnosis not present

## 2019-02-17 DIAGNOSIS — E1165 Type 2 diabetes mellitus with hyperglycemia: Secondary | ICD-10-CM | POA: Diagnosis not present

## 2019-02-23 ENCOUNTER — Other Ambulatory Visit: Payer: Self-pay

## 2019-02-23 ENCOUNTER — Ambulatory Visit (INDEPENDENT_AMBULATORY_CARE_PROVIDER_SITE_OTHER): Payer: Medicare HMO | Admitting: *Deleted

## 2019-02-23 DIAGNOSIS — I495 Sick sinus syndrome: Secondary | ICD-10-CM | POA: Diagnosis not present

## 2019-02-23 DIAGNOSIS — I471 Supraventricular tachycardia: Secondary | ICD-10-CM

## 2019-02-23 LAB — CUP PACEART REMOTE DEVICE CHECK
Battery Impedance: 183 Ohm
Battery Remaining Longevity: 132 mo
Battery Voltage: 2.79 V
Brady Statistic AP VP Percent: 3 %
Brady Statistic AP VS Percent: 18 %
Brady Statistic AS VP Percent: 0 %
Brady Statistic AS VS Percent: 80 %
Date Time Interrogation Session: 20200406132233
Implantable Lead Implant Date: 20060828
Implantable Lead Implant Date: 20060828
Implantable Lead Location: 753859
Implantable Lead Location: 753860
Implantable Lead Model: 5076
Implantable Lead Model: 5076
Implantable Pulse Generator Implant Date: 20160906
Lead Channel Impedance Value: 371 Ohm
Lead Channel Impedance Value: 527 Ohm
Lead Channel Pacing Threshold Amplitude: 0.875 V
Lead Channel Pacing Threshold Amplitude: 1.625 V
Lead Channel Pacing Threshold Pulse Width: 0.4 ms
Lead Channel Pacing Threshold Pulse Width: 0.4 ms
Lead Channel Setting Pacing Amplitude: 2 V
Lead Channel Setting Pacing Amplitude: 2.5 V
Lead Channel Setting Pacing Pulse Width: 0.64 ms
Lead Channel Setting Sensing Sensitivity: 2.8 mV

## 2019-03-03 NOTE — Progress Notes (Signed)
Remote pacemaker transmission.   

## 2019-03-11 ENCOUNTER — Ambulatory Visit: Payer: Medicare HMO | Admitting: Cardiology

## 2019-05-07 DIAGNOSIS — Z950 Presence of cardiac pacemaker: Secondary | ICD-10-CM | POA: Diagnosis not present

## 2019-05-07 DIAGNOSIS — B029 Zoster without complications: Secondary | ICD-10-CM | POA: Diagnosis not present

## 2019-05-07 DIAGNOSIS — Z9181 History of falling: Secondary | ICD-10-CM | POA: Diagnosis not present

## 2019-05-07 DIAGNOSIS — I5032 Chronic diastolic (congestive) heart failure: Secondary | ICD-10-CM | POA: Diagnosis not present

## 2019-05-07 DIAGNOSIS — E119 Type 2 diabetes mellitus without complications: Secondary | ICD-10-CM | POA: Diagnosis not present

## 2019-05-07 DIAGNOSIS — Z7984 Long term (current) use of oral hypoglycemic drugs: Secondary | ICD-10-CM | POA: Diagnosis not present

## 2019-05-07 DIAGNOSIS — G40909 Epilepsy, unspecified, not intractable, without status epilepticus: Secondary | ICD-10-CM | POA: Diagnosis not present

## 2019-05-07 DIAGNOSIS — Z7901 Long term (current) use of anticoagulants: Secondary | ICD-10-CM | POA: Diagnosis not present

## 2019-05-07 DIAGNOSIS — M48062 Spinal stenosis, lumbar region with neurogenic claudication: Secondary | ICD-10-CM | POA: Diagnosis not present

## 2019-05-12 DIAGNOSIS — M48062 Spinal stenosis, lumbar region with neurogenic claudication: Secondary | ICD-10-CM | POA: Diagnosis not present

## 2019-05-12 DIAGNOSIS — Z7984 Long term (current) use of oral hypoglycemic drugs: Secondary | ICD-10-CM | POA: Diagnosis not present

## 2019-05-12 DIAGNOSIS — I5032 Chronic diastolic (congestive) heart failure: Secondary | ICD-10-CM | POA: Diagnosis not present

## 2019-05-12 DIAGNOSIS — B029 Zoster without complications: Secondary | ICD-10-CM | POA: Diagnosis not present

## 2019-05-12 DIAGNOSIS — G40909 Epilepsy, unspecified, not intractable, without status epilepticus: Secondary | ICD-10-CM | POA: Diagnosis not present

## 2019-05-12 DIAGNOSIS — Z9181 History of falling: Secondary | ICD-10-CM | POA: Diagnosis not present

## 2019-05-12 DIAGNOSIS — E119 Type 2 diabetes mellitus without complications: Secondary | ICD-10-CM | POA: Diagnosis not present

## 2019-05-12 DIAGNOSIS — Z7901 Long term (current) use of anticoagulants: Secondary | ICD-10-CM | POA: Diagnosis not present

## 2019-05-12 DIAGNOSIS — Z950 Presence of cardiac pacemaker: Secondary | ICD-10-CM | POA: Diagnosis not present

## 2019-05-14 DIAGNOSIS — G40909 Epilepsy, unspecified, not intractable, without status epilepticus: Secondary | ICD-10-CM | POA: Diagnosis not present

## 2019-05-14 DIAGNOSIS — Z7984 Long term (current) use of oral hypoglycemic drugs: Secondary | ICD-10-CM | POA: Diagnosis not present

## 2019-05-14 DIAGNOSIS — Z9181 History of falling: Secondary | ICD-10-CM | POA: Diagnosis not present

## 2019-05-14 DIAGNOSIS — E119 Type 2 diabetes mellitus without complications: Secondary | ICD-10-CM | POA: Diagnosis not present

## 2019-05-14 DIAGNOSIS — Z950 Presence of cardiac pacemaker: Secondary | ICD-10-CM | POA: Diagnosis not present

## 2019-05-14 DIAGNOSIS — B029 Zoster without complications: Secondary | ICD-10-CM | POA: Diagnosis not present

## 2019-05-14 DIAGNOSIS — M48062 Spinal stenosis, lumbar region with neurogenic claudication: Secondary | ICD-10-CM | POA: Diagnosis not present

## 2019-05-14 DIAGNOSIS — Z7901 Long term (current) use of anticoagulants: Secondary | ICD-10-CM | POA: Diagnosis not present

## 2019-05-14 DIAGNOSIS — I5032 Chronic diastolic (congestive) heart failure: Secondary | ICD-10-CM | POA: Diagnosis not present

## 2019-05-18 DIAGNOSIS — Z7901 Long term (current) use of anticoagulants: Secondary | ICD-10-CM | POA: Diagnosis not present

## 2019-05-18 DIAGNOSIS — G40909 Epilepsy, unspecified, not intractable, without status epilepticus: Secondary | ICD-10-CM | POA: Diagnosis not present

## 2019-05-18 DIAGNOSIS — Z7984 Long term (current) use of oral hypoglycemic drugs: Secondary | ICD-10-CM | POA: Diagnosis not present

## 2019-05-18 DIAGNOSIS — Z9181 History of falling: Secondary | ICD-10-CM | POA: Diagnosis not present

## 2019-05-18 DIAGNOSIS — Z950 Presence of cardiac pacemaker: Secondary | ICD-10-CM | POA: Diagnosis not present

## 2019-05-18 DIAGNOSIS — B029 Zoster without complications: Secondary | ICD-10-CM | POA: Diagnosis not present

## 2019-05-18 DIAGNOSIS — M48062 Spinal stenosis, lumbar region with neurogenic claudication: Secondary | ICD-10-CM | POA: Diagnosis not present

## 2019-05-18 DIAGNOSIS — I5032 Chronic diastolic (congestive) heart failure: Secondary | ICD-10-CM | POA: Diagnosis not present

## 2019-05-18 DIAGNOSIS — E119 Type 2 diabetes mellitus without complications: Secondary | ICD-10-CM | POA: Diagnosis not present

## 2019-05-20 DIAGNOSIS — Z6835 Body mass index (BMI) 35.0-35.9, adult: Secondary | ICD-10-CM | POA: Diagnosis not present

## 2019-05-20 DIAGNOSIS — M48062 Spinal stenosis, lumbar region with neurogenic claudication: Secondary | ICD-10-CM | POA: Diagnosis not present

## 2019-05-20 DIAGNOSIS — Z1389 Encounter for screening for other disorder: Secondary | ICD-10-CM | POA: Diagnosis not present

## 2019-05-20 DIAGNOSIS — E119 Type 2 diabetes mellitus without complications: Secondary | ICD-10-CM | POA: Diagnosis not present

## 2019-05-20 DIAGNOSIS — Z0000 Encounter for general adult medical examination without abnormal findings: Secondary | ICD-10-CM | POA: Diagnosis not present

## 2019-05-20 DIAGNOSIS — I5032 Chronic diastolic (congestive) heart failure: Secondary | ICD-10-CM | POA: Diagnosis not present

## 2019-05-21 DIAGNOSIS — I5032 Chronic diastolic (congestive) heart failure: Secondary | ICD-10-CM | POA: Diagnosis not present

## 2019-05-21 DIAGNOSIS — G40909 Epilepsy, unspecified, not intractable, without status epilepticus: Secondary | ICD-10-CM | POA: Diagnosis not present

## 2019-05-21 DIAGNOSIS — Z7901 Long term (current) use of anticoagulants: Secondary | ICD-10-CM | POA: Diagnosis not present

## 2019-05-21 DIAGNOSIS — Z9181 History of falling: Secondary | ICD-10-CM | POA: Diagnosis not present

## 2019-05-21 DIAGNOSIS — E119 Type 2 diabetes mellitus without complications: Secondary | ICD-10-CM | POA: Diagnosis not present

## 2019-05-21 DIAGNOSIS — Z7984 Long term (current) use of oral hypoglycemic drugs: Secondary | ICD-10-CM | POA: Diagnosis not present

## 2019-05-21 DIAGNOSIS — M48062 Spinal stenosis, lumbar region with neurogenic claudication: Secondary | ICD-10-CM | POA: Diagnosis not present

## 2019-05-21 DIAGNOSIS — Z950 Presence of cardiac pacemaker: Secondary | ICD-10-CM | POA: Diagnosis not present

## 2019-05-21 DIAGNOSIS — B029 Zoster without complications: Secondary | ICD-10-CM | POA: Diagnosis not present

## 2019-05-25 ENCOUNTER — Ambulatory Visit (INDEPENDENT_AMBULATORY_CARE_PROVIDER_SITE_OTHER): Payer: Medicare HMO | Admitting: *Deleted

## 2019-05-25 DIAGNOSIS — M48062 Spinal stenosis, lumbar region with neurogenic claudication: Secondary | ICD-10-CM | POA: Diagnosis not present

## 2019-05-25 DIAGNOSIS — I495 Sick sinus syndrome: Secondary | ICD-10-CM | POA: Diagnosis not present

## 2019-05-25 DIAGNOSIS — Z7984 Long term (current) use of oral hypoglycemic drugs: Secondary | ICD-10-CM | POA: Diagnosis not present

## 2019-05-25 DIAGNOSIS — E119 Type 2 diabetes mellitus without complications: Secondary | ICD-10-CM | POA: Diagnosis not present

## 2019-05-25 DIAGNOSIS — Z950 Presence of cardiac pacemaker: Secondary | ICD-10-CM | POA: Diagnosis not present

## 2019-05-25 DIAGNOSIS — I5032 Chronic diastolic (congestive) heart failure: Secondary | ICD-10-CM | POA: Diagnosis not present

## 2019-05-25 DIAGNOSIS — Z7901 Long term (current) use of anticoagulants: Secondary | ICD-10-CM | POA: Diagnosis not present

## 2019-05-25 DIAGNOSIS — B029 Zoster without complications: Secondary | ICD-10-CM | POA: Diagnosis not present

## 2019-05-25 DIAGNOSIS — G40909 Epilepsy, unspecified, not intractable, without status epilepticus: Secondary | ICD-10-CM | POA: Diagnosis not present

## 2019-05-25 DIAGNOSIS — Z9181 History of falling: Secondary | ICD-10-CM | POA: Diagnosis not present

## 2019-05-25 LAB — CUP PACEART REMOTE DEVICE CHECK
Battery Impedance: 231 Ohm
Battery Remaining Longevity: 124 mo
Battery Voltage: 2.79 V
Brady Statistic AP VP Percent: 3 %
Brady Statistic AP VS Percent: 17 %
Brady Statistic AS VP Percent: 0 %
Brady Statistic AS VS Percent: 80 %
Date Time Interrogation Session: 20200706132938
Implantable Lead Implant Date: 20060828
Implantable Lead Implant Date: 20060828
Implantable Lead Location: 753859
Implantable Lead Location: 753860
Implantable Lead Model: 5076
Implantable Lead Model: 5076
Implantable Pulse Generator Implant Date: 20160906
Lead Channel Impedance Value: 381 Ohm
Lead Channel Impedance Value: 504 Ohm
Lead Channel Pacing Threshold Amplitude: 0.875 V
Lead Channel Pacing Threshold Amplitude: 1.875 V
Lead Channel Pacing Threshold Pulse Width: 0.4 ms
Lead Channel Pacing Threshold Pulse Width: 0.4 ms
Lead Channel Setting Pacing Amplitude: 2 V
Lead Channel Setting Pacing Amplitude: 2.5 V
Lead Channel Setting Pacing Pulse Width: 0.64 ms
Lead Channel Setting Sensing Sensitivity: 2.8 mV

## 2019-05-27 DIAGNOSIS — Z9181 History of falling: Secondary | ICD-10-CM | POA: Diagnosis not present

## 2019-05-27 DIAGNOSIS — M48062 Spinal stenosis, lumbar region with neurogenic claudication: Secondary | ICD-10-CM | POA: Diagnosis not present

## 2019-05-27 DIAGNOSIS — G40909 Epilepsy, unspecified, not intractable, without status epilepticus: Secondary | ICD-10-CM | POA: Diagnosis not present

## 2019-05-27 DIAGNOSIS — Z7901 Long term (current) use of anticoagulants: Secondary | ICD-10-CM | POA: Diagnosis not present

## 2019-05-27 DIAGNOSIS — I5032 Chronic diastolic (congestive) heart failure: Secondary | ICD-10-CM | POA: Diagnosis not present

## 2019-05-27 DIAGNOSIS — B029 Zoster without complications: Secondary | ICD-10-CM | POA: Diagnosis not present

## 2019-05-27 DIAGNOSIS — R2689 Other abnormalities of gait and mobility: Secondary | ICD-10-CM | POA: Diagnosis not present

## 2019-05-27 DIAGNOSIS — E119 Type 2 diabetes mellitus without complications: Secondary | ICD-10-CM | POA: Diagnosis not present

## 2019-05-27 DIAGNOSIS — Z7984 Long term (current) use of oral hypoglycemic drugs: Secondary | ICD-10-CM | POA: Diagnosis not present

## 2019-05-27 DIAGNOSIS — Z950 Presence of cardiac pacemaker: Secondary | ICD-10-CM | POA: Diagnosis not present

## 2019-05-28 DIAGNOSIS — Z950 Presence of cardiac pacemaker: Secondary | ICD-10-CM | POA: Diagnosis not present

## 2019-05-28 DIAGNOSIS — B029 Zoster without complications: Secondary | ICD-10-CM | POA: Diagnosis not present

## 2019-05-28 DIAGNOSIS — E119 Type 2 diabetes mellitus without complications: Secondary | ICD-10-CM | POA: Diagnosis not present

## 2019-05-28 DIAGNOSIS — Z7901 Long term (current) use of anticoagulants: Secondary | ICD-10-CM | POA: Diagnosis not present

## 2019-05-28 DIAGNOSIS — I5032 Chronic diastolic (congestive) heart failure: Secondary | ICD-10-CM | POA: Diagnosis not present

## 2019-05-28 DIAGNOSIS — M48062 Spinal stenosis, lumbar region with neurogenic claudication: Secondary | ICD-10-CM | POA: Diagnosis not present

## 2019-05-28 DIAGNOSIS — Z7984 Long term (current) use of oral hypoglycemic drugs: Secondary | ICD-10-CM | POA: Diagnosis not present

## 2019-05-28 DIAGNOSIS — G40909 Epilepsy, unspecified, not intractable, without status epilepticus: Secondary | ICD-10-CM | POA: Diagnosis not present

## 2019-05-28 DIAGNOSIS — Z9181 History of falling: Secondary | ICD-10-CM | POA: Diagnosis not present

## 2019-05-29 IMAGING — DX DG CHEST 2V
2 series · 2 of 2 positions shown · non-contrast
Comparison: Pacemaker placement.

CLINICAL DATA: Pacemaker.

EXAM:
CHEST - 2 VIEW

[chest pa]
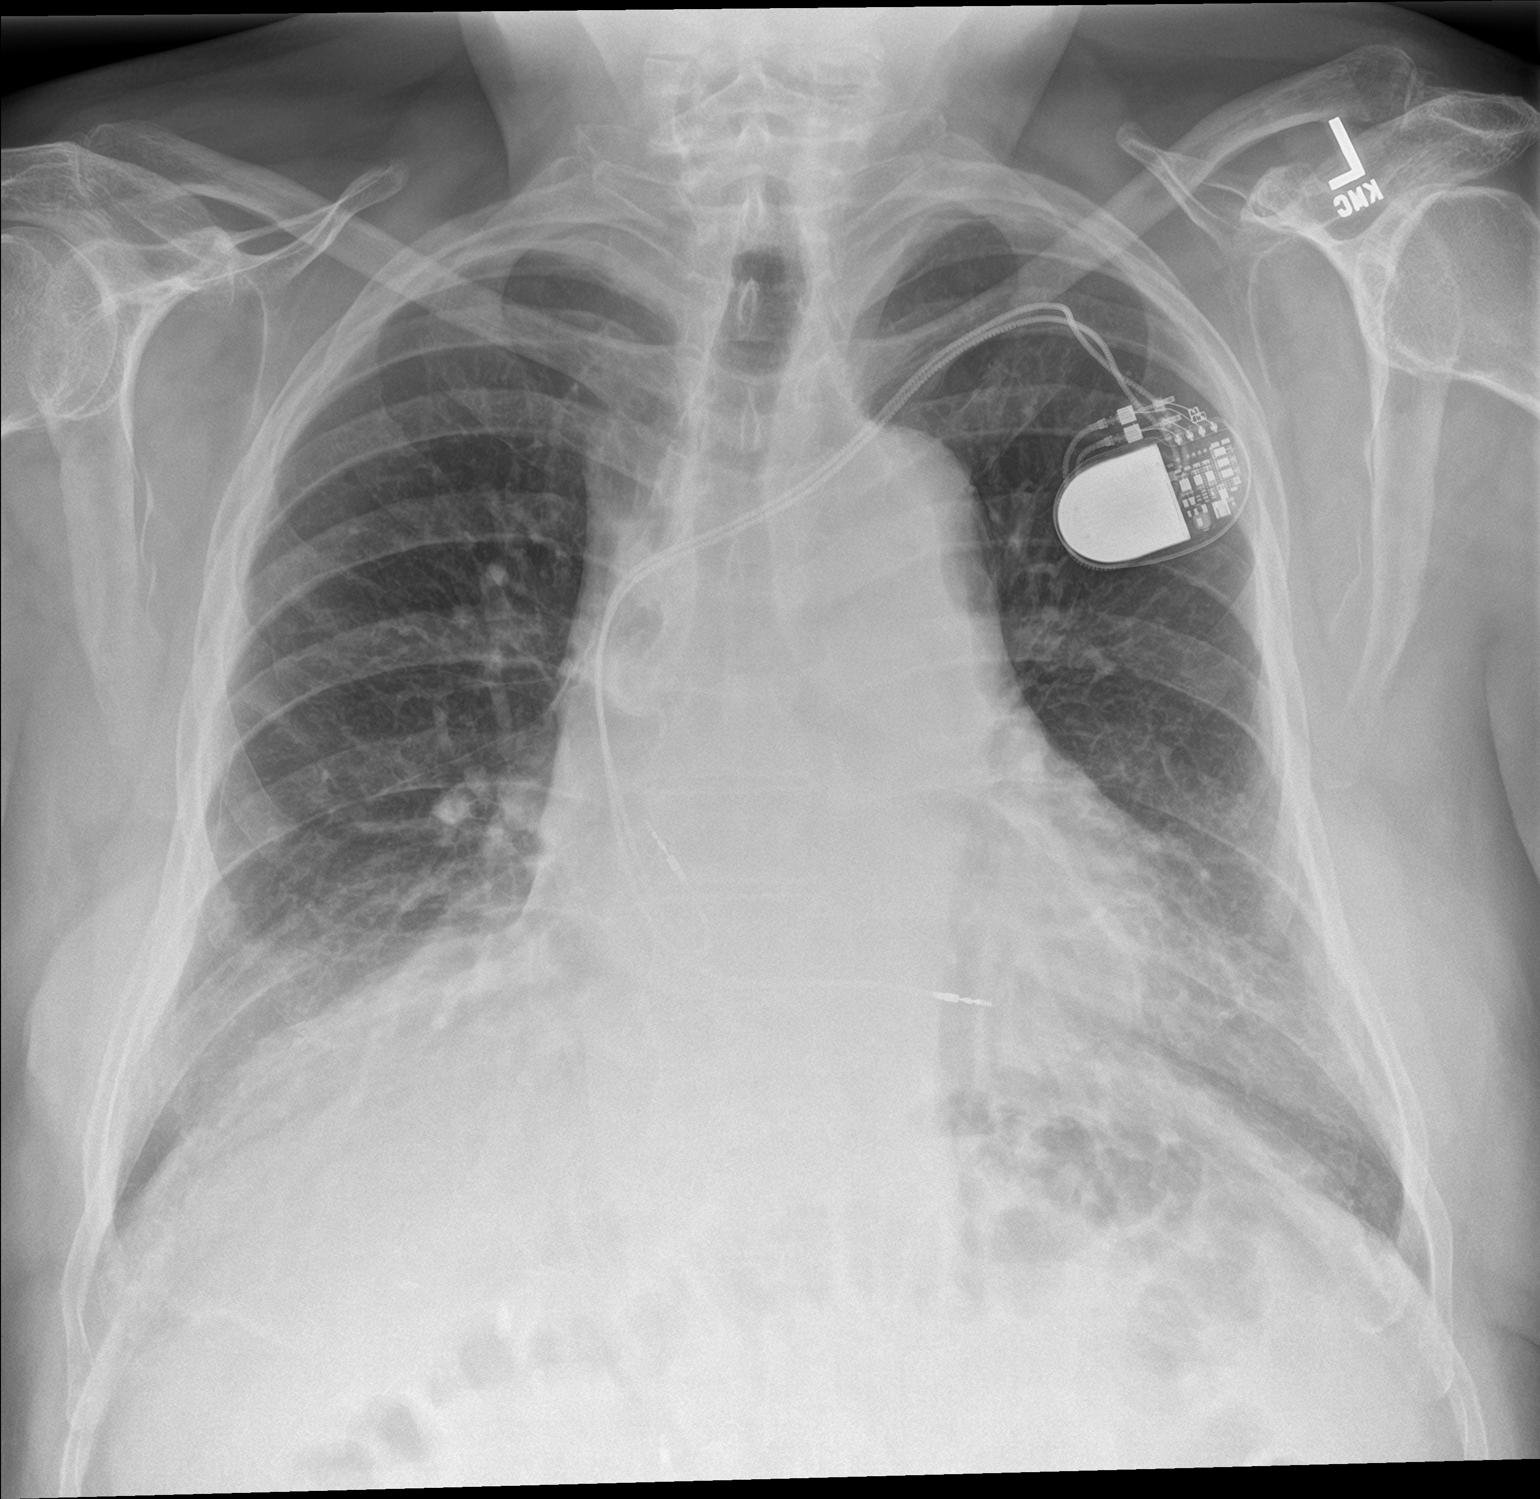

[chest lat]
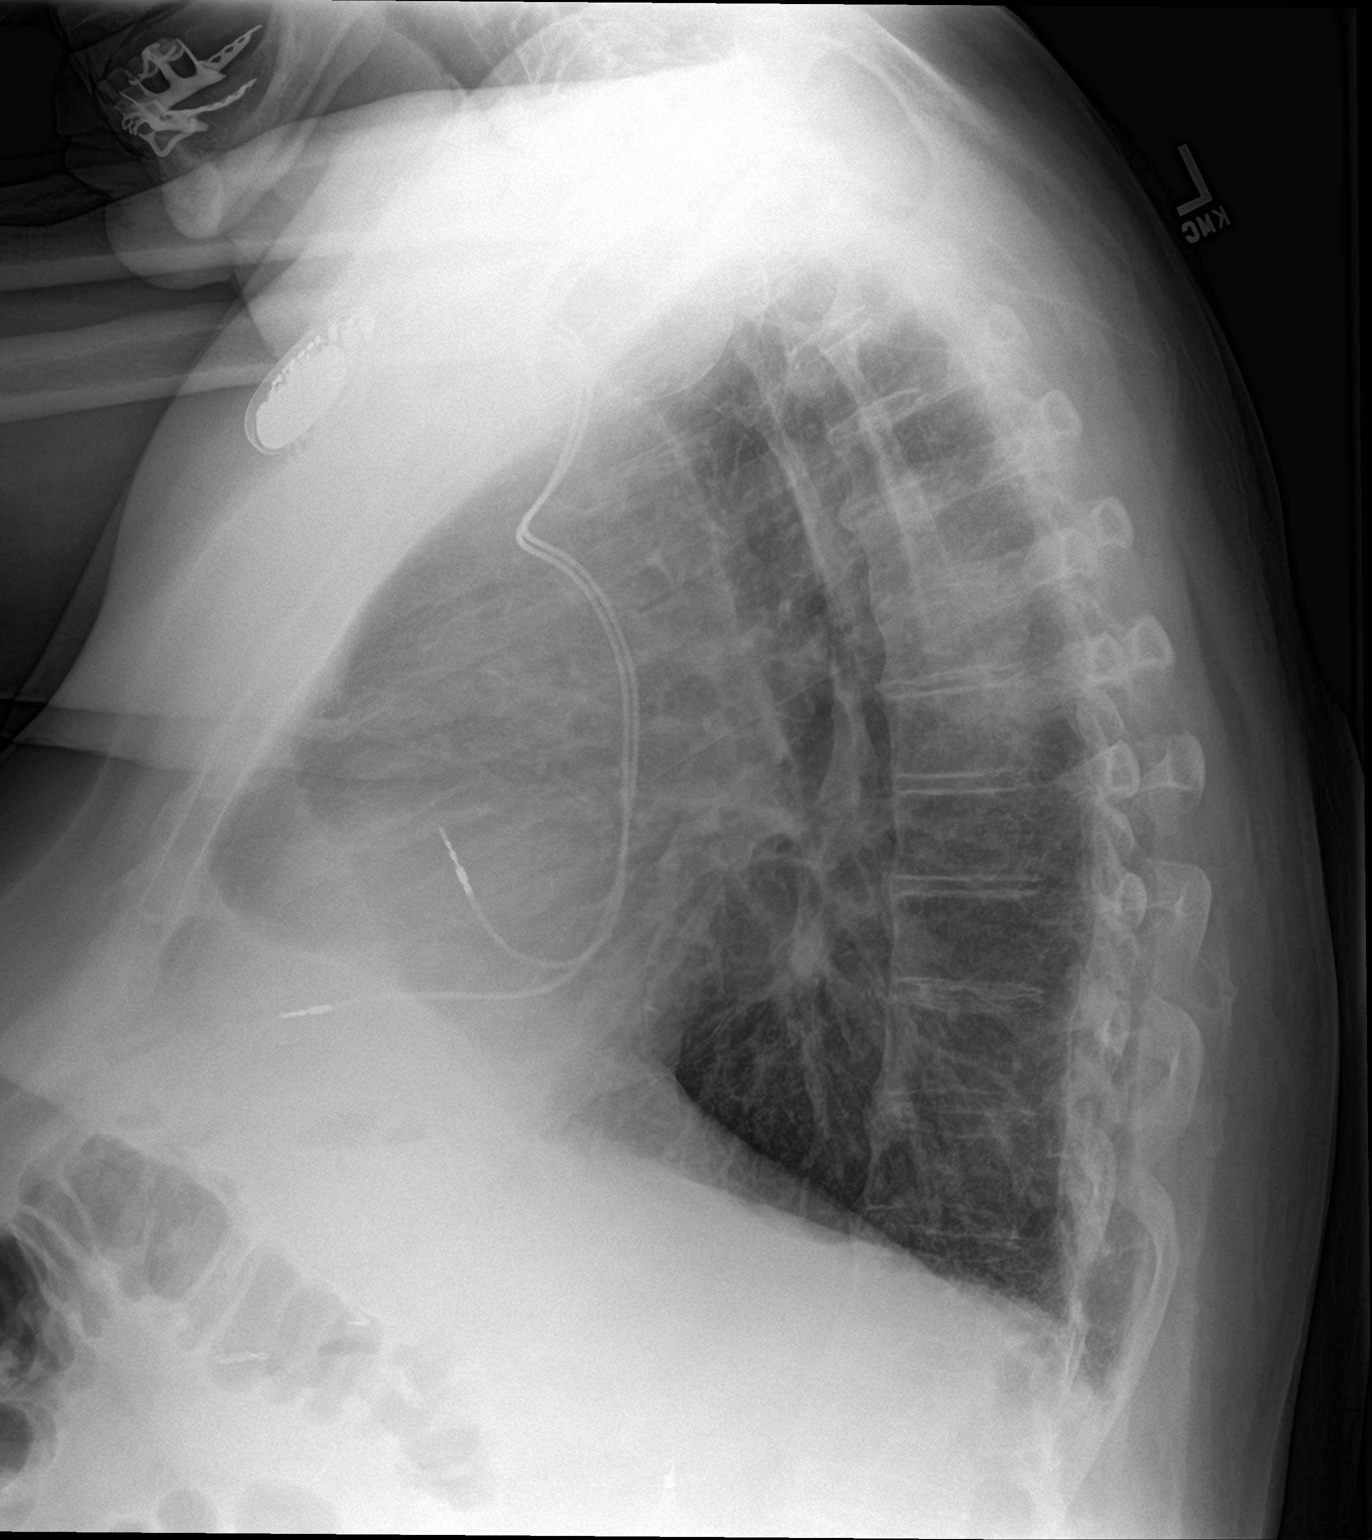

[2 of 2 positions shown; findings below may reference images not displayed]

FINDINGS: The heart size is upper limits of normal. Dual lead pacemaker is
terminate in the right atrium and right ventricle. Changes of COPD
are noted. There is no edema or effusion. No focal airspace disease
is present.
IMPRESSION: 1. Pacemaker leads are position in the right atrium and right
ventricle without radiographic evidence for complication.
2. Changes of COPD.

## 2019-06-01 DIAGNOSIS — M48062 Spinal stenosis, lumbar region with neurogenic claudication: Secondary | ICD-10-CM | POA: Diagnosis not present

## 2019-06-01 DIAGNOSIS — Z7901 Long term (current) use of anticoagulants: Secondary | ICD-10-CM | POA: Diagnosis not present

## 2019-06-01 DIAGNOSIS — Z9181 History of falling: Secondary | ICD-10-CM | POA: Diagnosis not present

## 2019-06-01 DIAGNOSIS — Z7984 Long term (current) use of oral hypoglycemic drugs: Secondary | ICD-10-CM | POA: Diagnosis not present

## 2019-06-01 DIAGNOSIS — G40909 Epilepsy, unspecified, not intractable, without status epilepticus: Secondary | ICD-10-CM | POA: Diagnosis not present

## 2019-06-01 DIAGNOSIS — Z950 Presence of cardiac pacemaker: Secondary | ICD-10-CM | POA: Diagnosis not present

## 2019-06-01 DIAGNOSIS — I5032 Chronic diastolic (congestive) heart failure: Secondary | ICD-10-CM | POA: Diagnosis not present

## 2019-06-01 DIAGNOSIS — B029 Zoster without complications: Secondary | ICD-10-CM | POA: Diagnosis not present

## 2019-06-01 DIAGNOSIS — E119 Type 2 diabetes mellitus without complications: Secondary | ICD-10-CM | POA: Diagnosis not present

## 2019-06-03 DIAGNOSIS — Z7984 Long term (current) use of oral hypoglycemic drugs: Secondary | ICD-10-CM | POA: Diagnosis not present

## 2019-06-03 DIAGNOSIS — Z7901 Long term (current) use of anticoagulants: Secondary | ICD-10-CM | POA: Diagnosis not present

## 2019-06-03 DIAGNOSIS — I5032 Chronic diastolic (congestive) heart failure: Secondary | ICD-10-CM | POA: Diagnosis not present

## 2019-06-03 DIAGNOSIS — Z9181 History of falling: Secondary | ICD-10-CM | POA: Diagnosis not present

## 2019-06-03 DIAGNOSIS — M48062 Spinal stenosis, lumbar region with neurogenic claudication: Secondary | ICD-10-CM | POA: Diagnosis not present

## 2019-06-03 DIAGNOSIS — G40909 Epilepsy, unspecified, not intractable, without status epilepticus: Secondary | ICD-10-CM | POA: Diagnosis not present

## 2019-06-03 DIAGNOSIS — Z950 Presence of cardiac pacemaker: Secondary | ICD-10-CM | POA: Diagnosis not present

## 2019-06-03 DIAGNOSIS — B029 Zoster without complications: Secondary | ICD-10-CM | POA: Diagnosis not present

## 2019-06-03 DIAGNOSIS — E119 Type 2 diabetes mellitus without complications: Secondary | ICD-10-CM | POA: Diagnosis not present

## 2019-06-04 ENCOUNTER — Encounter: Payer: Self-pay | Admitting: Cardiology

## 2019-06-04 NOTE — Progress Notes (Signed)
Remote pacemaker transmission.   

## 2019-06-09 DIAGNOSIS — M48062 Spinal stenosis, lumbar region with neurogenic claudication: Secondary | ICD-10-CM | POA: Diagnosis not present

## 2019-06-09 DIAGNOSIS — M4806 Spinal stenosis, lumbar region: Secondary | ICD-10-CM | POA: Diagnosis not present

## 2019-06-09 DIAGNOSIS — E119 Type 2 diabetes mellitus without complications: Secondary | ICD-10-CM | POA: Diagnosis not present

## 2019-06-09 DIAGNOSIS — G40909 Epilepsy, unspecified, not intractable, without status epilepticus: Secondary | ICD-10-CM | POA: Diagnosis not present

## 2019-06-09 DIAGNOSIS — Z7984 Long term (current) use of oral hypoglycemic drugs: Secondary | ICD-10-CM | POA: Diagnosis not present

## 2019-06-09 DIAGNOSIS — Z950 Presence of cardiac pacemaker: Secondary | ICD-10-CM | POA: Diagnosis not present

## 2019-06-09 DIAGNOSIS — Z9181 History of falling: Secondary | ICD-10-CM | POA: Diagnosis not present

## 2019-06-09 DIAGNOSIS — I5032 Chronic diastolic (congestive) heart failure: Secondary | ICD-10-CM | POA: Diagnosis not present

## 2019-06-09 DIAGNOSIS — B029 Zoster without complications: Secondary | ICD-10-CM | POA: Diagnosis not present

## 2019-06-09 DIAGNOSIS — Z7901 Long term (current) use of anticoagulants: Secondary | ICD-10-CM | POA: Diagnosis not present

## 2019-06-10 ENCOUNTER — Encounter: Payer: Self-pay | Admitting: *Deleted

## 2019-06-11 ENCOUNTER — Ambulatory Visit: Payer: Medicare HMO | Admitting: Cardiology

## 2019-06-11 NOTE — Progress Notes (Deleted)
Cardiology Office Note  Date: 06/11/2019   ID: [name], DOB 08-27-1935, MRN [id number]  PCP:  Neale Burly, MD  Cardiologist:  Rozann Lesches, MD Electrophysiologist:  Thompson Grayer, MD   No chief complaint on file.   History of Present Illness: [name] is an 84 y.o. male last seen in October 2019.  He sees Dr. Rayann Heman in the device clinic, Medtronic pacemaker in place.  Most recent device check showed normal function.  Coumadin is followed by Dr. Sherrie Sport.  Past Medical History:  Diagnosis Date  . Carotid sinus syndrome    Status post pacemaker  . Coronary atherosclerosis of native coronary artery    DES to PL 2006, mild to moderate residual disease 2010   . Essential hypertension, benign   . Mixed hyperlipidemia   . Obstructive sleep apnea    CPAP  . Pacemaker 1986   Medtronic  . Pulmonary emboli [facility])    Diagnosed 2012 and 2014, IVC filter March 2014, recurrent March 8270 - [facility]embolic [facility] at [facility] and placed back on anticoagulation  . Pulmonary hypertension (HCC)    Severe, RVSP 85-90 mmHg March 2014   . Stroke ([place]) 11/2012  . Subdural hematoma [facility])    January 2014, occurred on Coumadin  . Type 2 diabetes mellitus ([place])     Past Surgical History:  Procedure Laterality Date  . arm surgery Right   . CATARACT EXTRACTION W/PHACO Right 02/18/2014   Procedure: CATARACT EXTRACTION PHACO AND INTRAOCULAR LENS PLACEMENT (IOC);  Surgeon: Tonny Branch, MD;  Location: [facility];  Service: Ophthalmology;  Laterality: Right;  CDE: 11.83  . CATARACT EXTRACTION W/PHACO Left 03/25/2014   Procedure: CATARACT EXTRACTION PHACO AND INTRAOCULAR LENS PLACEMENT (IOC);  Surgeon: Tonny Branch, MD;  Location: [facility];  Service: Ophthalmology;  Laterality: Left;  CDE 15.93  . CHOLECYSTECTOMY  2011  . EP IMPLANTABLE DEVICE N/A 07/26/2015   MDT Adapta L gen change by Dr Rayann Heman  . FILTERING PROCEDURE  11/2012   placed due to blood clots from the lungs  . PACEMAKER IMPLANTED    07/16/2005   Medtronic Kappa 900 DR    Current Outpatient Medications  Medication Sig Dispense Refill  . allopurinol (ZYLOPRIM) 300 MG tablet Take 300 mg by mouth daily after breakfast.     . aspirin EC 81 MG tablet Take 81 mg by mouth daily after breakfast.    . atorvastatin (LIPITOR) 40 MG tablet Take 40 mg by mouth daily after breakfast.     . Cholecalciferol 25 MCG (1000 UT) capsule Take 1,000 Units by mouth daily.    . citalopram (CELEXA) 20 MG tablet Take 20 mg by mouth daily.  0  . colchicine 0.6 MG tablet Take 0.6 mg by mouth daily as needed (gout attacks).     . ergocalciferol (VITAMIN D2) 1.25 MG (50000 UT) capsule Take 50,000 Units by mouth once a week.    . fenofibrate 160 MG tablet Take 160 mg by mouth daily.    . ferrous sulfate 325 (65 FE) MG tablet Take 325 mg by mouth 3 (three) times daily with meals.    Marland Kitchen glimepiride (AMARYL) 2 MG tablet Take 2 mg by mouth 2 (two) times daily.     Marland Kitchen levothyroxine (SYNTHROID, LEVOTHROID) 25 MCG tablet Take 25 mcg by mouth daily before breakfast.    . meclizine (ANTIVERT) 25 MG tablet Take 25 mg by mouth 3 (three) times daily as needed for dizziness.    . metFORMIN (GLUCOPHAGE)  1000 MG tablet Take 1,000 mg by mouth 2 (two) times daily with a meal.    . midodrine (PROAMATINE) 10 MG tablet Take 10 mg by mouth 3 (three) times daily.    . Omega-3 Fatty Acids (FISH OIL) 1000 MG CAPS Take 1 capsule by mouth 3 (three) times daily.    Marland Kitchen omeprazole (PRILOSEC) 40 MG capsule Take 40 mg by mouth daily.      . ondansetron (ZOFRAN) 4 MG tablet Take 4 mg by mouth every 4 (four) hours as needed for nausea or vomiting.    . sitaGLIPtin (JANUVIA) 100 MG tablet Take 100 mg by mouth daily.    . vitamin B-12 (CYANOCOBALAMIN) 1000 MCG tablet Take 1,000 mcg by mouth daily.    Marland Kitchen warfarin (COUMADIN) 3 MG tablet Take 3 mg by mouth every other day. Alternate with 4 mg tablet (daily after breakfast) or as directed by Dr. Natasha Bence    . warfarin (COUMADIN) 4 MG tablet Take  4 mg by mouth every other day. Alternate with 3 mg tablet (daily after breakfast) or as directed by Dr. Natasha Bence     No current facility-administered medications for this visit.    Allergies:  Nitroglycerin   Social History: The patient  reports that he has never smoked. He has never used smokeless tobacco. He reports that he does not drink alcohol or use drugs.   Family History: The patient's family history includes Diabetes in an other family member; Hypertension in an other family member.   ROS:  Please see the history of present illness. Otherwise, complete review of systems is positive for {NONE DEFAULTED:18576::"none"}.  All other systems are reviewed and negative.   Physical Exam: VS:  There were no vitals taken for this visit., BMI There is no height or weight on file to calculate BMI.  Wt Readings from Last 3 Encounters:  09/10/18 268 lb (121.6 kg)  08/22/18 276 lb (125.2 kg)  08/23/17 278 lb (126.1 kg)    General: Patient appears comfortable at rest. HEENT: Conjunctiva and lids normal, oropharynx clear with moist mucosa. Neck: Supple, no elevated JVP or carotid bruits, no thyromegaly. Lungs: Clear to auscultation, nonlabored breathing at rest. Cardiac: Regular rate and rhythm, no S3 or significant systolic murmur, no pericardial rub. Abdomen: Soft, nontender, no hepatomegaly, bowel sounds present, no guarding or rebound. Extremities: No pitting edema, distal pulses 2+. Skin: Warm and dry. Musculoskeletal: No kyphosis. Neuropsychiatric: Alert and oriented x3, affect grossly appropriate.  ECG:  An ECG dated 09/04/2018 was personally reviewed today and demonstrated:  Sinus rhythm.  Recent Labwork:  November 2017: BUN 20, creatinine 1.1, potassium 3.3, AST 49, ALT 31, troponin T negative 3, hemoglobin 12.0, platelets 238  Other Studies Reviewed Today:  Echocardiogram 12/17/2013: Study Conclusions  - Left ventricle: The cavity size was normal. Wall thickness was  increased in a pattern of mild LVH. Systolic function was normal. The estimated ejection fraction was in the range of 60% to 65%. There is hypokinesis of the basalinferior myocardium. Doppler parameters are consistent with abnormal left ventricular relaxation (grade 1 diastolic dysfunction). - Aortic valve: Mildly calcified annulus. Trileaflet; mildly thickened leaflets. Trivial regurgitation. - Mitral valve: Calcified annulus. Mildly thickened leaflets . Mild regurgitation directed eccentrically. - Left atrium: The atrium was mildly dilated. The appendage was mildly to moderately dilated. - Right ventricle: Pacer wire or catheter noted in right ventricle. - Tricuspid valve: Mild regurgitation. Peak RV-RA gradient: 57m Hg (S). - Pulmonary arteries: Systolic pressure could not be accurately estimated.  Likely mildly increased based on RV-RA gradient, assuming normal CVP. - Inferior vena cava: Not visualized. Unable toestimate CVP. - Pericardium, extracardiac: A prominent pericardial fat pad was present.  Impressions:  - Unable to compare directly with prior study March 2014. Mild LVH with LVEF 60-65%, basal inferior hypokinesis, grade 1 diastolic dysfunction. Mild left atrial enlargement. MAC with mild mitral regurgitation. Device wire noted in the right heart. Mild tricuspid regurgitation. RV-RA gradient 38 mmHg, likely mildly increased PASP assuming normal CVP. This is significantly improved compared with prior report.  Assessment and Plan:    Medication Adjustments/Labs and Tests Ordered: Current medicines are reviewed at length with the patient today.  Concerns regarding medicines are outlined above.   Tests Ordered: No orders of the defined types were placed in this encounter.   Medication Changes: No orders of the defined types were placed in this encounter.   Disposition:  Follow up {follow up:15908}  Signed, Satira Sark, MD, [facility] 06/11/2019 12:35 PM    [place] [facility] [place], [place], [place] [address] Phone: [phone]; Fax: [phone]

## 2019-06-15 DIAGNOSIS — I5032 Chronic diastolic (congestive) heart failure: Secondary | ICD-10-CM | POA: Diagnosis not present

## 2019-06-15 DIAGNOSIS — M48062 Spinal stenosis, lumbar region with neurogenic claudication: Secondary | ICD-10-CM | POA: Diagnosis not present

## 2019-06-15 DIAGNOSIS — G40909 Epilepsy, unspecified, not intractable, without status epilepticus: Secondary | ICD-10-CM | POA: Diagnosis not present

## 2019-06-15 DIAGNOSIS — E119 Type 2 diabetes mellitus without complications: Secondary | ICD-10-CM | POA: Diagnosis not present

## 2019-06-15 DIAGNOSIS — Z7984 Long term (current) use of oral hypoglycemic drugs: Secondary | ICD-10-CM | POA: Diagnosis not present

## 2019-06-15 DIAGNOSIS — Z7901 Long term (current) use of anticoagulants: Secondary | ICD-10-CM | POA: Diagnosis not present

## 2019-06-15 DIAGNOSIS — Z9181 History of falling: Secondary | ICD-10-CM | POA: Diagnosis not present

## 2019-06-15 DIAGNOSIS — Z950 Presence of cardiac pacemaker: Secondary | ICD-10-CM | POA: Diagnosis not present

## 2019-06-15 DIAGNOSIS — B029 Zoster without complications: Secondary | ICD-10-CM | POA: Diagnosis not present

## 2019-06-23 DIAGNOSIS — E119 Type 2 diabetes mellitus without complications: Secondary | ICD-10-CM | POA: Diagnosis not present

## 2019-06-23 DIAGNOSIS — M48062 Spinal stenosis, lumbar region with neurogenic claudication: Secondary | ICD-10-CM | POA: Diagnosis not present

## 2019-06-23 DIAGNOSIS — B029 Zoster without complications: Secondary | ICD-10-CM | POA: Diagnosis not present

## 2019-06-23 DIAGNOSIS — G40909 Epilepsy, unspecified, not intractable, without status epilepticus: Secondary | ICD-10-CM | POA: Diagnosis not present

## 2019-06-23 DIAGNOSIS — Z7901 Long term (current) use of anticoagulants: Secondary | ICD-10-CM | POA: Diagnosis not present

## 2019-06-23 DIAGNOSIS — Z9181 History of falling: Secondary | ICD-10-CM | POA: Diagnosis not present

## 2019-06-23 DIAGNOSIS — I5032 Chronic diastolic (congestive) heart failure: Secondary | ICD-10-CM | POA: Diagnosis not present

## 2019-06-23 DIAGNOSIS — Z7984 Long term (current) use of oral hypoglycemic drugs: Secondary | ICD-10-CM | POA: Diagnosis not present

## 2019-06-23 DIAGNOSIS — Z950 Presence of cardiac pacemaker: Secondary | ICD-10-CM | POA: Diagnosis not present

## 2019-06-24 DIAGNOSIS — R197 Diarrhea, unspecified: Secondary | ICD-10-CM | POA: Diagnosis not present

## 2019-06-27 DIAGNOSIS — R2689 Other abnormalities of gait and mobility: Secondary | ICD-10-CM | POA: Diagnosis not present

## 2019-06-30 DIAGNOSIS — Z950 Presence of cardiac pacemaker: Secondary | ICD-10-CM | POA: Diagnosis not present

## 2019-06-30 DIAGNOSIS — M48062 Spinal stenosis, lumbar region with neurogenic claudication: Secondary | ICD-10-CM | POA: Diagnosis not present

## 2019-06-30 DIAGNOSIS — G40909 Epilepsy, unspecified, not intractable, without status epilepticus: Secondary | ICD-10-CM | POA: Diagnosis not present

## 2019-06-30 DIAGNOSIS — E119 Type 2 diabetes mellitus without complications: Secondary | ICD-10-CM | POA: Diagnosis not present

## 2019-06-30 DIAGNOSIS — B029 Zoster without complications: Secondary | ICD-10-CM | POA: Diagnosis not present

## 2019-06-30 DIAGNOSIS — Z7901 Long term (current) use of anticoagulants: Secondary | ICD-10-CM | POA: Diagnosis not present

## 2019-06-30 DIAGNOSIS — Z7984 Long term (current) use of oral hypoglycemic drugs: Secondary | ICD-10-CM | POA: Diagnosis not present

## 2019-06-30 DIAGNOSIS — Z9181 History of falling: Secondary | ICD-10-CM | POA: Diagnosis not present

## 2019-06-30 DIAGNOSIS — I5032 Chronic diastolic (congestive) heart failure: Secondary | ICD-10-CM | POA: Diagnosis not present

## 2019-07-14 ENCOUNTER — Telehealth: Payer: Self-pay | Admitting: Cardiology

## 2019-07-14 NOTE — Telephone Encounter (Signed)
Numerous attempts to contact patient with recall letters. Unable to reach by telephone. with no success.   

## 2019-07-24 DIAGNOSIS — R5381 Other malaise: Secondary | ICD-10-CM | POA: Diagnosis not present

## 2019-07-24 DIAGNOSIS — R69 Illness, unspecified: Secondary | ICD-10-CM | POA: Diagnosis not present

## 2019-08-21 ENCOUNTER — Encounter: Payer: Medicare HMO | Admitting: Internal Medicine

## 2019-08-24 ENCOUNTER — Ambulatory Visit (INDEPENDENT_AMBULATORY_CARE_PROVIDER_SITE_OTHER): Payer: Medicare HMO | Admitting: *Deleted

## 2019-08-24 DIAGNOSIS — I495 Sick sinus syndrome: Secondary | ICD-10-CM

## 2019-08-25 LAB — CUP PACEART REMOTE DEVICE CHECK
Battery Impedance: 231 Ohm
Battery Remaining Longevity: 125 mo
Battery Voltage: 2.79 V
Brady Statistic AP VP Percent: 3 %
Brady Statistic AP VS Percent: 17 %
Brady Statistic AS VP Percent: 0 %
Brady Statistic AS VS Percent: 80 %
Date Time Interrogation Session: 20201005134909
Implantable Lead Implant Date: 20060828
Implantable Lead Implant Date: 20060828
Implantable Lead Location: 753859
Implantable Lead Location: 753860
Implantable Lead Model: 5076
Implantable Lead Model: 5076
Implantable Pulse Generator Implant Date: 20160906
Lead Channel Impedance Value: 380 Ohm
Lead Channel Impedance Value: 547 Ohm
Lead Channel Pacing Threshold Amplitude: 0.875 V
Lead Channel Pacing Threshold Amplitude: 1.75 V
Lead Channel Pacing Threshold Pulse Width: 0.4 ms
Lead Channel Pacing Threshold Pulse Width: 0.4 ms
Lead Channel Setting Pacing Amplitude: 2 V
Lead Channel Setting Pacing Amplitude: 2.5 V
Lead Channel Setting Pacing Pulse Width: 0.64 ms
Lead Channel Setting Sensing Sensitivity: 2.8 mV

## 2019-08-27 DIAGNOSIS — I5032 Chronic diastolic (congestive) heart failure: Secondary | ICD-10-CM | POA: Diagnosis not present

## 2019-08-27 DIAGNOSIS — M48062 Spinal stenosis, lumbar region with neurogenic claudication: Secondary | ICD-10-CM | POA: Diagnosis not present

## 2019-08-27 DIAGNOSIS — E1121 Type 2 diabetes mellitus with diabetic nephropathy: Secondary | ICD-10-CM | POA: Diagnosis not present

## 2019-08-27 DIAGNOSIS — G40919 Epilepsy, unspecified, intractable, without status epilepticus: Secondary | ICD-10-CM | POA: Diagnosis not present

## 2019-08-27 DIAGNOSIS — E7849 Other hyperlipidemia: Secondary | ICD-10-CM | POA: Diagnosis not present

## 2019-08-27 DIAGNOSIS — K219 Gastro-esophageal reflux disease without esophagitis: Secondary | ICD-10-CM | POA: Diagnosis not present

## 2019-08-27 DIAGNOSIS — Z6836 Body mass index (BMI) 36.0-36.9, adult: Secondary | ICD-10-CM | POA: Diagnosis not present

## 2019-08-27 DIAGNOSIS — N182 Chronic kidney disease, stage 2 (mild): Secondary | ICD-10-CM | POA: Diagnosis not present

## 2019-08-27 DIAGNOSIS — E038 Other specified hypothyroidism: Secondary | ICD-10-CM | POA: Diagnosis not present

## 2019-09-01 NOTE — Progress Notes (Signed)
Remote pacemaker transmission.   

## 2019-10-23 ENCOUNTER — Telehealth (INDEPENDENT_AMBULATORY_CARE_PROVIDER_SITE_OTHER): Payer: Medicare HMO | Admitting: Internal Medicine

## 2019-10-23 ENCOUNTER — Encounter: Payer: Self-pay | Admitting: Internal Medicine

## 2019-10-23 VITALS — BP 131/83 | Ht 73.0 in | Wt 267.0 lb

## 2019-10-23 DIAGNOSIS — I10 Essential (primary) hypertension: Secondary | ICD-10-CM

## 2019-10-23 DIAGNOSIS — I471 Supraventricular tachycardia: Secondary | ICD-10-CM | POA: Diagnosis not present

## 2019-10-23 DIAGNOSIS — I495 Sick sinus syndrome: Secondary | ICD-10-CM

## 2019-10-23 NOTE — Progress Notes (Signed)
Electrophysiology TeleHealth Note  Due to national recommendations of social distancing due to [place] 19, an audio telehealth visit is felt to be most appropriate for this patient at this time.  Verbal consent was obtained by me for the telehealth visit today.  The patient does not have capability for a virtual visit.  A phone visit is therefore required today.   Date:  10/23/2019   ID:  CIRO TASHIRO, DOB 1935-04-01, MRN [id number]  Location: patient's home  Provider location:  [facility]  Evaluation Performed: Follow-up visit  PCP:  Neale Burly, MD   Electrophysiologist:  Dr Rayann Heman  Chief Complaint:  Pacemaker follow up  History of Present Illness:    MACKY GALIK is a 84 y.o. male who presents via telehealth conferencing today.  Since last being seen in our clinic, the patient reports ongoing frailty and weakness.  The visit is done with the help of his wife today.  He is slowly recovering from shingles (4 months).  He is primarily in the bed. He is participating in therapy at home. Today, he denies symptoms of palpitations, chest pain, shortness of breath (above baseline),  lower extremity edema (above baseline), dizziness, presyncope, or syncope.  The patient is otherwise without complaint today.  The patient denies symptoms of fevers, chills, cough, or new SOB worrisome for COVID 19.  Past Medical History:  Diagnosis Date  . Carotid sinus syndrome    Status post pacemaker  . Coronary atherosclerosis of native coronary artery    DES to PL 2006, mild to moderate residual disease 2010   . Essential hypertension, benign   . Mixed hyperlipidemia   . Obstructive sleep apnea    CPAP  . Pacemaker 1986   Medtronic  . Pulmonary emboli [facility])    Diagnosed 2012 and 2014, IVC filter March 2014, recurrent March 3267 - thromboembolic therapy at [facility] and placed back on anticoagulation  . Pulmonary hypertension (HCC)    Severe, RVSP 85-90 mmHg March 2014   . Stroke ([place])  11/2012  . Subdural hematoma [facility])    January 2014, occurred on Coumadin  . Type 2 diabetes mellitus ([place])     Past Surgical History:  Procedure Laterality Date  . arm surgery Right   . CATARACT EXTRACTION W/PHACO Right 02/18/2014   Procedure: CATARACT EXTRACTION PHACO AND INTRAOCULAR LENS PLACEMENT (IOC);  Surgeon: Tonny Branch, MD;  Location: [facility];  Service: Ophthalmology;  Laterality: Right;  CDE: 11.83  . CATARACT EXTRACTION W/PHACO Left 03/25/2014   Procedure: CATARACT EXTRACTION PHACO AND INTRAOCULAR LENS PLACEMENT (IOC);  Surgeon: Tonny Branch, MD;  Location: [facility];  Service: Ophthalmology;  Laterality: Left;  CDE 15.93  . CHOLECYSTECTOMY  2011  . EP IMPLANTABLE DEVICE N/A 07/26/2015   MDT Adapta L gen change by Dr Rayann Heman  . FILTERING PROCEDURE  11/2012   placed due to blood clots from the lungs  . PACEMAKER IMPLANTED   07/16/2005   Medtronic Kappa 900 DR    Current Outpatient Medications  Medication Sig Dispense Refill  . allopurinol (ZYLOPRIM) 300 MG tablet Take 300 mg by mouth daily after breakfast.     . aspirin EC 81 MG tablet Take 81 mg by mouth daily after breakfast.    . atorvastatin (LIPITOR) 40 MG tablet Take 40 mg by mouth daily after breakfast.     . Cholecalciferol 25 MCG (1000 UT) capsule Take 1,000 Units by mouth daily.    . citalopram (CELEXA) 20 MG tablet Take  20 mg by mouth daily.  0  . colchicine 0.6 MG tablet Take 0.6 mg by mouth daily as needed (gout attacks).     . ergocalciferol (VITAMIN D2) 1.25 MG (50000 UT) capsule Take 50,000 Units by mouth once a week.    . fenofibrate 160 MG tablet Take 160 mg by mouth daily.    . ferrous sulfate 325 (65 FE) MG tablet Take 325 mg by mouth 3 (three) times daily with meals.    Marland Kitchen. glimepiride (AMARYL) 2 MG tablet Take 2 mg by mouth 2 (two) times daily.     Marland Kitchen. levETIRAcetam (KEPPRA) 500 MG tablet Take 500 mg by mouth 2 (two) times daily.    Marland Kitchen. levothyroxine (SYNTHROID, LEVOTHROID) 25 MCG tablet Take 25 mcg by mouth daily before  breakfast.    . meclizine (ANTIVERT) 25 MG tablet Take 25 mg by mouth 3 (three) times daily as needed for dizziness.    . metFORMIN (GLUCOPHAGE) 1000 MG tablet Take 1,000 mg by mouth 2 (two) times daily with a meal.    . midodrine (PROAMATINE) 2.5 MG tablet Take 2.5 mg by mouth 3 (three) times daily with meals.    . Omega-3 Fatty Acids (FISH OIL) 1000 MG CAPS Take 1 capsule by mouth 3 (three) times daily.    Marland Kitchen. omeprazole (PRILOSEC) 40 MG capsule Take 40 mg by mouth daily.      . ondansetron (ZOFRAN) 4 MG tablet Take 4 mg by mouth every 4 (four) hours as needed for nausea or vomiting.    . sitaGLIPtin (JANUVIA) 100 MG tablet Take 100 mg by mouth daily.    . vitamin B-12 (CYANOCOBALAMIN) 1000 MCG tablet Take 1,000 mcg by mouth daily.    Marland Kitchen. warfarin (COUMADIN) 3 MG tablet Take 3 mg by mouth every other day. Alternate with 4 mg tablet (daily after breakfast) or as directed by Dr. Jeronimo GreavesHasana    . warfarin (COUMADIN) 4 MG tablet Take 4 mg by mouth every other day. Alternate with 3 mg tablet (daily after breakfast) or as directed by Dr. Jeronimo GreavesHasana     No current facility-administered medications for this visit.     Allergies:   Nitroglycerin   Social History:  The patient  reports that he has never smoked. He has never used smokeless tobacco. He reports that he does not drink alcohol or use drugs.   Family History:  The patient's  family history includes Diabetes in an other family member; Hypertension in an other family member.   ROS:  Please see the history of present illness.   All other systems are personally reviewed and negative.    Exam:    Vital Signs:  BP 131/83   Ht 6\' 1"  (1.854 m)   Wt 267 lb (121.1 kg)   BMI 35.23 kg/m   Well sounding, alert and conversant, regular work of breathing   Labs/Other Tests and Data Reviewed:    Recent Labs: No results found for requested labs within last 8760 hours.   Wt Readings from Last 3 Encounters:  10/23/19 267 lb (121.1 kg)  09/10/18 268 lb  (121.6 kg)  08/22/18 276 lb (125.2 kg)     Last device remote is reviewed from PaceART PDF which reveals normal device function, chronic short noise on RV lead    ASSESSMENT & PLAN:    1.  Symptomatic sinus bradycardia Stable device function, RV lead programmed unipolar Short episodes of noise on V lead, pt rarely V paces See recent PaceArt report  2.  Atach/SVT No significant recurrence  3.  Prior PTE Continue long term anticoagulation  4.  HTN Stable No change required today   Follow-up:  [facility], me in 1 year. He is due to see Dr Diona Browner.   Patient Risk:  after full review of this patients clinical status, I feel that they are at moderate risk at this time.  Today, I have spent 15 minutes with the patient with telehealth technology discussing arrhythmia management .    Randolm Idol, MD  10/23/2019 10:22 AM     [facility] [address] [place] [address] [phone] (office) [phone] (fax)

## 2019-11-23 ENCOUNTER — Ambulatory Visit (INDEPENDENT_AMBULATORY_CARE_PROVIDER_SITE_OTHER): Payer: Medicare HMO | Admitting: *Deleted

## 2019-11-23 DIAGNOSIS — I495 Sick sinus syndrome: Secondary | ICD-10-CM | POA: Diagnosis not present

## 2019-11-23 LAB — CUP PACEART REMOTE DEVICE CHECK
Battery Impedance: 255 Ohm
Battery Remaining Longevity: 121 mo
Battery Voltage: 2.79 V
Brady Statistic AP VP Percent: 3 %
Brady Statistic AP VS Percent: 17 %
Brady Statistic AS VP Percent: 0 %
Brady Statistic AS VS Percent: 80 %
Date Time Interrogation Session: 20210104081525
Implantable Lead Implant Date: 20060828
Implantable Lead Implant Date: 20060828
Implantable Lead Location: 753859
Implantable Lead Location: 753860
Implantable Lead Model: 5076
Implantable Lead Model: 5076
Implantable Pulse Generator Implant Date: 20160906
Lead Channel Impedance Value: 395 Ohm
Lead Channel Impedance Value: 524 Ohm
Lead Channel Pacing Threshold Amplitude: 0.875 V
Lead Channel Pacing Threshold Amplitude: 1.375 V
Lead Channel Pacing Threshold Pulse Width: 0.4 ms
Lead Channel Pacing Threshold Pulse Width: 0.4 ms
Lead Channel Setting Pacing Amplitude: 2 V
Lead Channel Setting Pacing Amplitude: 2.5 V
Lead Channel Setting Pacing Pulse Width: 0.64 ms
Lead Channel Setting Sensing Sensitivity: 2.8 mV

## 2019-11-27 ENCOUNTER — Telehealth (INDEPENDENT_AMBULATORY_CARE_PROVIDER_SITE_OTHER): Payer: Medicare Other | Admitting: Cardiology

## 2019-11-27 ENCOUNTER — Encounter: Payer: Self-pay | Admitting: Cardiology

## 2019-11-27 VITALS — Ht 73.0 in | Wt 271.0 lb

## 2019-11-27 DIAGNOSIS — I2699 Other pulmonary embolism without acute cor pulmonale: Secondary | ICD-10-CM

## 2019-11-27 DIAGNOSIS — I951 Orthostatic hypotension: Secondary | ICD-10-CM | POA: Diagnosis not present

## 2019-11-27 DIAGNOSIS — I495 Sick sinus syndrome: Secondary | ICD-10-CM

## 2019-11-27 DIAGNOSIS — I471 Supraventricular tachycardia: Secondary | ICD-10-CM

## 2019-11-27 DIAGNOSIS — I25119 Atherosclerotic heart disease of native coronary artery with unspecified angina pectoris: Secondary | ICD-10-CM

## 2019-11-27 NOTE — Progress Notes (Signed)
Virtual Visit via Telephone Note   This visit type was conducted due to national recommendations for restrictions regarding the COVID-19 Pandemic (e.g. social distancing) in an effort to limit this patient's exposure and mitigate transmission in our community.  Due to his co-morbid illnesses, this patient is at least at moderate risk for complications without adequate follow up.  This format is felt to be most appropriate for this patient at this time.  The patient did not have access to video technology/had technical difficulties with video requiring transitioning to audio format only (telephone).  All issues noted in this document were discussed and addressed.  No physical exam could be performed with this format.  Please refer to the patient's chart for his  consent to telehealth for [facility].   Date:  11/27/2019   ID:  [name], DOB 29-Oct-1935, MRN [id number]  Patient Location: Home Provider Location: Home  PCP:  Neale Burly, MD  Cardiologist:  Rozann Lesches, MD Electrophysiologist:  Thompson Grayer, MD   Evaluation Performed:  Follow-Up Visit  Chief Complaint:  Cardiac follow-up  History of Present Illness:    [name] is an 85 y.o. male last seen in October 2019.  I spoke with the patient's wife by phone, he listened to the conversation as well although did not verbalize.  His wife tells me that he has had a steadily declining course in terms of difficulty ambulating and recurrent falls.  She states that his knees "give out."  He can use a walker only to very limited degree and is in the bed a lot of the time.  He has a lift chair as well.  She is looking any into other options for help, may even consider nursing home stay.  She plans to talk with Dr. Sherrie Sport about this.  He follows with Dr. Rayann Heman in the device clinic, Medtronic pacemaker in place.  Recent device interrogation noted.  Overall normal function with episodes of atrial tachycardia that have been  documented previously.  He is on Coumadin with follow-up by Dr. Sherrie Sport (history of pulmonary emboli).  Patient's wife states that he has not had any obvious bleeding problems.  I reviewed his medications.  From a cardiac perspective he is also on aspirin, Lipitor, fenofibrate, and Proamatine.  He has a history of orthostasis and is not on any antihypertensives.  Also has not tolerated nitroglycerin in the past due to hypotension.  The patient does not have symptoms concerning for COVID-19 infection (fever, chills, cough, or new shortness of breath).    Past Medical History:  Diagnosis Date  . Carotid sinus syndrome    Status post pacemaker  . Coronary atherosclerosis of native coronary artery    DES to PL 2006, mild to moderate residual disease 2010   . Essential hypertension   . Mixed hyperlipidemia   . Obstructive sleep apnea    CPAP  . Pacemaker 1986   Medtronic  . Pulmonary emboli [facility])    Diagnosed 2012 and 2014, IVC filter March 2014, recurrent March 1583 - throm[facility]embolic therapy at [facility] and placed back on anticoagulation  . Pulmonary hypertension (HCC)    Severe, RVSP 85-90 mmHg March 2014   . Stroke ([place]) 11/2012  . Subdural hematoma [facility])    January 2014, occurred on Coumadin  . Type 2 diabetes mellitus ([place])    Past Surgical History:  Procedure Laterality Date  . Arm surgery Right   . CATARACT EXTRACTION W/PHACO Right 02/18/2014   Procedure: CATARACT  EXTRACTION PHACO AND INTRAOCULAR LENS PLACEMENT (IOC);  Surgeon: Tonny Branch, MD;  Location: [facility];  Service: Ophthalmology;  Laterality: Right;  CDE: 11.83  . CATARACT EXTRACTION W/PHACO Left 03/25/2014   Procedure: CATARACT EXTRACTION PHACO AND INTRAOCULAR LENS PLACEMENT (IOC);  Surgeon: Tonny Branch, MD;  Location: [facility];  Service: Ophthalmology;  Laterality: Left;  CDE 15.93  . CHOLECYSTECTOMY  2011  . EP IMPLANTABLE DEVICE N/A 07/26/2015   MDT Adapta L gen change by Dr Rayann Heman  . FILTERING PROCEDURE  11/2012   placed due to  blood clots from the lungs  . PACEMAKER IMPLANTED   07/16/2005   Medtronic Kappa 900 DR     Current Meds  Medication Sig  . allopurinol (ZYLOPRIM) 300 MG tablet Take 300 mg by mouth daily after breakfast.   . aspirin EC 81 MG tablet Take 81 mg by mouth daily after breakfast.  . atorvastatin (LIPITOR) 40 MG tablet Take 40 mg by mouth daily after breakfast.   . Cholecalciferol 25 MCG (1000 UT) capsule Take 1,000 Units by mouth daily.  . citalopram (CELEXA) 20 MG tablet Take 20 mg by mouth daily.  . colchicine 0.6 MG tablet Take 0.6 mg by mouth daily as needed (gout attacks).   . ergocalciferol (VITAMIN D2) 1.25 MG (50000 UT) capsule Take 50,000 Units by mouth once a week.  . fenofibrate 160 MG tablet Take 160 mg by mouth daily.  . ferrous sulfate 325 (65 FE) MG tablet Take 325 mg by mouth 3 (three) times daily with meals.  Marland Kitchen glimepiride (AMARYL) 2 MG tablet Take 2 mg by mouth 2 (two) times daily.   Marland Kitchen levETIRAcetam (KEPPRA) 500 MG tablet Take 500 mg by mouth 2 (two) times daily.  Marland Kitchen levothyroxine (SYNTHROID, LEVOTHROID) 25 MCG tablet Take 25 mcg by mouth daily before breakfast.  . meclizine (ANTIVERT) 25 MG tablet Take 25 mg by mouth 3 (three) times daily as needed for dizziness.  . metFORMIN (GLUCOPHAGE) 1000 MG tablet Take 1,000 mg by mouth 2 (two) times daily with a meal.  . midodrine (PROAMATINE) 2.5 MG tablet Take 2.5 mg by mouth 3 (three) times daily with meals.  . Omega-3 Fatty Acids (FISH OIL) 1000 MG CAPS Take 1 capsule by mouth 3 (three) times daily.  Marland Kitchen omeprazole (PRILOSEC) 40 MG capsule Take 40 mg by mouth daily.    . ondansetron (ZOFRAN) 4 MG tablet Take 4 mg by mouth every 4 (four) hours as needed for nausea or vomiting.  . sitaGLIPtin (JANUVIA) 100 MG tablet Take 100 mg by mouth daily.  . vitamin B-12 (CYANOCOBALAMIN) 1000 MCG tablet Take 1,000 mcg by mouth daily.  Marland Kitchen warfarin (COUMADIN) 3 MG tablet Take 3 mg by mouth every other day. Alternate with 4 mg tablet (daily after  breakfast) or as directed by Dr. Natasha Bence  . warfarin (COUMADIN) 4 MG tablet Take 4 mg by mouth every other day. Alternate with 3 mg tablet (daily after breakfast) or as directed by Dr. Natasha Bence     Allergies:   Nitroglycerin   Social History   Tobacco Use  . Smoking status: Never Smoker  . Smokeless tobacco: Never Used  . Tobacco comment:   Smoking Status: never  Substance Use Topics  . Alcohol use: No    Alcohol/week: 0.0 standard drinks  . Drug use: No     Family Hx: The patient's family history includes Diabetes in an other family member; Hypertension in an other family member.  ROS:   Please see the history  of present illness. All other systems reviewed and are negative.   Prior CV studies:   The following studies were reviewed today:  Echocardiogram 12/17/2013: Study Conclusions  - Left ventricle: The cavity size was normal. Wall thickness was increased in a pattern of mild LVH. Systolic function was normal. The estimated ejection fraction was in the range of 60% to 65%. There is hypokinesis of the basalinferior myocardium. Doppler parameters are consistent with abnormal left ventricular relaxation (grade 1 diastolic dysfunction). - Aortic valve: Mildly calcified annulus. Trileaflet; mildly thickened leaflets. Trivial regurgitation. - Mitral valve: Calcified annulus. Mildly thickened leaflets . Mild regurgitation directed eccentrically. - Left atrium: The atrium was mildly dilated. The appendage was mildly to moderately dilated. - Right ventricle: Pacer wire or catheter noted in right ventricle. - Tricuspid valve: Mild regurgitation. Peak RV-RA gradient: 54m Hg (S). - Pulmonary arteries: Systolic pressure could not be accurately estimated. Likely mildly increased based on RV-RA gradient, assuming normal CVP. - Inferior vena cava: Not visualized. Unable toestimate CVP. - Pericardium, extracardiac: A prominent pericardial fat pad was  present.  Impressions:  - Unable to compare directly with prior study March 2014. Mild LVH with LVEF 60-65%, basal inferior hypokinesis, grade 1 diastolic dysfunction. Mild left atrial enlargement. MAC with mild mitral regurgitation. Device wire noted in the right heart. Mild tricuspid regurgitation. RV-RA gradient 38 mmHg, likely mildly increased PASP assuming normal CVP. This is significantly improved compared with prior report.  Labs/Other Tests and Data Reviewed:    EKG:  An ECG dated 09/04/2018 was personally reviewed today and demonstrated:  Sinus rhythm.  Recent Labs:  No interval lab work for review.  Wt Readings from Last 3 Encounters:  11/27/19 271 lb (122.9 kg)  10/23/19 267 lb (121.1 kg)  09/10/18 268 lb (121.6 kg)     Objective:    Vital Signs:  Ht _0  (1.854 m)   Wt 271 lb (122.9 kg)   BMI 35.75 kg/m    No vital signs for today's encounter.  Patient's wife does state that his oxygen saturation recently was 97. Patient did not verbalize, his wife relayed recent symptoms and situation.  ASSESSMENT & PLAN:    1.  CAD with history of DES to the posterior lateral in 2006.  This is being followed conservatively.  He is on aspirin and statin.  Very limited functional capacity based on discussion with wife.  He does have intermittent angina symptoms at times but cannot tolerate nitroglycerin due to hypotension.  2.  Declining functional capacity with frequent falls.  Patient's wife is in the process of checking on other avenues for assistance, possibly nursing facility.  They plan to talk with Dr. HAmanda Peathis week.  3.  History of recurrent pulmonary emboli, patient remains on Coumadin with management by Dr. HSherrie Sport  Patient's wife states that he has not had any spontaneous bleeding episodes.  4.  Sick sinus syndrome with Medtronic pacemaker in place and history of SVT/atrial tachycardia.  This is being followed by Dr. ARayann Heman  Last device  interrogation reviewed.  COVID-19 Education: The signs and symptoms of COVID-19 were discussed with the patient and how to seek care for testing (follow up with PCP or arrange E-visit).  The importance of social distancing was discussed today.  Time:   Today, I have spent 12 minutes with the patient with telehealth technology discussing the above problems.     Medication Adjustments/Labs and Tests Ordered: Current medicines are reviewed at length with the patient today.  Concerns  regarding medicines are outlined above.   Tests Ordered: No orders of the defined types were placed in this encounter.   Medication Changes: No orders of the defined types were placed in this encounter.   Follow Up:  In Person 1 year in the [place] office.  Signed, Rozann Lesches, MD  11/27/2019 10:43 AM    [place]

## 2019-11-27 NOTE — Patient Instructions (Signed)
Medication Instructions:   Your physician recommends that you continue on your current medications as directed. Please refer to the Current Medication list given to you today.  Labwork:  NONE  Testing/Procedures:  NONE  Follow-Up:  Your physician recommends that you schedule a follow-up appointment in: 1 year ([place] office). You will receive a reminder letter in the mail in about 10 months reminding you to call and schedule your appointment. If you don't receive this letter, please contact our office.  Any Other Special Instructions Will Be Listed Below (If Applicable).  If you need a refill on your cardiac medications before your next appointment, please call your pharmacy. 

## 2019-12-28 DIAGNOSIS — Z6836 Body mass index (BMI) 36.0-36.9, adult: Secondary | ICD-10-CM | POA: Diagnosis not present

## 2019-12-28 DIAGNOSIS — G40919 Epilepsy, unspecified, intractable, without status epilepticus: Secondary | ICD-10-CM | POA: Diagnosis not present

## 2019-12-28 DIAGNOSIS — M48062 Spinal stenosis, lumbar region with neurogenic claudication: Secondary | ICD-10-CM | POA: Diagnosis not present

## 2019-12-28 DIAGNOSIS — K219 Gastro-esophageal reflux disease without esophagitis: Secondary | ICD-10-CM | POA: Diagnosis not present

## 2019-12-28 DIAGNOSIS — I5032 Chronic diastolic (congestive) heart failure: Secondary | ICD-10-CM | POA: Diagnosis not present

## 2019-12-28 DIAGNOSIS — N182 Chronic kidney disease, stage 2 (mild): Secondary | ICD-10-CM | POA: Diagnosis not present

## 2019-12-28 DIAGNOSIS — E038 Other specified hypothyroidism: Secondary | ICD-10-CM | POA: Diagnosis not present

## 2019-12-28 DIAGNOSIS — E1121 Type 2 diabetes mellitus with diabetic nephropathy: Secondary | ICD-10-CM | POA: Diagnosis not present

## 2019-12-28 DIAGNOSIS — E7849 Other hyperlipidemia: Secondary | ICD-10-CM | POA: Diagnosis not present

## 2020-02-22 ENCOUNTER — Ambulatory Visit (INDEPENDENT_AMBULATORY_CARE_PROVIDER_SITE_OTHER): Payer: Medicare HMO | Admitting: *Deleted

## 2020-02-22 DIAGNOSIS — I495 Sick sinus syndrome: Secondary | ICD-10-CM | POA: Diagnosis not present

## 2020-02-22 LAB — CUP PACEART REMOTE DEVICE CHECK
Battery Impedance: 255 Ohm
Battery Remaining Longevity: 121 mo
Battery Voltage: 2.8 V
Brady Statistic AP VP Percent: 3 %
Brady Statistic AP VS Percent: 17 %
Brady Statistic AS VP Percent: 0 %
Brady Statistic AS VS Percent: 80 %
Date Time Interrogation Session: 20210405082419
Implantable Lead Implant Date: 20060828
Implantable Lead Implant Date: 20060828
Implantable Lead Location: 753859
Implantable Lead Location: 753860
Implantable Lead Model: 5076
Implantable Lead Model: 5076
Implantable Pulse Generator Implant Date: 20160906
Lead Channel Impedance Value: 381 Ohm
Lead Channel Impedance Value: 501 Ohm
Lead Channel Pacing Threshold Amplitude: 0.875 V
Lead Channel Pacing Threshold Amplitude: 1.25 V
Lead Channel Pacing Threshold Pulse Width: 0.4 ms
Lead Channel Pacing Threshold Pulse Width: 0.4 ms
Lead Channel Setting Pacing Amplitude: 2 V
Lead Channel Setting Pacing Amplitude: 2.5 V
Lead Channel Setting Pacing Pulse Width: 0.64 ms
Lead Channel Setting Sensing Sensitivity: 2.8 mV

## 2020-02-23 NOTE — Progress Notes (Signed)
PPM Remote  

## 2020-04-20 ENCOUNTER — Other Ambulatory Visit: Payer: Self-pay

## 2020-04-20 ENCOUNTER — Encounter (HOSPITAL_COMMUNITY): Payer: Self-pay

## 2020-04-20 ENCOUNTER — Emergency Department (HOSPITAL_COMMUNITY): Payer: Medicare HMO

## 2020-04-20 ENCOUNTER — Observation Stay (HOSPITAL_COMMUNITY)
Admission: EM | Admit: 2020-04-20 | Discharge: 2020-04-22 | Disposition: A | Discharge status: Home / Self Care | Payer: Medicare HMO | Attending: Cardiovascular Disease | Admitting: Cardiovascular Disease

## 2020-04-20 DIAGNOSIS — R0602 Shortness of breath: Secondary | ICD-10-CM | POA: Diagnosis not present

## 2020-04-20 DIAGNOSIS — E782 Mixed hyperlipidemia: Secondary | ICD-10-CM | POA: Insufficient documentation

## 2020-04-20 DIAGNOSIS — R079 Chest pain, unspecified: Secondary | ICD-10-CM | POA: Diagnosis not present

## 2020-04-20 DIAGNOSIS — I10 Essential (primary) hypertension: Secondary | ICD-10-CM | POA: Insufficient documentation

## 2020-04-20 DIAGNOSIS — I272 Pulmonary hypertension, unspecified: Secondary | ICD-10-CM | POA: Insufficient documentation

## 2020-04-20 DIAGNOSIS — I700 Atherosclerosis of aorta: Secondary | ICD-10-CM | POA: Insufficient documentation

## 2020-04-20 DIAGNOSIS — Z8673 Personal history of transient ischemic attack (TIA), and cerebral infarction without residual deficits: Secondary | ICD-10-CM | POA: Diagnosis not present

## 2020-04-20 DIAGNOSIS — Z8249 Family history of ischemic heart disease and other diseases of the circulatory system: Secondary | ICD-10-CM | POA: Diagnosis not present

## 2020-04-20 DIAGNOSIS — E785 Hyperlipidemia, unspecified: Secondary | ICD-10-CM | POA: Diagnosis present

## 2020-04-20 DIAGNOSIS — E039 Hypothyroidism, unspecified: Secondary | ICD-10-CM | POA: Insufficient documentation

## 2020-04-20 DIAGNOSIS — M109 Gout, unspecified: Secondary | ICD-10-CM | POA: Diagnosis not present

## 2020-04-20 DIAGNOSIS — Z833 Family history of diabetes mellitus: Secondary | ICD-10-CM | POA: Insufficient documentation

## 2020-04-20 DIAGNOSIS — Z7901 Long term (current) use of anticoagulants: Secondary | ICD-10-CM | POA: Diagnosis not present

## 2020-04-20 DIAGNOSIS — F329 Major depressive disorder, single episode, unspecified: Secondary | ICD-10-CM | POA: Insufficient documentation

## 2020-04-20 DIAGNOSIS — G4733 Obstructive sleep apnea (adult) (pediatric): Secondary | ICD-10-CM | POA: Diagnosis present

## 2020-04-20 DIAGNOSIS — Z79890 Hormone replacement therapy (postmenopausal): Secondary | ICD-10-CM | POA: Insufficient documentation

## 2020-04-20 DIAGNOSIS — Z20822 Contact with and (suspected) exposure to covid-19: Secondary | ICD-10-CM | POA: Insufficient documentation

## 2020-04-20 DIAGNOSIS — E119 Type 2 diabetes mellitus without complications: Secondary | ICD-10-CM | POA: Diagnosis not present

## 2020-04-20 DIAGNOSIS — Z86711 Personal history of pulmonary embolism: Secondary | ICD-10-CM | POA: Insufficient documentation

## 2020-04-20 DIAGNOSIS — Z888 Allergy status to other drugs, medicaments and biological substances status: Secondary | ICD-10-CM | POA: Insufficient documentation

## 2020-04-20 DIAGNOSIS — I2510 Atherosclerotic heart disease of native coronary artery without angina pectoris: Secondary | ICD-10-CM | POA: Diagnosis present

## 2020-04-20 DIAGNOSIS — I200 Unstable angina: Secondary | ICD-10-CM | POA: Diagnosis present

## 2020-04-20 DIAGNOSIS — Z7982 Long term (current) use of aspirin: Secondary | ICD-10-CM | POA: Diagnosis not present

## 2020-04-20 DIAGNOSIS — Z7984 Long term (current) use of oral hypoglycemic drugs: Secondary | ICD-10-CM | POA: Insufficient documentation

## 2020-04-20 DIAGNOSIS — Z79899 Other long term (current) drug therapy: Secondary | ICD-10-CM | POA: Diagnosis not present

## 2020-04-20 DIAGNOSIS — I25110 Atherosclerotic heart disease of native coronary artery with unstable angina pectoris: Principal | ICD-10-CM | POA: Insufficient documentation

## 2020-04-20 DIAGNOSIS — Z950 Presence of cardiac pacemaker: Secondary | ICD-10-CM | POA: Diagnosis present

## 2020-04-20 DIAGNOSIS — R0689 Other abnormalities of breathing: Secondary | ICD-10-CM | POA: Diagnosis not present

## 2020-04-20 DIAGNOSIS — R0789 Other chest pain: Secondary | ICD-10-CM | POA: Diagnosis not present

## 2020-04-20 DIAGNOSIS — R0902 Hypoxemia: Secondary | ICD-10-CM | POA: Diagnosis not present

## 2020-04-20 DIAGNOSIS — I4510 Unspecified right bundle-branch block: Secondary | ICD-10-CM | POA: Diagnosis not present

## 2020-04-20 LAB — BASIC METABOLIC PANEL
Anion gap: 13 (ref 5–15)
BUN: 10 mg/dL (ref 8–23)
CO2: 27 mmol/L (ref 22–32)
Calcium: 8.6 mg/dL — ABNORMAL LOW (ref 8.9–10.3)
Chloride: 96 mmol/L — ABNORMAL LOW (ref 98–111)
Creatinine, Ser: 1.28 mg/dL — ABNORMAL HIGH (ref 0.61–1.24)
GFR calc Af Amer: >60 mL/min (ref >60)
GFR calc non Af Amer: 52 mL/min — ABNORMAL LOW (ref >60)
Glucose, Bld: 205 mg/dL — ABNORMAL HIGH (ref 70–99)
Potassium: 3.6 mmol/L (ref 3.5–5.1)
Sodium: 136 mmol/L (ref 135–145)

## 2020-04-20 LAB — CBC WITH DIFFERENTIAL/PLATELET
Abs Immature Granulocytes: 0.04 10*3/uL (ref 0.00–0.07)
Basophils Absolute: 0 10*3/uL (ref 0.0–0.1)
Basophils Relative: 0 %
Eosinophils Absolute: 0.1 10*3/uL (ref 0.0–0.5)
Eosinophils Relative: 2 %
HCT: 46 % (ref 39.0–52.0)
Hemoglobin: 15.3 g/dL (ref 13.0–17.0)
Immature Granulocytes: 0 %
Lymphocytes Relative: 14 %
Lymphs Abs: 1.4 10*3/uL (ref 0.7–4.0)
MCH: 32 pg (ref 26.0–34.0)
MCHC: 33.3 g/dL (ref 30.0–36.0)
MCV: 96.2 fL (ref 80.0–100.0)
Monocytes Absolute: 0.7 10*3/uL (ref 0.1–1.0)
Monocytes Relative: 7 %
Neutro Abs: 7.4 10*3/uL (ref 1.7–7.7)
Neutrophils Relative %: 77 %
Platelets: 174 10*3/uL (ref 150–400)
RBC: 4.78 MIL/uL (ref 4.22–5.81)
RDW: 13.2 % (ref 11.5–15.5)
WBC: 9.7 10*3/uL (ref 4.0–10.5)
nRBC: 0 % (ref 0.0–0.2)

## 2020-04-20 LAB — TROPONIN I (HIGH SENSITIVITY): Troponin I (High Sensitivity): 9 ng/L (ref <18)

## 2020-04-20 LAB — PROTIME-INR
INR: 1.1 (ref 0.8–1.2)
Prothrombin Time: 13.4 seconds (ref 11.4–15.2)

## 2020-04-20 LAB — APTT: aPTT: 29 seconds (ref 24–36)

## 2020-04-20 NOTE — ED Provider Notes (Signed)
[facility] EMERGENCY DEPARTMENT Provider Note   CSN: [id number] Arrival date & time: 04/20/20  2152     History Chief Complaint  Patient presents with  . Chest Pain    [name] is a 85 y.o. male.  Patient is an 85 year old male with a history of recurrent PE on Coumadin, CAD status post stent years ago, pulmonary hypertension, sick sinus syndrome status post pacemaker, hypertension, hyperlipidemia and diabetes presenting today with complaints of chest pain.  Patient reports for the last 2 weeks he started to have intermittent episodes of chest pain.  He describes his mom as tightness in the left side of his chest that he sometimes feels in his arm.  They can last anywhere from 30 minutes to an hour and a half and during the events he feels short of breath, diaphoretic and nauseated.  He reports the episodes are starting to last longer and come more frequently.  They are worsened by exertion but he can get them at rest as well.  Patient sees Dr. Domenic Polite with cardiology but has not seen him in a while.  Because he was having more pain tonight he called 911.  Patient reports he is currently pain-free.  He does not take nitroglycerin because he reports it causes his blood pressure to drop too low but does take Coumadin daily.  He is also on an 81 mg aspirin.  He denies any recent lower extremity swelling, abdominal pain, nausea, vomiting.  He has had no cough, congestion or fever.  The history is provided by the patient.  Chest Pain Pain location:  L chest Pain quality: dull and tightness   Pain radiates to:  L arm Pain severity:  Severe Onset quality:  Gradual Duration:  2 weeks Timing:  Intermittent Progression:  Worsening Chronicity:  Recurrent Context: at rest   Relieved by:  None tried Worsened by:  Exertion Ineffective treatments:  None tried Associated symptoms: diaphoresis, dizziness, fatigue, nausea and shortness of breath   Associated symptoms: no  abdominal pain, no back pain, no cough, no fever, no heartburn, no lower extremity edema, no palpitations and no vomiting        Past Medical History:  Diagnosis Date  . Carotid sinus syndrome    Status post pacemaker  . Coronary atherosclerosis of native coronary artery    DES to PL 2006, mild to moderate residual disease 2010   . Essential hypertension   . Mixed hyperlipidemia   . Obstructive sleep apnea    CPAP  . Pacemaker 1986   Medtronic  . Pulmonary emboli [facility])    Diagnosed 2012 and 2014, IVC filter March 2014, recurrent March 2119 - thromboembolic therapy at [facility] and placed back on anticoagulation  . Pulmonary hypertension (HCC)    Severe, RVSP 85-90 mmHg March 2014   . Stroke ([place]) 11/2012  . Subdural hematoma [facility])    January 2014, occurred on Coumadin  . Type 2 diabetes mellitus [facility])     Patient Active Problem List   Diagnosis Date Noted  . Morbid obesity ([place]) 11/25/2015  . Sick sinus syndrome ([place]) 07/22/2015  . Hx of subdural hematoma 11/27/2013  . Pacemaker-Medtronic 08/30/2011  . Recurrent pulmonary emboli ([place]) 07/27/2011  . Long term (current) use of anticoagulants 07/27/2011  . Coronary atherosclerosis of native coronary artery 01/24/2010  . SLEEP APNEA, OBSTRUCTIVE 07/05/2009  . HYPERLIPIDEMIA-MIXED 07/04/2009  . Carotid sinus syndrome 07/04/2009  . Essential hypertension, benign 07/04/2009  . ATHEROSCLEROSIS OF AORTA 06/15/2009  Past Surgical History:  Procedure Laterality Date  . Arm surgery Right   . CATARACT EXTRACTION W/PHACO Right 02/18/2014   Procedure: CATARACT EXTRACTION PHACO AND INTRAOCULAR LENS PLACEMENT (IOC);  Surgeon: Gemma Payor, MD;  Location: [facility] ORS;  Service: Ophthalmology;  Laterality: Right;  CDE: 11.83  . CATARACT EXTRACTION W/PHACO Left 03/25/2014   Procedure: CATARACT EXTRACTION PHACO AND INTRAOCULAR LENS PLACEMENT (IOC);  Surgeon: Gemma Payor, MD;  Location: [facility] ORS;  Service: Ophthalmology;  Laterality: Left;  CDE 15.93  .  CHOLECYSTECTOMY  2011  . EP IMPLANTABLE DEVICE N/A 07/26/2015   MDT Adapta L gen change by Dr Johney Frame  . FILTERING PROCEDURE  11/2012   placed due to blood clots from the lungs  . PACEMAKER IMPLANTED   07/16/2005   Medtronic Kappa 900 DR       Family History  Problem Relation Age of Onset  . Diabetes Other   . Hypertension Other     Social History   Tobacco Use  . Smoking status: Never Smoker  . Smokeless tobacco: Never Used  . Tobacco comment:   Smoking Status: never  Substance Use Topics  . Alcohol use: No    Alcohol/week: 0.0 standard drinks  . Drug use: No    Home Medications Prior to Admission medications   Medication Sig Start Date End Date Taking? Authorizing Provider  allopurinol (ZYLOPRIM) 300 MG tablet Take 300 mg by mouth daily after breakfast.     [provider]  aspirin EC 81 MG tablet Take 81 mg by mouth daily after breakfast.    [provider]  atorvastatin (LIPITOR) 40 MG tablet Take 40 mg by mouth daily after breakfast.     [provider]  Cholecalciferol 25 MCG (1000 UT) capsule Take 1,000 Units by mouth daily.    [provider]  citalopram (CELEXA) 20 MG tablet Take 20 mg by mouth daily. 07/15/15   [provider]  colchicine 0.6 MG tablet Take 0.6 mg by mouth daily as needed (gout attacks).     [provider]  ergocalciferol (VITAMIN D2) 1.25 MG (50000 UT) capsule Take 50,000 Units by mouth once a week.    [provider]  fenofibrate 160 MG tablet Take 160 mg by mouth daily.    [provider]  ferrous sulfate 325 (65 FE) MG tablet Take 325 mg by mouth 3 (three) times daily with meals.    [provider]  glimepiride (AMARYL) 2 MG tablet Take 2 mg by mouth 2 (two) times daily.     [provider]  levETIRAcetam (KEPPRA) 500 MG tablet Take 500 mg by mouth 2 (two) times daily.    [provider]  levothyroxine (SYNTHROID, LEVOTHROID) 25 MCG tablet Take 25  mcg by mouth daily before breakfast.    [provider]  meclizine (ANTIVERT) 25 MG tablet Take 25 mg by mouth 3 (three) times daily as needed for dizziness.    [provider]  metFORMIN (GLUCOPHAGE) 1000 MG tablet Take 1,000 mg by mouth 2 (two) times daily with a meal.    [provider]  midodrine (PROAMATINE) 2.5 MG tablet Take 2.5 mg by mouth 3 (three) times daily with meals.    [provider]  Omega-3 Fatty Acids (FISH OIL) 1000 MG CAPS Take 1 capsule by mouth 3 (three) times daily.    [provider]  omeprazole (PRILOSEC) 40 MG capsule Take 40 mg by mouth daily.      [provider]  ondansetron (ZOFRAN) 4 MG tablet Take 4 mg by mouth every 4 (four) hours as needed for nausea or vomiting.    [provider]  sitaGLIPtin (JANUVIA) 100 MG tablet Take 100 mg by mouth daily.    [provider]  vitamin B-12 (CYANOCOBALAMIN) 1000 MCG tablet Take 1,000 mcg by mouth daily.    [provider]  warfarin (COUMADIN) 3 MG tablet Take 3 mg by mouth every other day. Alternate with 4 mg tablet (daily after breakfast) or as directed by Dr. Jeronimo Greaves    [provider]  warfarin (COUMADIN) 4 MG tablet Take 4 mg by mouth every other day. Alternate with 3 mg tablet (daily after breakfast) or as directed by Dr. Jeronimo Greaves    [provider]    Allergies    Nitroglycerin  Review of Systems   Review of Systems  Constitutional: Positive for diaphoresis and fatigue. Negative for fever.  Respiratory: Positive for shortness of breath. Negative for cough.   Cardiovascular: Positive for chest pain. Negative for palpitations.  Gastrointestinal: Positive for nausea. Negative for abdominal pain, heartburn and vomiting.  Musculoskeletal: Negative for back pain.  Neurological: Positive for dizziness.  All other systems reviewed and are negative.   Physical Exam Updated Vital Signs BP 122/81   Pulse 82   Temp 98.3 F  (36.8 C) (Oral)   Resp (!) 21   SpO2 93%   Physical Exam Vitals and nursing note reviewed.  Constitutional:      General: He is not in acute distress.    Appearance: He is well-developed. He is obese.  HENT:     Head: Normocephalic and atraumatic.  Eyes:     Conjunctiva/sclera: Conjunctivae normal.     Pupils: Pupils are equal, round, and reactive to light.  Cardiovascular:     Rate and Rhythm: Normal rate and regular rhythm.     Heart sounds: No murmur.  Pulmonary:     Effort: Pulmonary effort is normal. No respiratory distress.     Breath sounds: Normal breath sounds. No wheezing or rales.     Comments: Pacemaker present in the left upper chest Abdominal:     General: There is no distension.     Palpations: Abdomen is soft.     Tenderness: There is no abdominal tenderness. There is no guarding or rebound.  Musculoskeletal:        General: No tenderness. Normal range of motion.     Cervical back: Normal range of motion and neck supple.     Right lower leg: No edema.     Left lower leg: No edema.     Comments: Right foot is slightly cyanotic and cool compared to the left foot but 1+ DP pulse compared to 2+ DP pulse on the left foot  Skin:    General: Skin is warm and dry.     Capillary Refill: Capillary refill takes 2 to 3 seconds.     Findings: No erythema or rash.  Neurological:     General: No focal deficit present.     Mental Status: He is alert and oriented to person, place, and time.  Psychiatric:        Mood and Affect: Mood normal.        Behavior: Behavior normal.     ED Results / Procedures / Treatments   Labs (all labs ordered are listed, but only abnormal results are displayed) Labs Reviewed  SARS CORONAVIRUS 2 BY RT PCR (HOSPITAL ORDER, PERFORMED IN [place]  [facility])  CBC WITH DIFFERENTIAL/PLATELET  APTT  PROTIME-INR  BASIC METABOLIC PANEL  TROPONIN I (HIGH SENSITIVITY)    EKG EKG Interpretation  Date/Time:  Wednesday April 20 2020  22:03:20 EDT Ventricular Rate:  80 PR Interval:    QRS Duration: 148 QT Interval:  400 QTC Calculation: 462 R Axis:   -53 Text Interpretation: Sinus rhythm Atrial premature complexes new RBBB and LAFB Confirmed by Gwyneth Sprout ([id number]) on 04/20/2020 10:28:10 PM   Radiology DG Chest Port 1 View  Result Date: 04/20/2020 CLINICAL DATA:  Chest pain and shortness of breath EXAM: PORTABLE CHEST 1 VIEW COMPARISON:  08/22/2018 FINDINGS: Cardiac shadow is enlarged. Pacing device is again seen. Lungs are well aerated bilaterally. No focal infiltrate is seen. No bony abnormality is noted. IMPRESSION: No acute abnormality noted. Electronically Signed   By: Alcide Clever M.D.   On: 04/20/2020 22:41    Procedures Procedures (including critical care time)  Medications Ordered in ED Medications - No data to display  ED Course  I have reviewed the triage vital signs and the nursing notes.  Pertinent labs & imaging results that were available during my care of the patient were reviewed by me and considered in my medical decision making (see chart for details).    MDM Rules/Calculators/A&P                      Elderly male with multiple medical problems presenting today with intermittent chest pain over the last 2 weeks concerning for unstable angina.  Patient does have a significant history for clot and is on Coumadin daily but is not having constant chest pain or shortness of breath.  Patient reports it comes intermittently and will last for 30 minutes to an hour and a half but is becoming more frequent.  Patient has a right bundle branch block on EKG today which is new from 2016.  He denies any infectious symptoms suggestive of pneumonia, Covid or URI.  Patient symptoms are not classic for dissection and he has no EKG findings concerning for pericarditis.  Patient has no evidence of CHF on exam.  Also no wheezing and no prior history of COPD.  Patient's vital signs are stable at this time.  He again  is chest pain-free at this time.  Labs and imaging are pending.  11:39 PM CXR wnl.  Final Clinical Impression(s) / ED Diagnoses Final diagnoses:  None    Rx / DC Orders ED Discharge Orders    None       Gwyneth Sprout, MD 04/20/20 2339

## 2020-04-20 NOTE — ED Provider Notes (Signed)
11:46 PM Assumed care from Dr. Anitra Lauth, please see their note for full history, physical and decision making until this point. In brief this is a 85 y.o. year old male who presented to the ED tonight with Chest Pain     H/o PE's, ACS, SSS, pacemaker. Also with coumadin. Here with chest tightness on left side 30-90 minutes. Worse with exertion along with nausea, diaphoresis.  No NTG because he is on midodrine.  No current pain, better since calling EMS.  RBBB new since last (awhile ago). [facility] cardiologist.   Admit after second trop.  Labs, studies and imaging reviewed by myself and considered in medical decision making if ordered. Imaging interpreted by radiology.  Labs Reviewed  BASIC METABOLIC PANEL - Abnormal; Notable for the following components:      Result Value   Chloride 96 (*)    Glucose, Bld 205 (*)    Creatinine, Ser 1.28 (*)    Calcium 8.6 (*)    GFR calc non Af Amer 52 (*)    All other components within normal limits  SARS CORONAVIRUS 2 BY RT PCR (HOSPITAL ORDER, PERFORMED IN [place] [facility])  CBC WITH DIFFERENTIAL/PLATELET  APTT  PROTIME-INR  TROPONIN I (HIGH SENSITIVITY)    DG Chest Port 1 View  Final Result      No follow-ups on file.    Beatriz Settles, Barbara Cower, MD 04/22/20 [phone]

## 2020-04-20 NOTE — ED Triage Notes (Signed)
Pt BIB [facility] from home c/o CP and SHOB. Pt states he has been having CP for about 2 weeks on and off and when the pain comes he gets diaphoretic and SHOB. Pt states the pain is a 8/10. Pt also states he has a pacemaker.

## 2020-04-21 ENCOUNTER — Observation Stay (HOSPITAL_COMMUNITY): Payer: Medicare HMO

## 2020-04-21 ENCOUNTER — Encounter (HOSPITAL_COMMUNITY)
Admission: EM | Disposition: A | Discharge status: Home / Self Care | Payer: Self-pay | Source: Home / Self Care | Attending: Emergency Medicine

## 2020-04-21 ENCOUNTER — Observation Stay (HOSPITAL_BASED_OUTPATIENT_CLINIC_OR_DEPARTMENT_OTHER): Payer: Medicare HMO

## 2020-04-21 DIAGNOSIS — I200 Unstable angina: Secondary | ICD-10-CM | POA: Diagnosis not present

## 2020-04-21 DIAGNOSIS — R079 Chest pain, unspecified: Secondary | ICD-10-CM | POA: Diagnosis not present

## 2020-04-21 DIAGNOSIS — I25110 Atherosclerotic heart disease of native coronary artery with unstable angina pectoris: Secondary | ICD-10-CM

## 2020-04-21 DIAGNOSIS — R0602 Shortness of breath: Secondary | ICD-10-CM | POA: Diagnosis not present

## 2020-04-21 HISTORY — PX: LEFT HEART CATH AND CORONARY ANGIOGRAPHY: CATH118249

## 2020-04-21 LAB — T4, FREE: Free T4: 0.74 ng/dL (ref 0.61–1.12)

## 2020-04-21 LAB — GLUCOSE, CAPILLARY
Glucose-Capillary: 195 mg/dL — ABNORMAL HIGH (ref 70–99)
Glucose-Capillary: 214 mg/dL — ABNORMAL HIGH (ref 70–99)
Glucose-Capillary: 202 mg/dL — ABNORMAL HIGH (ref 70–99)
Glucose-Capillary: 168 mg/dL — ABNORMAL HIGH (ref 70–99)
Glucose-Capillary: 140 mg/dL — ABNORMAL HIGH (ref 70–99)

## 2020-04-21 LAB — TROPONIN I (HIGH SENSITIVITY): Troponin I (High Sensitivity): 8 ng/L (ref <18)

## 2020-04-21 LAB — LIPID PANEL
Cholesterol: 117 mg/dL (ref 0–200)
HDL: 39 mg/dL — ABNORMAL LOW (ref >40)
LDL Cholesterol: 58 mg/dL (ref 0–99)
Total CHOL/HDL Ratio: 3 RATIO
Triglycerides: 101 mg/dL (ref <150)
VLDL: 20 mg/dL (ref 0–40)

## 2020-04-21 LAB — ECHOCARDIOGRAM COMPLETE
Height: 73 in
Weight: 4144 oz

## 2020-04-21 LAB — HEMOGLOBIN A1C
Hgb A1c MFr Bld: 8.1 % — ABNORMAL HIGH (ref 4.8–5.6)
Mean Plasma Glucose: 185.77 mg/dL

## 2020-04-21 LAB — D-DIMER, QUANTITATIVE: D-Dimer, Quant: 1.33 ug/mL-FEU — ABNORMAL HIGH (ref 0.00–0.50)

## 2020-04-21 LAB — SARS CORONAVIRUS 2 BY RT PCR (HOSPITAL ORDER, PERFORMED IN ~~LOC~~ HOSPITAL LAB): SARS Coronavirus 2: NEGATIVE

## 2020-04-21 LAB — TSH: TSH: 11.428 u[IU]/mL — ABNORMAL HIGH (ref 0.350–4.500)

## 2020-04-21 SURGERY — LEFT HEART CATH AND CORONARY ANGIOGRAPHY
Anesthesia: LOCAL

## 2020-04-21 MED ORDER — LABETALOL HCL 5 MG/ML IV SOLN: 10.0000 mg | INTRAVENOUS | Status: AC | PRN

## 2020-04-21 MED ORDER — SODIUM CHLORIDE 0.9% FLUSH: 3.0000 mL | INTRAVENOUS | Status: DC | PRN

## 2020-04-21 MED ORDER — ATORVASTATIN CALCIUM 40 MG PO TABS
40.0000 mg | ORAL_TABLET | Freq: Every day | ORAL | Status: DC
  Administered 2020-04-21 – 2020-04-22 (×2): 40 mg via ORAL
  Filled 2020-04-21 (×2): qty 1

## 2020-04-21 MED ORDER — HEPARIN (PORCINE) IN NACL 1000-0.9 UT/500ML-% IV SOLN
INTRAVENOUS | Status: DC | PRN
  Administered 2020-04-21: 500 mL

## 2020-04-21 MED ORDER — ASPIRIN 81 MG PO CHEW: 81.0000 mg | CHEWABLE_TABLET | ORAL | Status: DC

## 2020-04-21 MED ORDER — HEPARIN BOLUS VIA INFUSION
3000.0000 [IU] | Freq: Once | INTRAVENOUS | Status: AC
  Administered 2020-04-21: 3000 [IU] via INTRAVENOUS
  Filled 2020-04-21: qty 3000

## 2020-04-21 MED ORDER — GLIMEPIRIDE 1 MG PO TABS
2.0000 mg | ORAL_TABLET | Freq: Two times a day (BID) | ORAL | Status: DC
  Administered 2020-04-21 – 2020-04-22 (×3): 2 mg via ORAL
  Filled 2020-04-21 (×2): qty 2
  Filled 2020-04-21 (×2): qty 1

## 2020-04-21 MED ORDER — HEPARIN (PORCINE) IN NACL 1000-0.9 UT/500ML-% IV SOLN
INTRAVENOUS | Status: AC
  Filled 2020-04-21: qty 1000

## 2020-04-21 MED ORDER — LEVOTHYROXINE SODIUM 25 MCG PO TABS
25.0000 ug | ORAL_TABLET | Freq: Every day | ORAL | Status: DC
  Administered 2020-04-21 – 2020-04-22 (×2): 25 ug via ORAL
  Filled 2020-04-21 (×2): qty 1

## 2020-04-21 MED ORDER — INSULIN ASPART 100 UNIT/ML ~~LOC~~ SOLN
0.0000 [IU] | Freq: Three times a day (TID) | SUBCUTANEOUS | Status: DC
  Administered 2020-04-21 – 2020-04-22 (×3): 2 [IU] via SUBCUTANEOUS

## 2020-04-21 MED ORDER — ALLOPURINOL 300 MG PO TABS
300.0000 mg | ORAL_TABLET | Freq: Every day | ORAL | Status: DC
  Administered 2020-04-21 – 2020-04-22 (×2): 300 mg via ORAL
  Filled 2020-04-21 (×2): qty 1

## 2020-04-21 MED ORDER — LEVETIRACETAM 500 MG PO TABS
500.0000 mg | ORAL_TABLET | Freq: Two times a day (BID) | ORAL | Status: DC
  Administered 2020-04-21 – 2020-04-22 (×3): 500 mg via ORAL
  Filled 2020-04-21 (×3): qty 1

## 2020-04-21 MED ORDER — PANTOPRAZOLE SODIUM 40 MG PO TBEC
40.0000 mg | DELAYED_RELEASE_TABLET | Freq: Every day | ORAL | Status: DC
  Administered 2020-04-21 – 2020-04-22 (×2): 40 mg via ORAL
  Filled 2020-04-21 (×2): qty 1

## 2020-04-21 MED ORDER — SODIUM CHLORIDE 0.9% FLUSH: 3.0000 mL | Freq: Two times a day (BID) | INTRAVENOUS | Status: DC

## 2020-04-21 MED ORDER — HEPARIN (PORCINE) 25000 UT/250ML-% IV SOLN
1400.0000 [IU]/h | INTRAVENOUS | Status: AC
  Administered 2020-04-21: 1400 [IU]/h via INTRAVENOUS
  Filled 2020-04-21: qty 250

## 2020-04-21 MED ORDER — VITAMIN B-12 1000 MCG PO TABS
1000.0000 ug | ORAL_TABLET | Freq: Every day | ORAL | Status: DC
  Administered 2020-04-21 – 2020-04-22 (×2): 1000 ug via ORAL
  Filled 2020-04-21 (×2): qty 1

## 2020-04-21 MED ORDER — ASPIRIN EC 81 MG PO TBEC
81.0000 mg | DELAYED_RELEASE_TABLET | Freq: Every day | ORAL | Status: DC
  Administered 2020-04-21: 81 mg via ORAL
  Filled 2020-04-21: qty 1

## 2020-04-21 MED ORDER — SODIUM CHLORIDE 0.9 % IV SOLN: 250.0000 mL | INTRAVENOUS | Status: DC | PRN

## 2020-04-21 MED ORDER — ORAL CARE MOUTH RINSE
15.0000 mL | Freq: Two times a day (BID) | OROMUCOSAL | Status: DC
  Administered 2020-04-21 – 2020-04-22 (×2): 15 mL via OROMUCOSAL

## 2020-04-21 MED ORDER — SODIUM CHLORIDE 0.9 % IV SOLN: INTRAVENOUS | Status: DC

## 2020-04-21 MED ORDER — MIDODRINE HCL 5 MG PO TABS
2.5000 mg | ORAL_TABLET | Freq: Three times a day (TID) | ORAL | Status: DC
  Administered 2020-04-21 – 2020-04-22 (×4): 2.5 mg via ORAL
  Filled 2020-04-21 (×4): qty 1

## 2020-04-21 MED ORDER — MORPHINE SULFATE (PF) 2 MG/ML IV SOLN: 2.0000 mg | INTRAVENOUS | Status: DC | PRN

## 2020-04-21 MED ORDER — CITALOPRAM HYDROBROMIDE 20 MG PO TABS
20.0000 mg | ORAL_TABLET | Freq: Every day | ORAL | Status: DC
  Administered 2020-04-21 – 2020-04-22 (×2): 20 mg via ORAL
  Filled 2020-04-21 (×2): qty 1

## 2020-04-21 MED ORDER — HEPARIN (PORCINE) 25000 UT/250ML-% IV SOLN
1400.0000 [IU]/h | INTRAVENOUS | Status: DC
  Administered 2020-04-21: 1400 [IU]/h via INTRAVENOUS
  Filled 2020-04-21: qty 250

## 2020-04-21 MED ORDER — ASPIRIN EC 81 MG PO TBEC
81.0000 mg | DELAYED_RELEASE_TABLET | Freq: Every day | ORAL | Status: DC
  Administered 2020-04-22: 81 mg via ORAL
  Filled 2020-04-21: qty 1

## 2020-04-21 MED ORDER — SODIUM CHLORIDE 0.9 % IV SOLN
INTRAVENOUS | Status: AC | PRN
  Administered 2020-04-21: 75 mL/h via INTRAVENOUS

## 2020-04-21 MED ORDER — VITAMIN D 25 MCG (1000 UNIT) PO TABS
1000.0000 [IU] | ORAL_TABLET | Freq: Every day | ORAL | Status: DC
  Administered 2020-04-21 – 2020-04-22 (×2): 1000 [IU] via ORAL
  Filled 2020-04-21 (×2): qty 1

## 2020-04-21 MED ORDER — ONDANSETRON HCL 4 MG/2ML IJ SOLN: 4.0000 mg | Freq: Four times a day (QID) | INTRAMUSCULAR | Status: DC | PRN

## 2020-04-21 MED ORDER — HYDRALAZINE HCL 20 MG/ML IJ SOLN: 10.0000 mg | INTRAMUSCULAR | Status: AC | PRN

## 2020-04-21 MED ORDER — IOHEXOL 350 MG/ML SOLN
100.0000 mL | Freq: Once | INTRAVENOUS | Status: AC | PRN
  Administered 2020-04-21: 100 mL via INTRAVENOUS

## 2020-04-21 MED ORDER — SODIUM CHLORIDE 0.9 % IV SOLN: INTRAVENOUS | Status: AC

## 2020-04-21 MED ORDER — INSULIN ASPART 100 UNIT/ML ~~LOC~~ SOLN: 0.0000 [IU] | Freq: Every day | SUBCUTANEOUS | Status: DC

## 2020-04-21 MED ORDER — LIDOCAINE HCL (PF) 1 % IJ SOLN
INTRAMUSCULAR | Status: AC
  Filled 2020-04-21: qty 30

## 2020-04-21 MED ORDER — ACETAMINOPHEN 325 MG PO TABS: 650.0000 mg | ORAL_TABLET | ORAL | Status: DC | PRN

## 2020-04-21 MED ORDER — ACETAMINOPHEN 325 MG PO TABS
650.0000 mg | ORAL_TABLET | ORAL | Status: DC | PRN
  Administered 2020-04-22: 650 mg via ORAL
  Filled 2020-04-21: qty 2

## 2020-04-21 MED ORDER — IOHEXOL 350 MG/ML SOLN
INTRAVENOUS | Status: DC | PRN
  Administered 2020-04-21: 50 mL

## 2020-04-21 MED ORDER — FENOFIBRATE 160 MG PO TABS
160.0000 mg | ORAL_TABLET | Freq: Every day | ORAL | Status: DC
  Administered 2020-04-21 – 2020-04-22 (×2): 160 mg via ORAL
  Filled 2020-04-21 (×2): qty 1

## 2020-04-21 MED ORDER — ONDANSETRON HCL 4 MG PO TABS
4.0000 mg | ORAL_TABLET | ORAL | Status: DC | PRN
  Administered 2020-04-21: 4 mg via ORAL
  Filled 2020-04-21: qty 1

## 2020-04-21 MED ORDER — FERROUS SULFATE 325 (65 FE) MG PO TABS
325.0000 mg | ORAL_TABLET | Freq: Three times a day (TID) | ORAL | Status: DC
  Administered 2020-04-21 – 2020-04-22 (×4): 325 mg via ORAL
  Filled 2020-04-21 (×4): qty 1

## 2020-04-21 MED ORDER — SODIUM CHLORIDE 0.9% FLUSH
3.0000 mL | Freq: Two times a day (BID) | INTRAVENOUS | Status: DC
  Administered 2020-04-22: 3 mL via INTRAVENOUS

## 2020-04-21 MED ORDER — LINAGLIPTIN 5 MG PO TABS
5.0000 mg | ORAL_TABLET | Freq: Every day | ORAL | Status: DC
  Administered 2020-04-21 – 2020-04-22 (×2): 5 mg via ORAL
  Filled 2020-04-21 (×2): qty 1

## 2020-04-21 MED ORDER — LIDOCAINE HCL (PF) 1 % IJ SOLN
INTRAMUSCULAR | Status: DC | PRN
  Administered 2020-04-21: 30 mL

## 2020-04-21 SURGICAL SUPPLY — 8 items
CATH INFINITI 5FR MULTPACK ANG (CATHETERS) ×1 IMPLANT
CLOSURE MYNX CONTROL 5F (Vascular Products) ×1 IMPLANT
KIT HEART LEFT (KITS) ×2 IMPLANT
PACK CARDIAC CATHETERIZATION (CUSTOM PROCEDURE TRAY) ×2 IMPLANT
SHEATH PINNACLE 5F 10CM (SHEATH) ×1 IMPLANT
TRANSDUCER W/STOPCOCK (MISCELLANEOUS) ×2 IMPLANT
TUBING CIL FLEX 10 FLL-RA (TUBING) ×2 IMPLANT
WIRE EMERALD 3MM-J .035X150CM (WIRE) ×1 IMPLANT

## 2020-04-21 NOTE — H&P (View-Only) (Signed)
° °Progress Note ° °Patient Name: [name] °Date of Encounter: 04/21/2020 ° °Primary Cardiologist: Samuel McDowell, MD  ° °Subjective  ° °No chest pain and no SOB lying almost flat in bed.  ° °Inpatient Medications  °  °Scheduled Meds: °• allopurinol  300 mg Oral QPC breakfast  °• aspirin EC  81 mg Oral QPC breakfast  °• atorvastatin  40 mg Oral QPC breakfast  °• cholecalciferol  1,000 Units Oral Daily  °• citalopram  20 mg Oral Daily  °• fenofibrate  160 mg Oral Daily  °• ferrous sulfate  325 mg Oral TID WC  °• glimepiride  2 mg Oral BID WC  °• levETIRAcetam  500 mg Oral BID  °• levothyroxine  25 mcg Oral QAC breakfast  °• linagliptin  5 mg Oral Daily  °• midodrine  2.5 mg Oral TID WC  °• pantoprazole  40 mg Oral Daily  °• vitamin B-12  1,000 mcg Oral Daily  ° °Continuous Infusions: °• heparin 1,400 Units/hr (04/21/20 0517)  ° °PRN Meds: °acetaminophen, ondansetron  ° °Vital Signs  °  °Vitals:  ° 04/21/20 0415 04/21/20 0430 04/21/20 0445 04/21/20 0514  °BP: 123/89 122/66 132/82 (!) 145/82  °Pulse: 82 77 79 74  °Resp: (!) 29 (!) 22 (!) 30 20  °Temp:    98.3 °F (36.8 °C)  °TempSrc:    Oral  °SpO2: (!) 88% (!) 89% 90% 96%  ° ° °Intake/Output Summary (Last 24 hours) at 04/21/2020 0806 °Last data filed at 04/21/2020 0517 °Gross per 24 hour  °Intake 3028 ml  °Output --  °Net 3028 ml  ° °Last 3 Weights 11/27/2019 10/23/2019 09/10/2018  °Weight (lbs) 271 lb 267 lb 268 lb  °Weight (kg) 122.925 kg 121.11 kg 121.564 kg  °   ° °Telemetry  °  °SR with PACs, and atrial pacing - Personally Reviewed ° °ECG  °  °No new, RBBB and LAFB - Personally Reviewed ° °Physical Exam  ° °GEN: No acute distress.   °Neck: No JVD °Cardiac: RRR, no murmurs, rubs, or gallops.  °Respiratory: Clear to auscultation bilaterally. °GI: Soft, nontender, non-distended  °MS: No edema; No deformity. °Neuro:  Nonfocal  °Psych: Normal affect  ° °Labs  °  °High Sensitivity Troponin:   °Recent Labs  °Lab 04/20/20 °2258 04/21/20 °0020  °TROPONINIHS 9 8  °    ° °Chemistry °Recent Labs  °Lab 04/20/20 °2258  °NA 136  °K 3.6  °CL 96*  °CO2 27  °GLUCOSE 205*  °BUN 10  °CREATININE 1.28*  °CALCIUM 8.6*  °GFRNONAA 52*  °GFRAA >60  °ANIONGAP 13  °  ° °Hematology °Recent Labs  °Lab 04/20/20 °2258  °WBC 9.7  °RBC 4.78  °HGB 15.3  °HCT 46.0  °MCV 96.2  °MCH 32.0  °MCHC 33.3  °RDW 13.2  °PLT 174  ° ° °BNPNo results for input(s): BNP, PROBNP in the last 168 hours.  ° °DDimer No results for input(s): DDIMER in the last 168 hours.  ° °Radiology  °  °DG Chest Port 1 View ° °Result Date: 04/20/2020 °CLINICAL DATA:  Chest pain and shortness of breath EXAM: PORTABLE CHEST 1 VIEW COMPARISON:  08/22/2018 FINDINGS: Cardiac shadow is enlarged. Pacing device is again seen. Lungs are well aerated bilaterally. No focal infiltrate is seen. No bony abnormality is noted. IMPRESSION: No acute abnormality noted. Electronically Signed   By: Mark  Lukens M.D.   On: 04/20/2020 22:41  ° ° °Cardiac Studies  ° °Cardiac cath 2010 ° Left main   was large vessel, angiographically normal. °  ° LAD was a large vessel coursing to the apex.  It gave off a very large ° first diagonal.  It had diffuse luminal irregularities throughout the ° vessel.  Throughout the proximal portion, it was approximately 30% ° stenotic and in the midsection, there was a 50-60% focal lesion seen ° best on the LAO cranial view.  This was likely an eccentric plaque. ° There was a 30% lesion in the midportion of the first diagonal. °  ° Left circumflex was a very large dominant system.  It was slightly ° ectatic, gave off small-to-moderate-sized ramus with tiny OM-1, a large ° first posterolateral, medium-sized second posterolateral, and a PDA. ° There was a diffuse 20% lesion throughout the proximal AV groove circ ° and then a 30-40% lesion in the mid AV groove circ after the takeoff of ° the first posterolateral.  In the midportion of the ramus, there was a ° 40-50% lesion.  There was a stent in second posterolateral, which was ° widely  patent. °  ° Right coronary, there was a small nondominant vessel with no high-grade ° stenosis. °  ° Left ventriculogram done in the RAO position showed an EF of 65%.  No ° regional wall motion abnormalities. °  ° ASSESSMENT: ° 1. Coronary artery disease with patent left circumflex stent and °     nonobstructive coronary artery disease elsewhere. ° 2. Normal left ventricular function. °  ° PLAN/DISCUSSION:  Based on his catheterization, his chest pain seems ° noncardiac.  If it persist, I would consider a possible GI workup as an ° outpatient.  He does have a moderate stenosis in the mid LAD, though I ° think this is nonobstructive.  However, with persistent chest pain may ° consider a Myoview to further evaluate. °  ° Of note on panning down of his abdominal aorta after the left ° ventriculogram, did not appear to have some plaquing down there and a ° possible small abdominal aortic aneurysm and may consider a possible CT ° scan of the abdomen as an outpatient to further evaluate as necessary by ° Dr. DeGent.  nuc at time was neg. ° °Echo pending.  °  °  °  ° °Patient Profile  °   °84 y.o. male with PMH significant for HLD, prior PE on warfarin, gout, hypothyroidism, CAD, and DM presenting for unstable angina.  ° °Assessment & Plan  °  °1. Unstable angina- Known CAD without any recent evaluation here (last cath 2010 with stent LCX patent and 30-40% stenosis in mid AV groove.  And mid ramus 40-50% stenosis, 30% prox and 50-60% stenosis and 30% diag.    with intermittent chest discomfort with exertion that has progressively worsened and is now occurring at rest.  Pain free currently.   °-troponin hs 8 & 9 °- Heparin, ACS nomogram °- S/p aspirin 324mg  °- Continue aspirin 81mg daily °- Echo ordered °- Nuclear stress test or LHC in AM, NPO- with INR 1.1 yesterday  °- Monitor on tele   °  °2. Gout °- Continue home allopurinol °  °3. Diabetes °- Continue home glimepride--hgb A1c 8.1 °- Linagliptin for home  [place] °  °4. Prior PE- patient is supposed to be on warfarin for chronic PE. His INR is normal on admission although he notes compliance. He is having a difficult time hearing and is not familiar with his medications. Will need to review his medications with his pharmacy in the morning to see if he is supposed to be taking warfarin. For now,   will treat with heparin  (coumdain followed by Dr. Hasana  In Jan was on 3 mg alternating with 4 mg every other day)  Also IVC filter placed 2014 °- heparin drip as above °- Medication history with pharmacy in AM °  °5. Hypothyroidism °- Continue home synthroid °- TSH 11.42, free T4 0.74 send to PCP at discharge °  °6. Depression °- Continue home celexa  ° °7.  HLD HDL 39, LDL 58 on lipitor and fenofibrate. °  °   ° °For questions or updates, please contact [facility] °Please consult [email or web address] for contact info under  ° °  °   °Signed, °Noami Bove, NP  °04/21/2020, 8:06 AM   ° °

## 2020-04-21 NOTE — Progress Notes (Signed)
Progress Note  Patient Name: RONEY YOUTZ Date of Encounter: 04/21/2020  Primary Cardiologist: Rozann Lesches, MD   Subjective   No chest pain and no SOB lying almost flat in bed.   Inpatient Medications    Scheduled Meds:  allopurinol  300 mg Oral QPC breakfast   aspirin EC  81 mg Oral QPC breakfast   atorvastatin  40 mg Oral QPC breakfast   cholecalciferol  1,000 Units Oral Daily   citalopram  20 mg Oral Daily   fenofibrate  160 mg Oral Daily   ferrous sulfate  325 mg Oral TID WC   glimepiride  2 mg Oral BID WC   levETIRAcetam  500 mg Oral BID   levothyroxine  25 mcg Oral QAC breakfast   linagliptin  5 mg Oral Daily   midodrine  2.5 mg Oral TID WC   pantoprazole  40 mg Oral Daily   vitamin B-12  1,000 mcg Oral Daily   Continuous Infusions:  heparin 1,400 Units/hr (04/21/20 0517)   PRN Meds: acetaminophen, ondansetron   Vital Signs    Vitals:   04/21/20 0415 04/21/20 0430 04/21/20 0445 04/21/20 0514  BP: 123/89 122/66 132/82 (!) 145/82  Pulse: 82 77 79 74  Resp: (!) 29 (!) 22 (!) 30 20  Temp:    98.3 F (36.8 C)  TempSrc:    Oral  SpO2: (!) 88% (!) 89% 90% 96%    Intake/Output Summary (Last 24 hours) at 04/21/2020 0806 Last data filed at 04/21/2020 0517 Gross per 24 hour  Intake 3028 ml  Output --  Net 3028 ml   Last 3 Weights 11/27/2019 10/23/2019 09/10/2018  Weight (lbs) 271 lb 267 lb 268 lb  Weight (kg) 122.925 kg 121.11 kg 121.564 kg      Telemetry    SR with PACs, and atrial pacing - Personally Reviewed  ECG    No new, RBBB and LAFB - Personally Reviewed  Physical Exam   GEN: No acute distress.   Neck: No JVD Cardiac: RRR, no murmurs, rubs, or gallops.  Respiratory: Clear to auscultation bilaterally. GI: Soft, nontender, non-distended  MS: No edema; No deformity. Neuro:  Nonfocal  Psych: Normal affect   Labs    High Sensitivity Troponin:   Recent Labs  Lab 04/20/20 2258 04/21/20 0020  TROPONINIHS 9 8       Chemistry Recent Labs  Lab 04/20/20 2258  NA 136  K 3.6  CL 96*  CO2 27  GLUCOSE 205*  BUN 10  CREATININE 1.28*  CALCIUM 8.6*  GFRNONAA 52*  GFRAA >60  ANIONGAP 13     Hematology Recent Labs  Lab 04/20/20 2258  WBC 9.7  RBC 4.78  HGB 15.3  HCT 46.0  MCV 96.2  MCH 32.0  MCHC 33.3  RDW 13.2  PLT 174    BNPNo results for input(s): BNP, PROBNP in the last 168 hours.   DDimer No results for input(s): DDIMER in the last 168 hours.   Radiology    DG Chest Port 1 View  Result Date: 04/20/2020 CLINICAL DATA:  Chest pain and shortness of breath EXAM: PORTABLE CHEST 1 VIEW COMPARISON:  08/22/2018 FINDINGS: Cardiac shadow is enlarged. Pacing device is again seen. Lungs are well aerated bilaterally. No focal infiltrate is seen. No bony abnormality is noted. IMPRESSION: No acute abnormality noted. Electronically Signed   By: Inez Catalina M.D.   On: 04/20/2020 22:41    Cardiac Studies   Cardiac cath 2010  Left main  was large vessel, angiographically normal.   LAD was a large vessel coursing to the apex.  It gave off a very large  first diagonal.  It had diffuse luminal irregularities throughout the  vessel.  Throughout the proximal portion, it was approximately 30%  stenotic and in the midsection, there was a 50-60% focal lesion seen  best on the LAO cranial view.  This was likely an eccentric plaque.  There was a 30% lesion in the midportion of the first diagonal.   Left circumflex was a very large dominant system.  It was slightly  ectatic, gave off small-to-moderate-sized ramus with tiny OM-1, a large  first posterolateral, medium-sized second posterolateral, and a PDA.  There was a diffuse 20% lesion throughout the proximal AV groove circ  and then a 30-40% lesion in the mid AV groove circ after the takeoff of  the first posterolateral.  In the midportion of the ramus, there was a  40-50% lesion.  There was a stent in second posterolateral, which was  widely  patent.   Right coronary, there was a small nondominant vessel with no high-grade  stenosis.   Left ventriculogram done in the RAO position showed an EF of 65%.  No  regional wall motion abnormalities.   ASSESSMENT:  1. Coronary artery disease with patent left circumflex stent and      nonobstructive coronary artery disease elsewhere.  2. Normal left ventricular function.   PLAN/DISCUSSION:  Based on his catheterization, his chest pain seems  noncardiac.  If it persist, I would consider a possible GI workup as an  outpatient.  He does have a moderate stenosis in the mid LAD, though I  think this is nonobstructive.  However, with persistent chest pain may  consider a Myoview to further evaluate.   Of note on panning down of his abdominal aorta after the left  ventriculogram, did not appear to have some plaquing down there and a  possible small abdominal aortic aneurysm and may consider a possible CT  scan of the abdomen as an outpatient to further evaluate as necessary by  Dr. Andee Lineman.  nuc at time was neg.  Echo pending.      Patient Profile     85 y.o. male with PMH significant for HLD, prior PE on warfarin, gout, hypothyroidism, CAD, and DM presenting for unstable angina.   Assessment & Plan    1. Unstable angina- Known CAD without any recent evaluation here (last cath 2010 with stent LCX patent and 30-40% stenosis in mid AV groove.  And mid ramus 40-50% stenosis, 30% prox and 50-60% stenosis and 30% diag.    with intermittent chest discomfort with exertion that has progressively worsened and is now occurring at rest.  Pain free currently.   -troponin hs 8 & 9 - Heparin, ACS nomogram - S/p aspirin 324mg   - Continue aspirin 81mg  daily - Echo ordered - Nuclear stress test or LHC in AM, NPO- with INR 1.1 yesterday  - Monitor on tele    2. Gout - Continue home allopurinol  3. Diabetes - Continue home glimepride--hgb A1c 8.1 - Linagliptin for home  sitaglipton  4. Prior PE- patient is supposed to be on warfarin for chronic PE. His INR is normal on admission although he notes compliance. He is having a difficult time hearing and is not familiar with his medications. Will need to review his medications with his pharmacy in the morning to see if he is supposed to be taking warfarin. For now,  will treat with heparin  (coumdain followed by Dr. Jeronimo Greaves  In Jan was on 3 mg alternating with 4 mg every other day)  Also IVC filter placed 2014 - heparin drip as above - Medication history with pharmacy in AM  5. Hypothyroidism - Continue home synthroid - TSH 11.42, free T4 0.74 send to PCP at discharge  6. Depression - Continue home celexa   7.  HLD HDL 39, LDL 58 on lipitor and fenofibrate.      For questions or updates, please contact [facility] Please consult [email or web address] for contact info under        Signed, Nada Boozer, NP  04/21/2020, 8:06 AM

## 2020-04-21 NOTE — H&P (Signed)
Cardiology Admission History and Physical:   Patient ID: [name] MRN: [id number]; DOB: 02/07/1935   Admission date: 04/20/2020  Primary Care Provider: Toma Deiters, MD Primary Cardiologist: Nona Dell, MD   Primary Electrophysiologist:  Hillis Range, MD   Chief Complaint:  Chest pain  Patient Profile:   [name] is a 85 y.o. male with PMH significant for HLD, prior PE on warfarin, gout, hypothyroidism, CAD, and DM who presents with unstable angina.  History of Present Illness:   [name]  States that he has been having intermittent chest pain for approximately the past 2 weeks. He states that he has chest pain and shortness of breath with exertion. His chest pain worsened today and he called 911. He notes chest tightness over his left breast that is worse with exertion. He had pain at rest tonight. He denies any pain at this time. He denies any nausea, palpitations, lightheadedness or dizziness. He notes ongoing difficulties with ambulation due to difficulties with his knee. He is not familiar with his medications but knows that he takes 16 of them. He reports compliance with his warfarin.    Past Medical History:  Diagnosis Date  . Carotid sinus syndrome    Status post pacemaker  . Coronary atherosclerosis of native coronary artery    DES to PL 2006, mild to moderate residual disease 2010   . Essential hypertension   . Mixed hyperlipidemia   . Obstructive sleep apnea    CPAP  . Pacemaker 1986   Medtronic  . Pulmonary emboli [facility])    Diagnosed 2012 and 2014, IVC filter March 2014, recurrent March 2014 - throm[facility]embolic [facility] at [facility] and placed back on anticoagulation  . Pulmonary hypertension (HCC)    Severe, RVSP 85-90 mmHg March 2014   . Stroke (HCC) 11/2012  . Subdural hematoma [facility])    January 2014, occurred on Coumadin  . Type 2 diabetes mellitus (HCC)     Past Surgical History:  Procedure Laterality Date  . Arm surgery Right   . CATARACT  EXTRACTION W/PHACO Right 02/18/2014   Procedure: CATARACT EXTRACTION PHACO AND INTRAOCULAR LENS PLACEMENT (IOC);  Surgeon: Gemma Payor, MD;  Location: [facility];  Service: Ophthalmology;  Laterality: Right;  CDE: 11.83  . CATARACT EXTRACTION W/PHACO Left 03/25/2014   Procedure: CATARACT EXTRACTION PHACO AND INTRAOCULAR LENS PLACEMENT (IOC);  Surgeon: Gemma Payor, MD;  Location: [facility];  Service: Ophthalmology;  Laterality: Left;  CDE 15.93  . CHOLECYSTECTOMY  2011  . EP IMPLANTABLE DEVICE N/A 07/26/2015   MDT Adapta L gen change by Dr Johney Frame  . FILTERING PROCEDURE  11/2012   placed due to blood clots from the lungs  . PACEMAKER IMPLANTED   07/16/2005   Medtronic Kappa 900 DR     Medications Prior to Admission: Prior to Admission medications   Medication Sig Start Date End Date Taking? Authorizing Provider  allopurinol (ZYLOPRIM) 300 MG tablet Take 300 mg by mouth daily after breakfast.     [provider]  aspirin EC 81 MG tablet Take 81 mg by mouth daily after breakfast.    [provider]  atorvastatin (LIPITOR) 40 MG tablet Take 40 mg by mouth daily after breakfast.     [provider]  Cholecalciferol 25 MCG (1000 UT) capsule Take 1,000 Units by mouth daily.    [provider]  citalopram (CELEXA) 20 MG tablet Take 20 mg by mouth daily. 07/15/15   [provider]  colchicine 0.6 MG tablet  Take 0.6 mg by mouth daily as needed (gout attacks).     [provider]  ergocalciferol (VITAMIN D2) 1.25 MG (50000 UT) capsule Take 50,000 Units by mouth once a week.    [provider]  fenofibrate 160 MG tablet Take 160 mg by mouth daily.    [provider]  ferrous sulfate 325 (65 FE) MG tablet Take 325 mg by mouth 3 (three) times daily with meals.    [provider]  glimepiride (AMARYL) 2 MG tablet Take 2 mg by mouth 2 (two) times daily.     [provider]  levETIRAcetam (KEPPRA) 500 MG tablet Take 500 mg by mouth 2  (two) times daily.    [provider]  levothyroxine (SYNTHROID, LEVOTHROID) 25 MCG tablet Take 25 mcg by mouth daily before breakfast.    [provider]  meclizine (ANTIVERT) 25 MG tablet Take 25 mg by mouth 3 (three) times daily as needed for dizziness.    [provider]  metFORMIN (GLUCOPHAGE) 1000 MG tablet Take 1,000 mg by mouth 2 (two) times daily with a meal.    [provider]  midodrine (PROAMATINE) 2.5 MG tablet Take 2.5 mg by mouth 3 (three) times daily with meals.    [provider]  Omega-3 Fatty Acids (FISH OIL) 1000 MG CAPS Take 1 capsule by mouth 3 (three) times daily.    [provider]  omeprazole (PRILOSEC) 40 MG capsule Take 40 mg by mouth daily.      [provider]  ondansetron (ZOFRAN) 4 MG tablet Take 4 mg by mouth every 4 (four) hours as needed for nausea or vomiting.    [provider]  sitaGLIPtin (JANUVIA) 100 MG tablet Take 100 mg by mouth daily.    [provider]  vitamin B-12 (CYANOCOBALAMIN) 1000 MCG tablet Take 1,000 mcg by mouth daily.    [provider]  warfarin (COUMADIN) 3 MG tablet Take 3 mg by mouth every other day. Alternate with 4 mg tablet (daily after breakfast) or as directed by Dr. Natasha Bence    [provider]  warfarin (COUMADIN) 4 MG tablet Take 4 mg by mouth every other day. Alternate with 3 mg tablet (daily after breakfast) or as directed by Dr. Natasha Bence    [provider]     Allergies:    Allergies  Allergen Reactions  . Nitroglycerin Other (See Comments)    Caused decrease in blood pressure    Social History:   Social History   Socioeconomic History  . Marital status: Married    Spouse name: Not on file  . Number of children: Not on file  . Years of education: Not on file  . Highest education level: Not on file  Occupational History  . Not on file  Tobacco Use  . Smoking status: Never Smoker  . Smokeless tobacco: Never Used    . Tobacco comment:   Smoking Status: never  Substance and Sexual Activity  . Alcohol use: No    Alcohol/week: 0.0 standard drinks  . Drug use: No  . Sexual activity: Not on file  Other Topics Concern  . Not on file  Social History Narrative  . Not on file   Social Determinants of Health   Financial Resource Strain:   . Difficulty of Paying Living Expenses:   Food Insecurity:   . Worried About Charity fundraiser in the Last Year:   . [place] in the Last Year:  Transportation Needs:   . Freight forwarder (Medical):   Marland Kitchen Lack of Transportation (Non-Medical):   Physical Activity:   . Days of Exercise per Week:   . Minutes of Exercise per Session:   Stress:   . Feeling of Stress :   Social Connections:   . Frequency of Communication with Friends and Family:   . Frequency of Social Gatherings with Friends and Family:   . Attends Religious Services:   . Active Member of Clubs or Organizations:   . Attends Banker Meetings:   Marland Kitchen Marital Status:   Intimate Partner Violence:   . Fear of Current or Ex-Partner:   . Emotionally Abused:   Marland Kitchen Physically Abused:   . Sexually Abused:     Family History:   The patient's family history includes Diabetes in an other family member; Hypertension in an other family member.    ROS:  Please see the history of present illness.  All other ROS reviewed and negative.     Physical Exam/Data:   Vitals:   04/21/20 0045 04/21/20 0100 04/21/20 0115 04/21/20 0130  BP: 111/61 (!) 130/99 (!) 130/94 (!) 151/87  Pulse: 86 91 86 93  Resp: (!) 24 20 (!) 21 18  Temp:      TempSrc:      SpO2: 91% 94% 95% 91%   No intake or output data in the 24 hours ending 04/21/20 0152 Last 3 Weights 11/27/2019 10/23/2019 09/10/2018  Weight (lbs) 271 lb 267 lb 268 lb  Weight (kg) 122.925 kg 121.11 kg 121.564 kg     There is no height or weight on file to calculate BMI.  General:  Elderly, lying comfortably in bed  HEENT: normal Neck:  no JVD Cardiac:  normal S1, S2; RRR; no murmur   Lungs:  clear to auscultation bilaterally, no wheezing, rhonchi or rales  Abd: soft, nontender, no hepatomegaly  Ext: no edema Musculoskeletal:  No deformities, BUE and BLE strength normal and equal Skin: warm and dry  Neuro:  CNs 2-12 intact, no focal abnormalities noted Psych:  Normal affect    EKG:  The ECG that was done was personally reviewed and demonstrates sinus rhythm with PACs. RBBB and LAFB  Relevant CV Studies: Echo 12/17/13- Reportedly normal EF  Laboratory Data:  High Sensitivity Troponin:   Recent Labs  Lab 04/20/20 2258 04/21/20 0020  TROPONINIHS 9 8      Chemistry Recent Labs  Lab 04/20/20 2258  NA 136  K 3.6  CL 96*  CO2 27  GLUCOSE 205*  BUN 10  CREATININE 1.28*  CALCIUM 8.6*  GFRNONAA 52*  GFRAA >60  ANIONGAP 13    No results for input(s): PROT, ALBUMIN, AST, ALT, ALKPHOS, BILITOT in the last 168 hours. Hematology Recent Labs  Lab 04/20/20 2258  WBC 9.7  RBC 4.78  HGB 15.3  HCT 46.0  MCV 96.2  MCH 32.0  MCHC 33.3  RDW 13.2  PLT 174   BNPNo results for input(s): BNP, PROBNP in the last 168 hours.  DDimer No results for input(s): DDIMER in the last 168 hours.   Radiology/Studies:  DG Chest Port 1 View  Result Date: 04/20/2020 CLINICAL DATA:  Chest pain and shortness of breath EXAM: PORTABLE CHEST 1 VIEW COMPARISON:  08/22/2018 FINDINGS: Cardiac shadow is enlarged. Pacing device is again seen. Lungs are well aerated bilaterally. No focal infiltrate is seen. No bony abnormality is noted. IMPRESSION: No acute abnormality noted. Electronically Signed   By: Loraine Leriche  Lukens M.D.   On: 04/20/2020 22:41   { TIMI Risk Score for Unstable Angina or Non-ST Elevation MI:   The patient's TIMI risk score is 4, which indicates a 20% risk of all cause mortality, new or recurrent myocardial infarction or need for urgent revascularization in the next 14 days.   Assessment and Plan:   [name] is a  85 y.o. male with PMH significant for HLD, prior PE on warfarin, gout, hypothyroidism, CAD, and DM who presents with unstable angina.  1. Unstable angina- Known CAD without any recent evaluation here with intermittent chest discomfort with exertion that has progressively worsened and is now occurring at rest. - Heparin, ACS nomogram - S/p aspirin 324mg   - Continue aspirin 81mg  daily - Echo ordered - Continue fenofibrate and lipitor - Nuclear stress test or LHC in AM, NPO - Monitor on tele - Check HgA1c, TSH, lipid panel   2. Gout - Continue home allopurinol  3. Diabetes - Continue home glimepride - Linagliptin for home sitaglipton  4. Prior PE- patient is supposed to be on warfarin for chronic PE. His INR is normal on admission although he notes compliance. He is having a difficult time hearing and is not familiar with his medications. Will need to review his medications with his pharmacy in the morning to see if he is supposed to be taking warfarin. For now, will treat with heparin - heparin drip as above - Medication history with pharmacy in AM  5. Hypothyroidism - Continue home synthroid - Check TSH, free T4  6. Depression - Continue home celexa   Severity of Illness: The appropriate patient status for this patient is OBSERVATION. Observation status is judged to be reasonable and necessary in order to provide the required intensity of service to ensure the patient's safety. The patient's presenting symptoms, physical exam findings, and initial radiographic and laboratory data in the context of their medical condition is felt to place them at decreased risk for further clinical deterioration. Furthermore, it is anticipated that the patient will be medically stable for discharge from the hospital within 2 midnights of admission. The following factors support the patient status of observation.       For questions or updates, please contact [facility] Please consult  [email or web address] for contact info under        Signed, , MD  04/21/2020 1:52 AM

## 2020-04-21 NOTE — Progress Notes (Signed)
ANTICOAGULATION CONSULT NOTE - Initial Consult  Pharmacy Consult for Heparin  Indication: chest pain/ACS and and history of PE  Allergies  Allergen Reactions  . Nitroglycerin Other (See Comments)    Caused decrease in blood pressure     Vital Signs: Temp: 98.3 F (36.8 C) (06/02 2158) Temp Source: Oral (06/02 2158) BP: 130/68 (06/03 0300) Pulse Rate: 86 (06/03 0300)  Labs: Recent Labs    04/20/20 2258 04/21/20 0020  HGB 15.3  --   HCT 46.0  --   PLT 174  --   APTT 29  --   LABPROT 13.4  --   INR 1.1  --   CREATININE 1.28*  --   TROPONINIHS 9 8    CrCl cannot be calculated (Unknown ideal weight.).   Medical History: Past Medical History:  Diagnosis Date  . Carotid sinus syndrome    Status post pacemaker  . Coronary atherosclerosis of native coronary artery    DES to PL 2006, mild to moderate residual disease 2010   . Essential hypertension   . Mixed hyperlipidemia   . Obstructive sleep apnea    CPAP  . Pacemaker 1986   Medtronic  . Pulmonary emboli [facility])    Diagnosed 2012 and 2014, IVC filter March 2014, recurrent March 2014 - thromboembolic therapy at [facility] and placed back on anticoagulation  . Pulmonary hypertension (HCC)    Severe, RVSP 85-90 mmHg March 2014   . Stroke (HCC) 11/2012  . Subdural hematoma [facility])    January 2014, occurred on Coumadin  . Type 2 diabetes mellitus [facility])     Assessment: 85 y/o M with chest pain to begin heparin. ?on warfarin PTA for history of PE but INR is normal at 1.1 on admission. CBC good. Remote hx SDH.  Goal of Therapy:  Heparin level 0.3-0.7 units/ml Monitor platelets by anticoagulation protocol: Yes   Plan:  Heparin 3000 units BOLUS Start heparin drip at 1400 units/hr 1200 heparin level Daily CBC/HL Monitor for bleeding  Abran Duke, PharmD, BCPS Clinical Pharmacist Phone: [phone]

## 2020-04-21 NOTE — Progress Notes (Signed)
Echocardiogram 2D Echocardiogram has been performed.  [name] [name] 04/21/2020, 12:57 PM

## 2020-04-21 NOTE — Progress Notes (Signed)
ANTICOAGULATION CONSULT NOTE  Pharmacy Consult for Heparin  Indication: chest pain/ACS and and history of PE  Allergies  Allergen Reactions   Nitroglycerin Other (See Comments)    Caused decrease in blood pressure     Vital Signs: Temp: 98.3 F (36.8 C) (06/03 0514) Temp Source: Oral (06/03 0514) BP: 137/73 (06/03 1533) Pulse Rate: 79 (06/03 1533)  Labs: Recent Labs    04/20/20 2258 04/21/20 0020  HGB 15.3  --   HCT 46.0  --   PLT 174  --   APTT 29  --   LABPROT 13.4  --   INR 1.1  --   CREATININE 1.28*  --   TROPONINIHS 9 8    Estimated Creatinine Clearance: 59.7 mL/min (A) (by C-G formula based on SCr of 1.28 mg/dL (H)).   Medical History: Past Medical History:  Diagnosis Date   Carotid sinus syndrome    Status post pacemaker   Coronary atherosclerosis of native coronary artery    DES to PL 2006, mild to moderate residual disease 2010    Essential hypertension    Mixed hyperlipidemia    Obstructive sleep apnea    CPAP   Pacemaker 1986   Medtronic   Pulmonary emboli (HCC)    Diagnosed 2012 and 2014, IVC filter March 2014, recurrent March 2014 - throm[facility]embolic [facility] at [facility] and placed back on anticoagulation   Pulmonary hypertension (HCC)    Severe, RVSP 85-90 mmHg March 2014    Stroke [facility]) 11/2012   Subdural hematoma [facility])    January 2014, occurred on Coumadin   Type 2 diabetes mellitus (HCC)     Assessment: 85 y/o M with chest pain to begin heparin. ?on warfarin PTA for history of PE but INR is normal at 1.1 on admission. CBC good. Remote hx SDH.  Pt now s/p cath, to resume IV heparin 6h after sheath pull (~1400) while deciding on longterm oral anticoagulation plan.   Goal of Therapy:  Heparin level 0.3-0.7 units/ml Monitor platelets by anticoagulation protocol: Yes   Plan:  -Resume heparin 1400 units/h no bolus at 2000 -Check 8hr heparin level   Fredonia Highland, PharmD, BCPS Clinical Pharmacist [phone] Please check AMION  for all [facility] Pharmacy numbers 04/21/2020

## 2020-04-21 NOTE — ED Notes (Signed)
Please call wife Eleanors @ [phone] with a status update--[name]

## 2020-04-21 NOTE — Interval H&P Note (Signed)
Cath Lab Visit (complete for each Cath Lab visit)  Clinical Evaluation Leading to the Procedure:   ACS: Yes.    Non-ACS:    Anginal Classification: CCS II  Anti-ischemic medical therapy: Minimal Therapy (1 class of medications)  Non-Invasive Test Results: No non-invasive testing performed  Prior CABG: No previous CABG      History and Physical Interval Note:  04/21/2020 1:12 PM  [name]  has presented today for surgery, with the diagnosis of unstable angina.  The various methods of treatment have been discussed with the patient and family. After consideration of risks, benefits and other options for treatment, the patient has consented to  Procedure(s): LEFT HEART CATH AND CORONARY ANGIOGRAPHY (N/A) as a surgical intervention.  The patient's history has been reviewed, patient examined, no change in status, stable for surgery.  I have reviewed the patient's chart and labs.  Questions were answered to the patient's satisfaction.     Nanetta Batty

## 2020-04-21 NOTE — Progress Notes (Addendum)
ddimer is elevated will check chest CTA to rule out PE with low INR on admit along with some hypoxemia and chest pain, sob and stable coronary arteries

## 2020-04-21 NOTE — Discharge Summary (Signed)
Discharge Summary    Patient ID: [name] MRN: [id number]; DOB: 12/22/1934  Admit date: 04/20/2020 Discharge date: 04/22/2020  Primary Care Provider: Toma Deiters, MD  Primary Cardiologist: Nona Dell, MD  Primary Electrophysiologist:  Hillis Range, MD   Discharge Diagnoses    Principal Problem:   Chest pain of uncertain etiology  cardiac cath with non obstructive disease 04/21/20 Active Problems:   HLD (hyperlipidemia)   SLEEP APNEA, OBSTRUCTIVE   Coronary atherosclerosis of native coronary artery   Pacemaker-Medtronic   Unstable angina (HCC)   Type 2 diabetes mellitus without complication, without long-term current use of insulin [facility])    Diagnostic Studies/Procedures    Cardiac cath 04/21/20 IMPRESSION: Mr. Gencarelli has noncritical CAD.  It was difficult to visualize his posterior lateral artery stent appears to potentially have 40 to 50% "in-stent restenosis.  The remainder of his anatomy has minimal noncritical CAD and he has normal filling pressures.  I suspect his pain is noncardiac.  A right common femoral angiogram was performed by Mynx closure device was successfully deployed achieving hemostasis.  The patient left lab in stable condition. _____________  Echo 04/21/20 IMPRESSIONS    1. Left ventricular ejection fraction, by estimation, is 55 to 60%. The  left ventricle has normal function. The left ventricle has no regional  wall motion abnormalities. Left ventricular diastolic parameters are  consistent with Grade I diastolic  dysfunction (impaired relaxation).  2. Right ventricular systolic function is normal. The right ventricular  size is normal. There is mildly elevated pulmonary artery systolic  pressure.  3. The mitral valve is normal in structure. Trivial mitral valve  regurgitation. No evidence of mitral stenosis.  4. The aortic valve is normal in structure. Aortic valve regurgitation is  not visualized. No aortic stenosis is present.    FINDINGS  Left Ventricle: Left ventricular ejection fraction, by estimation, is 55  to 60%. The left ventricle has normal function. The left ventricle has no  regional wall motion abnormalities. The left ventricular internal cavity  size was normal in size. There is  no left ventricular hypertrophy. Left ventricular diastolic parameters  are consistent with Grade I diastolic dysfunction (impaired relaxation).   Right Ventricle: The right ventricular size is normal. No increase in  right ventricular wall thickness. Right ventricular systolic function is  normal. There is mildly elevated pulmonary artery systolic pressure. The  tricuspid regurgitant velocity is 2.66  m/s, and with an assumed right atrial pressure of 8 mmHg, the estimated  right ventricular systolic pressure is 36.3 mmHg.   Left Atrium: Left atrial size was normal in size.   Right Atrium: Right atrial size was normal in size.   Pericardium: There is no evidence of pericardial effusion.   Mitral Valve: The mitral valve is normal in structure. Trivial mitral  valve regurgitation. No evidence of mitral valve stenosis.   Tricuspid Valve: The tricuspid valve is normal in structure. Tricuspid  valve regurgitation is trivial. No evidence of tricuspid stenosis.   Aortic Valve: The aortic valve is normal in structure. Aortic valve  regurgitation is not visualized. No aortic stenosis is present.   Pulmonic Valve: The pulmonic valve was normal in structure. Pulmonic valve  regurgitation is not visualized. No evidence of pulmonic stenosis.   Aorta: The aortic root and ascending aorta are structurally normal, with  no evidence of dilitation.   IAS/Shunts: The atrial septum is grossly normal.      History of Present Illness     Jermaine Expose  Hughes is a 85 y.o. male with PMH significant for HLD, prior PE on warfarin, gout, hypothyroidism, CAD, and DM.  Pt has been having intermittent chest pain for 2 weeks.  Occurs with  exertion along with dyspnea - the tightness was worse yesterday 04/20/20 with tightness over Left breast worse with exertion.  He also had pain at rest.  No nausea, palpitations, lightheadedness, no dizziness. He thought he was taking warfarin.  His INR was 1.1.    Labs Na 136, K+ 3.6, glucose 205, Cr 1.28  Hs troponin 9 and 8 Tchol 117, HDL 39, LDL 101  Hgb 15.3  hgb A1c of 8.1 and TSH 11.428, free T4 0.74  COVID neg.   Pt was placed on IV heparin and admitted for further eval.   Hospital Course     Consultants: none   Pt was stable this AM no further pain at rest.  On IV heparin.  With hx of nonobstructive disease in 2010 and stent to LCX pt underwent cardiac cath as above and echo.  No cardiac reason for chest pain.  On call to his pharmacy he has not had warfarin filled since 2019 was on for chronic PE he dose have IVC filter.  Pt  Underwent cath and found to have non obstructive disease.  ddimer was elevated and CTA of chest without PE, no acute process.   His thyroid was abnromal will have him follow up with PCP.  He will hold metformin until 04/24/20 then resume. He will follow up with PCP for home meds as well.  His coumadin has been stopped now on xarelto.  His diabetes is mildly uncontrolled.  To follow up with PCP.   Today he has a headache, he has these at times.  Also mild nausea which is also normal for him.     He was seen and evaluated by Dr. Rennis Golden and found stable for discharge. .  Did the patient have an acute coronary syndrome (MI, NSTEMI, STEMI, etc) this admission?:  No                               Did the patient have a percutaneous coronary intervention (stent / angioplasty)?:  No.   _____________  Discharge Vitals Blood pressure 140/73, pulse 79, temperature 98 F (36.7 C), temperature source Oral, resp. rate 18, height 6' (1.829 m), weight 126.1 kg, SpO2 93 %.  Filed Weights   04/21/20 1047 04/21/20 1610 04/22/20 0500  Weight: 117.5 kg 123 kg 126.1 kg    General:Pleasant affect, NAD Skin:Warm and dry, brisk capillary refill HEENT:normocephalic, sclera clear, mucus membranes moist Neck:supple, no JVD  Heart:S1S2 RRR without murmur, gallup, rub or click, Rt groin cath site without hematoma, no pain and mild bruising Lungs:clear without rales, rhonchi, or wheezes BHA:LPFXT,KWIO, non tender, + BS, do not palpate liver spleen or masses Ext:no lower ext edema to trace, 2+ pedal pulses, 2+ radial pulses Neuro:alert and oriented X 3, MAE, follows commands, + facial symmetry   Labs & Radiologic Studies    CBC Recent Labs    04/20/20 2258  WBC 9.7  NEUTROABS 7.4  HGB 15.3  HCT 46.0  MCV 96.2  PLT 174   Basic Metabolic Panel Recent Labs    97/35/32 2258  NA 136  K 3.6  CL 96*  CO2 27  GLUCOSE 205*  BUN 10  CREATININE 1.28*  CALCIUM 8.6*   Liver Function Tests No results  for input(s): AST, ALT, ALKPHOS, BILITOT, PROT, ALBUMIN in the last 72 hours. No results for input(s): LIPASE, AMYLASE in the last 72 hours. High Sensitivity Troponin:   Recent Labs  Lab 04/20/20 2258 04/21/20 0020  TROPONINIHS 9 8    BNP Invalid input(s): POCBNP D-Dimer Recent Labs    04/21/20 1601  DDIMER 1.33*   Hemoglobin A1C Recent Labs    04/21/20 0644  HGBA1C 8.1*   Fasting Lipid Panel Recent Labs    04/21/20 0644  CHOL 117  HDL 39*  LDLCALC 58  TRIG 161101  CHOLHDL 3.0   Thyroid Function Tests Recent Labs    04/21/20 0644  TSH 11.428*   _____________  CT ANGIO CHEST PE W OR WO CONTRAST  Result Date: 04/21/2020 CLINICAL DATA:  Shortness of breath, hypoxemia, elevated D dimer EXAM: CT ANGIOGRAPHY CHEST WITH CONTRAST TECHNIQUE: Multidetector CT imaging of the chest was performed using the standard protocol during bolus administration of intravenous contrast. Multiplanar CT image reconstructions and MIPs were obtained to evaluate the vascular anatomy. CONTRAST:  100mL OMNIPAQUE IOHEXOL 350 MG/ML SOLN COMPARISON:  04/20/2020  FINDINGS: Cardiovascular: This is a technically adequate evaluation of the pulmonary vasculature. No filling defects or pulmonary emboli. The heart is not enlarged. No pericardial effusion. Normal caliber of the thoracic aorta. There is moderate atherosclerosis of the coronary vasculature greatest in the LAD distribution. Minimal atherosclerosis of the aortic arch. Mediastinum/Nodes: No enlarged mediastinal, hilar, or axillary lymph nodes. Thyroid gland, trachea, and esophagus demonstrate no significant findings. Lungs/Pleura: No acute airspace disease, effusion, or pneumothorax. Central airways are patent. Upper Abdomen: No acute abnormality.  IVC filter incidentally noted. Musculoskeletal: No acute or destructive bony lesions. Reconstructed images demonstrate no additional findings. Review of the MIP images confirms the above findings. IMPRESSION: 1. No evidence of pulmonary embolus. 2. No acute intrathoracic process. 3.  Aortic Atherosclerosis (ICD10-I70.0). Electronically Signed   By: Sharlet SalinaMichael  Brown M.D.   On: 04/21/2020 21:16   CARDIAC CATHETERIZATION  Result Date: 04/21/2020  3rd Mrg lesion is 40% stenosed.  2nd LPL lesion is 50% stenosed.  [name] is a 85 y.o. male  [id number] LOCATION:  FACILITY: [facility] PHYSICIAN: Nanetta BattyJonathan Berry, M.D. 08-Jan-1935 DATE OF PROCEDURE:  04/21/2020 DATE OF DISCHARGE: CARDIAC CATHETERIZATION History obtained from chart review.85 y.o. male with PMH significant for HLD, prior PE on warfarin, gout, hypothyroidism, CAD, and DM presenting for unstable angina.   His cardiac enzymes were normal.  His EKG showed no acute changes.  He presents now for diagnostic coronary angiography.   Mr. Vedia Pereyraerdue has noncritical CAD.  It was difficult to visualize his posterior lateral artery stent appears to potentially have 40 to 50% "in-stent restenosis.  The remainder of his anatomy has minimal noncritical CAD and he has normal filling pressures.  I suspect his pain is noncardiac.  A right  common femoral angiogram was performed by Mynx closure device was successfully deployed achieving hemostasis.  The patient left lab in stable condition. Nanetta BattyJonathan Berry. MD, [facility] CenterFACC 04/21/2020 2:04 PM   DG Chest Port 1 View  Result Date: 04/20/2020 CLINICAL DATA:  Chest pain and shortness of breath EXAM: PORTABLE CHEST 1 VIEW COMPARISON:  08/22/2018 FINDINGS: Cardiac shadow is enlarged. Pacing device is again seen. Lungs are well aerated bilaterally. No focal infiltrate is seen. No bony abnormality is noted. IMPRESSION: No acute abnormality noted. Electronically Signed   By: Alcide CleverMark  Lukens M.D.   On: 04/20/2020 22:41   ECHOCARDIOGRAM COMPLETE  Result Date: 04/21/2020  ECHOCARDIOGRAM REPORT   Patient Name:   [name] Date of Exam: 04/21/2020 Medical Rec #:  [id number]      Height:       73.0 in Accession #:    [id number]     Weight:       259.0 lb Date of Birth:  1935/08/30      BSA:          2.402 m Patient Age:    7 years       BP:           145/82 mmHg Patient Gender: M              HR:           77 bpm. Exam Location:  Inpatient Procedure: 2D Echo, Color Doppler and Cardiac Doppler Indications:    R07.9* Chest pain, unspecified  History:        Patient has no prior history of Echocardiogram examinations,                 most recent 12/17/2013. Pulmonary HTN; Risk Factors:Hypertension,                 Dyslipidemia and Sleep Apnea.  Sonographer:    Raquel Sarna Senior RDCS Referring Phys: [place]  1. Left ventricular ejection fraction, by estimation, is 55 to 60%. The left ventricle has normal function. The left ventricle has no regional wall motion abnormalities. Left ventricular diastolic parameters are consistent with Grade I diastolic dysfunction (impaired relaxation).  2. Right ventricular systolic function is normal. The right ventricular size is normal. There is mildly elevated pulmonary artery systolic pressure.  3. The mitral valve is normal in structure. Trivial mitral valve  regurgitation. No evidence of mitral stenosis.  4. The aortic valve is normal in structure. Aortic valve regurgitation is not visualized. No aortic stenosis is present. FINDINGS  Left Ventricle: Left ventricular ejection fraction, by estimation, is 55 to 60%. The left ventricle has normal function. The left ventricle has no regional wall motion abnormalities. The left ventricular internal cavity size was normal in size. There is  no left ventricular hypertrophy. Left ventricular diastolic parameters are consistent with Grade I diastolic dysfunction (impaired relaxation). Right Ventricle: The right ventricular size is normal. No increase in right ventricular wall thickness. Right ventricular systolic function is normal. There is mildly elevated pulmonary artery systolic pressure. The tricuspid regurgitant velocity is 2.66  m/s, and with an assumed right atrial pressure of 8 mmHg, the estimated right ventricular systolic pressure is 28.3 mmHg. Left Atrium: Left atrial size was normal in size. Right Atrium: Right atrial size was normal in size. Pericardium: There is no evidence of pericardial effusion. Mitral Valve: The mitral valve is normal in structure. Trivial mitral valve regurgitation. No evidence of mitral valve stenosis. Tricuspid Valve: The tricuspid valve is normal in structure. Tricuspid valve regurgitation is trivial. No evidence of tricuspid stenosis. Aortic Valve: The aortic valve is normal in structure. Aortic valve regurgitation is not visualized. No aortic stenosis is present. Pulmonic Valve: The pulmonic valve was normal in structure. Pulmonic valve regurgitation is not visualized. No evidence of pulmonic stenosis. Aorta: The aortic root and ascending aorta are structurally normal, with no evidence of dilitation. IAS/Shunts: The atrial septum is grossly normal.  LEFT VENTRICLE PLAX 2D LVIDd:         4.00 cm  Diastology LVIDs:         2.10 cm  LV e' lateral:  5.55 cm/s LV PW:         1.20 cm  LV E/e'  lateral: 12.7 LV IVS:        1.10 cm  LV e' medial:    4.35 cm/s LVOT diam:     2.00 cm  LV E/e' medial:  16.2 LV SV:         61 LV SV Index:   25 LVOT Area:     3.14 cm  RIGHT VENTRICLE RV S prime:     14.60 cm/s TAPSE (M-mode): 1.9 cm LEFT ATRIUM           Index       RIGHT ATRIUM           Index LA diam:      3.20 cm 1.33 cm/m  RA Area:     16.35 cm LA Vol (A2C): 33.8 ml 14.07 ml/m RA Volume:   42.45 ml  17.68 ml/m LA Vol (A4C): 17.1 ml 7.12 ml/m  AORTIC VALVE LVOT Vmax:   82.10 cm/s LVOT Vmean:  56.000 cm/s LVOT VTI:    0.193 m  AORTA Ao Root diam: 3.70 cm MITRAL VALVE               TRICUSPID VALVE MV Area (PHT): 2.54 cm    TR Peak grad:   28.3 mmHg MV Decel Time: 299 msec    TR Vmax:        266.00 cm/s MV E velocity: 70.40 cm/s MV A velocity: 97.90 cm/s  SHUNTS MV E/A ratio:  0.72        Systemic VTI:  0.19 m                            Systemic Diam: 2.00 cm Kristeen Miss MD Electronically signed by Kristeen Miss MD Signature Date/Time: 04/21/2020/2:19:06 PM    Final    Disposition   Pt is being discharged home today in good condition.  Follow-up Plans & Appointments    Follow-up Information    Jonelle Sidle, MD Follow up on 05/13/2020.   Specialty: Cardiology Why: appt at 11:20 AM  Contact information: [address] [place] [address] [phone]           Call [facility] at [phone] if any bleeding, swelling or drainage at cath site.  May shower, no tub baths for 48 hours for groin sticks. No lifting over 5 pounds for 3 days.  No Driving for 3 days.    Call the office if any problems  Heart healthy diabetic diet.   We stopped your warfarin and changed to xarelto  Call the office if any issues.  This is once a day anticoagulation. Stop warfarin.    Make appointment with your primary MD for follow up   If you were on Metformin, I do not have on list-- but do not take if you do take until 04/24/20 it can interact with cath dye.     Discharge Medications   Allergies as of 04/22/2020      Reactions   Nitroglycerin Other (See Comments)   Caused decrease in blood pressure      Medication List    STOP taking these medications   warfarin 3 MG tablet Commonly known as: COUMADIN   warfarin 4 MG tablet Commonly known as: COUMADIN     TAKE these medications   acetaminophen 325 MG tablet Commonly known as: TYLENOL  Take 2 tablets (650 mg total) by mouth every 4 (four) hours as needed for headache or mild pain.   allopurinol 300 MG tablet Commonly known as: ZYLOPRIM Take 300 mg by mouth daily after breakfast.   aspirin EC 81 MG tablet Take 81 mg by mouth daily after breakfast.   atorvastatin 40 MG tablet Commonly known as: LIPITOR Take 40 mg by mouth daily after breakfast.   Cholecalciferol 25 MCG (1000 UT) capsule Take 1,000 Units by mouth daily.   citalopram 20 MG tablet Commonly known as: CELEXA Take 20 mg by mouth daily.   colchicine 0.6 MG tablet Take 0.6 mg by mouth daily as needed (gout attacks).   colestipol 1 g tablet Commonly known as: COLESTID Take 1 g by mouth 2 (two) times daily.   fenofibrate 160 MG tablet Take 160 mg by mouth daily.   ferrous sulfate 325 (65 FE) MG tablet Take 325 mg by mouth 3 (three) times daily with meals.   Fish Oil 1000 MG Caps Take 1 capsule by mouth 3 (three) times daily.   furosemide 20 MG tablet Commonly known as: LASIX Take 20 mg by mouth daily.   gabapentin 100 MG capsule Commonly known as: NEURONTIN Take 100 mg by mouth 3 (three) times daily.   glimepiride 2 MG tablet Commonly known as: AMARYL Take 1 tablet (2 mg total) by mouth 2 (two) times daily with a meal. What changed: when to take this   levETIRAcetam 500 MG tablet Commonly known as: KEPPRA Take 500 mg by mouth 2 (two) times daily.   levothyroxine 100 MCG tablet Commonly known as: SYNTHROID Take 100 mcg by mouth daily.   meclizine 25 MG tablet Commonly known as: ANTIVERT Take  25 mg by mouth 3 (three) times daily as needed for dizziness.   midodrine 2.5 MG tablet Commonly known as: PROAMATINE Take 2.5 mg by mouth 3 (three) times daily with meals.   omeprazole 40 MG capsule Commonly known as: PRILOSEC Take 40 mg by mouth daily.   ondansetron 4 MG tablet Commonly known as: ZOFRAN Take 4 mg by mouth every 4 (four) hours as needed for nausea or vomiting.   rivaroxaban 20 MG Tabs tablet Commonly known as: XARELTO Take 1 tablet (20 mg total) by mouth daily with supper.   sitaGLIPtin 100 MG tablet Commonly known as: JANUVIA Take 100 mg by mouth daily.   vitamin B-12 1000 MCG tablet Commonly known as: CYANOCOBALAMIN Take 1,000 mcg by mouth daily.          Outstanding Labs/Studies   BMP   Duration of Discharge Encounter   Greater than 30 minutes including physician time.  Signed, Nada Boozer, NP 04/22/2020, 12:32 PM

## 2020-04-21 NOTE — Progress Notes (Signed)
   Cath films reviewed and discussed with Dr. Allyson Sabal, no significant obstructive CAD. [name] has a history of recurrent PE - s/p IVC filter. Has not been compliant with warfarin - INR 1.1 on admit. Will check d-dimer, may need PE eval if abnormal. Continue IV heparin - if positive for PE, will switch to Xarelto treatment dose. If negative, start low dose Xarelto 10 mg daily for prevention. Echo personally reviewed and it is reassuring with normal LV and RV function, no pulmonary hypertension. Follow-up with Dr. Diona Browner.  Chrystie Nose, MD, [facility], FACP  [place]  [facility]  Medical Director of the Advanced Lipid Disorders &  Cardiovascular Risk Reduction Clinic Diplomate of the American Board of Clinical Lipidology Attending Cardiologist  Direct Dial: [phone]  Fax: [phone]  Website:  [email or web address]

## 2020-04-21 NOTE — Progress Notes (Signed)
Inpatient Diabetes Program Recommendations  AACE/ADA: New Consensus Statement on Inpatient Glycemic Control (2015)  Target Ranges:  Prepandial:   less than 140 mg/dL      Peak postprandial:   less than 180 mg/dL (1-2 hours)      Critically ill patients:  140 - 180 mg/dL   Results for NISHANT, SCHRECENGOST (MRN [id number]) as of 04/21/2020 14:29  Ref. Range 04/21/2020 09:34 04/21/2020 11:30 04/21/2020 14:13  Glucose-Capillary Latest Ref Range: 70 - 99 mg/dL 932 (H) 355 (H) 732 (H)    Admit with: Unstable Angina  History: DM  Home DM Meds: Amaryl 2 mg BID       Metformin 1000 mg BID       Januvia 100 mg Daily  Current Orders: Tradjenta 5 mg Daily       Amaryl 2 mg BID    MD- Please consider adding Novolog Sensitive Correction Scale/ SSI (0-9 units) TID AC + HS while patient in hospital setting     --Will follow patient during hospitalization--  Ambrose Finland RN, MSN, CDE Diabetes Coordinator Inpatient Glycemic Control Team Team Pager: [phone] (8a-5p)

## 2020-04-22 DIAGNOSIS — I2510 Atherosclerotic heart disease of native coronary artery without angina pectoris: Secondary | ICD-10-CM | POA: Diagnosis not present

## 2020-04-22 DIAGNOSIS — R079 Chest pain, unspecified: Secondary | ICD-10-CM | POA: Diagnosis not present

## 2020-04-22 DIAGNOSIS — E119 Type 2 diabetes mellitus without complications: Secondary | ICD-10-CM

## 2020-04-22 DIAGNOSIS — Z950 Presence of cardiac pacemaker: Secondary | ICD-10-CM | POA: Diagnosis not present

## 2020-04-22 DIAGNOSIS — G4733 Obstructive sleep apnea (adult) (pediatric): Secondary | ICD-10-CM | POA: Diagnosis not present

## 2020-04-22 LAB — BASIC METABOLIC PANEL
Anion gap: 8 (ref 5–15)
BUN: 6 mg/dL — ABNORMAL LOW (ref 8–23)
CO2: 30 mmol/L (ref 22–32)
Calcium: 8.2 mg/dL — ABNORMAL LOW (ref 8.9–10.3)
Chloride: 98 mmol/L (ref 98–111)
Creatinine, Ser: 1.27 mg/dL — ABNORMAL HIGH (ref 0.61–1.24)
GFR calc Af Amer: >60 mL/min (ref >60)
GFR calc non Af Amer: 52 mL/min — ABNORMAL LOW (ref >60)
Glucose, Bld: 153 mg/dL — ABNORMAL HIGH (ref 70–99)
Potassium: 3.4 mmol/L — ABNORMAL LOW (ref 3.5–5.1)
Sodium: 136 mmol/L (ref 135–145)

## 2020-04-22 LAB — HEPARIN LEVEL (UNFRACTIONATED): Heparin Unfractionated: 0.34 IU/mL (ref 0.30–0.70)

## 2020-04-22 LAB — GLUCOSE, CAPILLARY
Glucose-Capillary: 153 mg/dL — ABNORMAL HIGH (ref 70–99)
Glucose-Capillary: 167 mg/dL — ABNORMAL HIGH (ref 70–99)

## 2020-04-22 MED ORDER — TRAMADOL HCL 50 MG PO TABS
50.0000 mg | ORAL_TABLET | Freq: Four times a day (QID) | ORAL | Status: DC | PRN
  Filled 2020-04-22: qty 1

## 2020-04-22 MED ORDER — GLIMEPIRIDE 2 MG PO TABS: 2.0000 mg | ORAL_TABLET | Freq: Two times a day (BID) | ORAL | 1 refills | Status: DC

## 2020-04-22 MED ORDER — POTASSIUM CHLORIDE CRYS ER 20 MEQ PO TBCR
20.0000 meq | EXTENDED_RELEASE_TABLET | Freq: Once | ORAL | Status: AC
  Administered 2020-04-22: 20 meq via ORAL
  Filled 2020-04-22: qty 1

## 2020-04-22 MED ORDER — RIVAROXABAN 20 MG PO TABS
20.0000 mg | ORAL_TABLET | Freq: Every day | ORAL | Status: DC
  Administered 2020-04-22: 20 mg via ORAL
  Filled 2020-04-22: qty 1

## 2020-04-22 MED ORDER — RIVAROXABAN 20 MG PO TABS: 20.0000 mg | ORAL_TABLET | Freq: Every day | ORAL | 6 refills | Status: DC

## 2020-04-22 MED ORDER — ACETAMINOPHEN 325 MG PO TABS: 650.0000 mg | ORAL_TABLET | ORAL | Status: DC | PRN

## 2020-04-22 NOTE — Progress Notes (Signed)
ANTICOAGULATION CONSULT NOTE  Pharmacy Consult for Heparin  Indication: chest pain/ACS and and history of PE  Allergies  Allergen Reactions  . Nitroglycerin Other (See Comments)    Caused decrease in blood pressure     Vital Signs: Temp: 98 F (36.7 C) (06/04 0741) Temp Source: Oral (06/04 0741) BP: 140/73 (06/04 0741) Pulse Rate: 79 (06/04 0741)  Labs: Recent Labs    04/20/20 2258 04/21/20 0020 04/22/20 0435  HGB 15.3  --   --   HCT 46.0  --   --   PLT 174  --   --   APTT 29  --   --   LABPROT 13.4  --   --   INR 1.1  --   --   HEPARINUNFRC  --   --  0.34  CREATININE 1.28*  --   --   TROPONINIHS 9 8  --     Estimated Creatinine Clearance: 61 mL/min (A) (by C-G formula based on SCr of 1.28 mg/dL (H)).   Medical History: Past Medical History:  Diagnosis Date  . Carotid sinus syndrome    Status post pacemaker  . Coronary atherosclerosis of native coronary artery    DES to PL 2006, mild to moderate residual disease 2010   . Essential hypertension   . Mixed hyperlipidemia   . Obstructive sleep apnea    CPAP  . Pacemaker 1986   Medtronic  . Pulmonary emboli [facility])    Diagnosed 2012 and 2014, IVC filter March 2014, recurrent March 2014 - thromboembolic therapy at [facility] and placed back on anticoagulation  . Pulmonary hypertension (HCC)    Severe, RVSP 85-90 mmHg March 2014   . Stroke (HCC) 11/2012  . Subdural hematoma [facility])    January 2014, occurred on Coumadin  . Type 2 diabetes mellitus (HCC)   . Type 2 diabetes mellitus without complication, without long-term current use of insulin (HCC) 04/22/2020    Assessment: 85 y/o M with chest pain to begin heparin. ?on warfarin PTA for history of PE but INR is normal at 1.1 on admission. Remote hx SDH. Pt now s/p cath on IV heparin while deciding on longterm oral anticoagulation plan. CT negative for PE -heparin level at goal    Goal of Therapy:  Heparin level 0.3-0.7 units/ml Monitor platelets by anticoagulation  protocol: Yes   Plan:  -Continue heparin at 1400 units/hr -Daily heparin level and CBC -Will follow anticoagulation plans  Harland German, PharmD Clinical Pharmacist **Pharmacist phone directory can now be found on [email or web address] ([email or web address]).  Listed under [facility].

## 2020-04-22 NOTE — Progress Notes (Signed)
ANTICOAGULATION CONSULT NOTE  Pharmacy Consult for Rivaroxaban Dosing  Indication: chest pain/ACS and and history of PE  Allergies  Allergen Reactions  . Nitroglycerin Other (See Comments)    Caused decrease in blood pressure     Vital Signs: Temp: 98 F (36.7 C) (06/04 0741) Temp Source: Oral (06/04 0741) BP: 140/73 (06/04 0741) Pulse Rate: 79 (06/04 0741)  Labs: Recent Labs    04/20/20 2258 04/21/20 0020 04/22/20 0435  HGB 15.3  --   --   HCT 46.0  --   --   PLT 174  --   --   APTT 29  --   --   LABPROT 13.4  --   --   INR 1.1  --   --   HEPARINUNFRC  --   --  0.34  CREATININE 1.28*  --   --   TROPONINIHS 9 8  --     Estimated Creatinine Clearance: 61 mL/min (A) (by C-G formula based on SCr of 1.28 mg/dL (H)).   Medical History: Past Medical History:  Diagnosis Date  . Carotid sinus syndrome    Status post pacemaker  . Coronary atherosclerosis of native coronary artery    DES to PL 2006, mild to moderate residual disease 2010   . Essential hypertension   . Mixed hyperlipidemia   . Obstructive sleep apnea    CPAP  . Pacemaker 1986   Medtronic  . Pulmonary emboli [facility])    Diagnosed 2012 and 2014, IVC filter March 2014, recurrent March 2014 - throm[facility]embolic therapy at [facility] and placed back on anticoagulation  . Pulmonary hypertension (HCC)    Severe, RVSP 85-90 mmHg March 2014   . Stroke (HCC) 11/2012  . Subdural hematoma [facility])    January 2014, occurred on Coumadin  . Type 2 diabetes mellitus (HCC)   . Type 2 diabetes mellitus without complication, without long-term current use of insulin (HCC) 04/22/2020    Assessment: 85 y/o M with chest pain to begin heparin. ?on warfarin PTA for history of PE but INR is normal at 1.1 on admission. Remote hx SDH. Pt now s/p cath on IV heparin. CT negative for PE.  Goal of Therapy:  Prevent future PE/DVT  Monitor platelets by anticoagulation protocol: Yes   Plan:  -Continue heparin at 1400 units/hr unitl a switch is  made to rivaroxaban  -Daily CBC  -Initiate Rivaroxaban 20mg  daily following discontinuation of heparin gtt (dose selected based on lack of an active DVT/PE requiring load, but need for higher dose based on hx of PE requiring hospitalization)   , PharmD-Candidate

## 2020-04-22 NOTE — TOC Benefit Eligibility Note (Addendum)
Transition of Care [facility]) Benefit Eligibility Note    Patient Details  Name: [name] MRN: [id number] Date of Birth: 1935-08-12   Medication/Dose: Xarelto 20 mg daily 30 day supply, Eliquis 5mg . bid $131.00 for 30 day supply  Covered?: Yes  Tier: 3 Drug  Prescription Coverage Preferred Pharmacy: with Person/Company/Phone Number:: [phone] -[place]ella Dandy  Co-Pay: $129.41 Xarelto - Eliquis 5mg  bid $131.00  Prior Approval: No  Deductible: (no deductible)  Additional Notes: Pt. In coverage gap.    Phone Number: 04/22/2020, 12:43 PM

## 2020-04-22 NOTE — Discharge Instructions (Signed)
Call [facility] at [phone] if any bleeding, swelling or drainage at cath site.  May shower, no tub baths for 48 hours for groin sticks. No lifting over 5 pounds for 3 days.  No Driving for 3 days.    Call the office if any problems  Heart healthy diabetic diet.   We stopped your warfarin and changed to xarelto  Call the office if any issues.  This is once a day anticoagulation. Stop warfarin.    Make appointment with your primary MD for follow up   If you were on Metformin, I do not have on list-- but do not take if you do take until 04/24/20 it can interact with cath dye.    Information on my medicine - XARELTO (Rivaroxaban)    Why was Xarelto prescribed for you? Xarelto was prescribed for you to reduce the risk of a blood clot forming that can cause a stroke if you have a medical condition called atrial fibrillation (a type of irregular heartbeat).  What do you need to know about xarelto ? Take your Xarelto ONCE DAILY at the same time every day with your evening meal. If you have difficulty swallowing the tablet whole, you may crush it and mix in applesauce just prior to taking your dose.  Take Xarelto exactly as prescribed by your doctor and DO NOT stop taking Xarelto without talking to the doctor who prescribed the medication.  Stopping without other stroke prevention medication to take the place of Xarelto may increase your risk of developing a clot that causes a stroke.  Refill your prescription before you run out.  After discharge, you should have regular check-up appointments with your healthcare provider that is prescribing your Xarelto.  In the future your dose may need to be changed if your kidney function or weight changes by a significant amount.  What do you do if you miss a dose? If you are taking Xarelto ONCE DAILY and you miss a dose, take it as soon as you remember on the same day then continue your regularly scheduled once daily regimen  the next day. Do not take two doses of Xarelto at the same time or on the same day.   Important Safety Information A possible side effect of Xarelto is bleeding. You should call your healthcare provider right away if you experience any of the following: ? Bleeding from an injury or your nose that does not stop. ? Unusual colored urine (red or dark brown) or unusual colored stools (red or black). ? Unusual bruising for unknown reasons. ? A serious fall or if you hit your head (even if there is no bleeding).  Some medicines may interact with Xarelto and might increase your risk of bleeding while on Xarelto. To help avoid this, consult your healthcare provider or pharmacist prior to using any new prescription or non-prescription medications, including herbals, vitamins, non-steroidal anti-inflammatory drugs (NSAIDs) and supplements.  This website has more information on Xarelto: VisitDestination.com.br.

## 2020-04-22 NOTE — Care Management (Addendum)
Benefit check sent for [name], provided with 30 day free coupon, patient verbalized instructions for use.

## 2020-04-22 NOTE — Care Management Obs Status (Signed)
MEDICARE OBSERVATION STATUS NOTIFICATION   Patient Details  Name: [name] MRN: [id number] Date of Birth: 1935-11-19   Medicare Observation Status Notification Given:  Yes    Lawerance Sabal, RN 04/22/2020, 11:30 AM

## 2020-04-26 DIAGNOSIS — G40919 Epilepsy, unspecified, intractable, without status epilepticus: Secondary | ICD-10-CM | POA: Diagnosis not present

## 2020-04-26 DIAGNOSIS — K219 Gastro-esophageal reflux disease without esophagitis: Secondary | ICD-10-CM | POA: Diagnosis not present

## 2020-04-26 DIAGNOSIS — N182 Chronic kidney disease, stage 2 (mild): Secondary | ICD-10-CM | POA: Diagnosis not present

## 2020-04-26 DIAGNOSIS — I201 Angina pectoris with documented spasm: Secondary | ICD-10-CM | POA: Diagnosis not present

## 2020-04-26 DIAGNOSIS — I5032 Chronic diastolic (congestive) heart failure: Secondary | ICD-10-CM | POA: Diagnosis not present

## 2020-04-26 DIAGNOSIS — E1121 Type 2 diabetes mellitus with diabetic nephropathy: Secondary | ICD-10-CM | POA: Diagnosis not present

## 2020-04-26 DIAGNOSIS — M48062 Spinal stenosis, lumbar region with neurogenic claudication: Secondary | ICD-10-CM | POA: Diagnosis not present

## 2020-04-26 DIAGNOSIS — Z0000 Encounter for general adult medical examination without abnormal findings: Secondary | ICD-10-CM | POA: Diagnosis not present

## 2020-04-26 DIAGNOSIS — E7849 Other hyperlipidemia: Secondary | ICD-10-CM | POA: Diagnosis not present

## 2020-04-26 DIAGNOSIS — E038 Other specified hypothyroidism: Secondary | ICD-10-CM | POA: Diagnosis not present

## 2020-04-30 DIAGNOSIS — W19XXXA Unspecified fall, initial encounter: Secondary | ICD-10-CM | POA: Diagnosis not present

## 2020-04-30 DIAGNOSIS — R69 Illness, unspecified: Secondary | ICD-10-CM | POA: Diagnosis not present

## 2020-04-30 DIAGNOSIS — R5381 Other malaise: Secondary | ICD-10-CM | POA: Diagnosis not present

## 2020-05-11 ENCOUNTER — Ambulatory Visit: Payer: Medicare HMO | Admitting: Cardiology

## 2020-05-11 ENCOUNTER — Other Ambulatory Visit: Payer: Self-pay

## 2020-05-11 ENCOUNTER — Encounter: Payer: Self-pay | Admitting: Cardiology

## 2020-05-11 VITALS — BP 118/78 | HR 84

## 2020-05-11 DIAGNOSIS — I25119 Atherosclerotic heart disease of native coronary artery with unspecified angina pectoris: Secondary | ICD-10-CM | POA: Diagnosis not present

## 2020-05-11 DIAGNOSIS — I495 Sick sinus syndrome: Secondary | ICD-10-CM

## 2020-05-11 DIAGNOSIS — I2699 Other pulmonary embolism without acute cor pulmonale: Secondary | ICD-10-CM

## 2020-05-11 NOTE — Patient Instructions (Addendum)

## 2020-05-11 NOTE — Progress Notes (Signed)
Cardiology Office Note  Date: 05/11/2020   ID: DENVIL CANNING, DOB Feb 09, 1935, MRN [id number]  PCP:  Neale Burly, MD  Cardiologist:  Rozann Lesches, MD Electrophysiologist:  Thompson Grayer, MD   Chief Complaint  Patient presents with  . Cardiac follow-up    History of Present Illness: [name] is an 85 y.o. male last assessed via telehealth encounter in January.  He is here today with his wife.  I reviewed interval records, he was hospitalized in June with chest pain. Follow-up cardiac catheterization at that time demonstrated moderate in-stent restenosis involving the stented segment of the posterior lateral artery, otherwise minimal nonobstructive CAD.  LVEF normal at 55 to 60% by echocardiogram.  Medical therapy was recommended.  He reports very limited ability to ambulate, chronically short of breath, weakness in his legs.  He uses a walker to limited degree at home.  Still has intermittent chest discomfort which is likely multifactorial in light of his recent cardiac catheterization.  He sees Dr. Rayann Heman, Medtronic pacemaker in place.  Device check in April indicated normal function.  He is on Xarelto with follow-up by Dr. Sherrie Sport, history of pulmonary emboli.  Recent echocardiogram showed only mildly elevated RVSP.  He had previously had more significant evidence of pulmonary hypertension.  Past Medical History:  Diagnosis Date  . Carotid sinus syndrome    Status post pacemaker  . Coronary atherosclerosis of native coronary artery    DES to PL 2006, mild to moderate residual disease 2010   . Essential hypertension   . Mixed hyperlipidemia   . Obstructive sleep apnea    CPAP  . Pacemaker 1986   Medtronic  . Pulmonary emboli [facility])    Diagnosed 2012 and 2014, IVC filter March 2014, recurrent March 5956 - thromboembolic therapy at [facility] and placed back on anticoagulation  . Pulmonary hypertension (HCC)    Severe, RVSP 85-90 mmHg March 2014   . Stroke ([place]) 11/2012   . Subdural hematoma [facility])    January 2014, occurred on Coumadin  . Type 2 diabetes mellitus ([place])     Past Surgical History:  Procedure Laterality Date  . Arm surgery Right   . CATARACT EXTRACTION W/PHACO Right 02/18/2014   Procedure: CATARACT EXTRACTION PHACO AND INTRAOCULAR LENS PLACEMENT (IOC);  Surgeon: Tonny Branch, MD;  Location: [facility];  Service: Ophthalmology;  Laterality: Right;  CDE: 11.83  . CATARACT EXTRACTION W/PHACO Left 03/25/2014   Procedure: CATARACT EXTRACTION PHACO AND INTRAOCULAR LENS PLACEMENT (IOC);  Surgeon: Tonny Branch, MD;  Location: [facility] ORS;  Service: Ophthalmology;  Laterality: Left;  CDE 15.93  . CHOLECYSTECTOMY  2011  . EP IMPLANTABLE DEVICE N/A 07/26/2015   MDT Adapta L gen change by Dr Rayann Heman  . FILTERING PROCEDURE  11/2012   placed due to blood clots from the lungs  . LEFT HEART CATH AND CORONARY ANGIOGRAPHY N/A 04/21/2020   Procedure: LEFT HEART CATH AND CORONARY ANGIOGRAPHY;  Surgeon: Lorretta Harp, MD;  Location: [place] CV LAB;  Service: Cardiovascular;  Laterality: N/A;  . PACEMAKER IMPLANTED   07/16/2005   Medtronic Kappa 900 DR    Current Outpatient Medications  Medication Sig Dispense Refill  . acetaminophen (TYLENOL) 325 MG tablet Take 2 tablets (650 mg total) by mouth every 4 (four) hours as needed for headache or mild pain.    Marland Kitchen allopurinol (ZYLOPRIM) 300 MG tablet Take 300 mg by mouth daily after breakfast.     . aspirin EC 81 MG tablet Take 81  mg by mouth daily after breakfast.    . atorvastatin (LIPITOR) 40 MG tablet Take 40 mg by mouth daily after breakfast.     . Cholecalciferol 25 MCG (1000 UT) capsule Take 1,000 Units by mouth daily.    . citalopram (CELEXA) 20 MG tablet Take 20 mg by mouth daily.  0  . colchicine 0.6 MG tablet Take 0.6 mg by mouth daily as needed (gout attacks).     . colestipol (COLESTID) 1 g tablet Take 1 g by mouth 2 (two) times daily.    . fenofibrate 160 MG tablet Take 160 mg by mouth daily.    . ferrous sulfate  325 (65 FE) MG tablet Take 325 mg by mouth 3 (three) times daily with meals.    . furosemide (LASIX) 20 MG tablet Take 20 mg by mouth daily.    Marland Kitchen gabapentin (NEURONTIN) 100 MG capsule Take 100 mg by mouth 3 (three) times daily.    Marland Kitchen levETIRAcetam (KEPPRA) 500 MG tablet Take 500 mg by mouth 2 (two) times daily.    Marland Kitchen levothyroxine (SYNTHROID) 100 MCG tablet Take 100 mcg by mouth daily.    . meclizine (ANTIVERT) 25 MG tablet Take 25 mg by mouth 3 (three) times daily as needed for dizziness.    . midodrine (PROAMATINE) 2.5 MG tablet Take 2.5 mg by mouth 3 (three) times daily with meals.    . Omega-3 Fatty Acids (FISH OIL) 1000 MG CAPS Take 1 capsule by mouth 3 (three) times daily.    Marland Kitchen omeprazole (PRILOSEC) 40 MG capsule Take 40 mg by mouth daily.      . ondansetron (ZOFRAN) 4 MG tablet Take 4 mg by mouth every 4 (four) hours as needed for nausea or vomiting.    . rivaroxaban (XARELTO) 20 MG TABS tablet Take 1 tablet (20 mg total) by mouth daily with supper. 30 tablet 6  . sitaGLIPtin (JANUVIA) 100 MG tablet Take 100 mg by mouth daily.    . vitamin B-12 (CYANOCOBALAMIN) 1000 MCG tablet Take 1,000 mcg by mouth daily.     No current facility-administered medications for this visit.   Allergies:  Nitroglycerin   ROS:  Hearing loss.  Physical Exam: VS:  BP 118/78   Pulse 84   SpO2 92% , BMI There is no height or weight on file to calculate BMI.  Wt Readings from Last 3 Encounters:  04/22/20 278 lb (126.1 kg)  11/27/19 271 lb (122.9 kg)  10/23/19 267 lb (121.1 kg)    General: Morbidly obese male, seated in wheelchair. HEENT: Conjunctiva and lids normal, wearing a mask. Neck: Supple, no elevated JVP or carotid bruits, no thyromegaly. Lungs: Clear to auscultation, nonlabored breathing at rest. Cardiac: Regular rate and rhythm, no S3 or significant systolic murmur, no pericardial rub. Abdomen: Protuberant, bowel sounds present. Extremities: Trace ankle edema, distal pulses 2+.  ECG:  An ECG  dated 04/20/2020 was personally reviewed today and demonstrated:  Sinus rhythm with right bundle branch block and left anterior fascicular block, PVCs.  Recent Labwork: 04/20/2020: Hemoglobin 15.3; Platelets 174 04/21/2020: TSH 11.428 04/22/2020: BUN 6; Creatinine, Ser 1.27; Potassium 3.4; Sodium 136     Component Value Date/Time   CHOL 117 04/21/2020 0644   TRIG 101 04/21/2020 0644   HDL 39 (L) 04/21/2020 0644   CHOLHDL 3.0 04/21/2020 0644   VLDL 20 04/21/2020 0644   LDLCALC 58 04/21/2020 0644    Other Studies Reviewed Today:  Cardiac catheterization 04/21/2020:  3rd Mrg lesion is 40% stenosed.  2nd LPL lesion is 50% stenosed. Mr. Beeks has noncritical CAD.  It was difficult to visualize his posterior lateral artery stent appears to potentially have 40 to 50% in-stent restenosis.  The remainder of his anatomy has minimal noncritical CAD and he has normal filling pressures.  I suspect his pain is noncardiac.  A right common femoral angiogram was performed by Mynx closure device was successfully deployed achieving hemostasis. The patient left lab in stable condition.  Echocardiogram 04/21/2020: 1. Left ventricular ejection fraction, by estimation, is 55 to 60%. The  left ventricle has normal function. The left ventricle has no regional  wall motion abnormalities. Left ventricular diastolic parameters are  consistent with Grade I diastolic  dysfunction (impaired relaxation).  2. Right ventricular systolic function is normal. The right ventricular  size is normal. There is mildly elevated pulmonary artery systolic  pressure.  3. The mitral valve is normal in structure. Trivial mitral valve  regurgitation. No evidence of mitral stenosis.  4. The aortic valve is normal in structure. Aortic valve regurgitation is  not visualized. No aortic stenosis is present.   Assessment and Plan:  1.  Recurrent chest pain, recent cardiac catheterization was overall reassuring.  He did have moderate  in-stent restenosis within the posterolateral artery, however nonobstructive and otherwise minor atherosclerosis in the remaining vessels.  LVEF also normal with only mild pulmonary hypertension.  Recommend continued medical therapy from a cardiac perspective.  Continue aspirin and Lipitor.  2.  History of recurrent pulmonary emboli and prior IVC filter.  He is on Xarelto at this point and continues to follow with Dr. Olena Leatherwood.  3.  Sick sinus syndrome, Medtronic pacemaker in place.  He follows with Dr. Johney Frame.  Medication Adjustments/Labs and Tests Ordered: Current medicines are reviewed at length with the patient today.  Concerns regarding medicines are outlined above.   Tests Ordered: No orders of the defined types were placed in this encounter.   Medication Changes: No orders of the defined types were placed in this encounter.   Disposition:  Follow up 6 months in the [place] office.  Signed, Jonelle Sidle, MD, [facility] 05/11/2020 2:19 PM    [facility] at [facility] [address] [place], [place], [place] [address] Phone: [phone]; Fax: [phone]

## 2020-05-13 ENCOUNTER — Ambulatory Visit: Payer: Medicare HMO | Admitting: Cardiology

## 2020-05-14 ENCOUNTER — Other Ambulatory Visit: Payer: Self-pay | Admitting: Cardiology

## 2020-05-24 ENCOUNTER — Ambulatory Visit (INDEPENDENT_AMBULATORY_CARE_PROVIDER_SITE_OTHER): Payer: Medicare HMO | Admitting: *Deleted

## 2020-05-24 DIAGNOSIS — I495 Sick sinus syndrome: Secondary | ICD-10-CM

## 2020-05-25 LAB — CUP PACEART REMOTE DEVICE CHECK
Battery Impedance: 279 Ohm
Battery Remaining Longevity: 118 mo
Battery Voltage: 2.79 V
Brady Statistic AP VP Percent: 3 %
Brady Statistic AP VS Percent: 17 %
Brady Statistic AS VP Percent: 0 %
Brady Statistic AS VS Percent: 80 %
Date Time Interrogation Session: 20210707092305
Implantable Lead Implant Date: 20060828
Implantable Lead Implant Date: 20060828
Implantable Lead Location: 753859
Implantable Lead Location: 753860
Implantable Lead Model: 5076
Implantable Lead Model: 5076
Implantable Pulse Generator Implant Date: 20160906
Lead Channel Impedance Value: 423 Ohm
Lead Channel Impedance Value: 503 Ohm
Lead Channel Pacing Threshold Amplitude: 1 V
Lead Channel Pacing Threshold Amplitude: 1.375 V
Lead Channel Pacing Threshold Pulse Width: 0.4 ms
Lead Channel Pacing Threshold Pulse Width: 0.4 ms
Lead Channel Setting Pacing Amplitude: 2 V
Lead Channel Setting Pacing Amplitude: 2.5 V
Lead Channel Setting Pacing Pulse Width: 0.64 ms
Lead Channel Setting Sensing Sensitivity: 2.8 mV

## 2020-05-26 DIAGNOSIS — W19XXXA Unspecified fall, initial encounter: Secondary | ICD-10-CM | POA: Diagnosis not present

## 2020-05-26 DIAGNOSIS — R404 Transient alteration of awareness: Secondary | ICD-10-CM | POA: Diagnosis not present

## 2020-05-26 NOTE — Progress Notes (Signed)
Remote pacemaker transmission.   

## 2020-06-19 DIAGNOSIS — I482 Chronic atrial fibrillation, unspecified: Secondary | ICD-10-CM | POA: Diagnosis not present

## 2020-06-19 DIAGNOSIS — M48061 Spinal stenosis, lumbar region without neurogenic claudication: Secondary | ICD-10-CM | POA: Diagnosis not present

## 2020-06-19 DIAGNOSIS — I5032 Chronic diastolic (congestive) heart failure: Secondary | ICD-10-CM | POA: Diagnosis not present

## 2020-06-19 DIAGNOSIS — E119 Type 2 diabetes mellitus without complications: Secondary | ICD-10-CM | POA: Diagnosis not present

## 2020-06-19 DIAGNOSIS — E039 Hypothyroidism, unspecified: Secondary | ICD-10-CM | POA: Diagnosis not present

## 2020-06-20 DIAGNOSIS — E039 Hypothyroidism, unspecified: Secondary | ICD-10-CM | POA: Diagnosis not present

## 2020-06-20 DIAGNOSIS — E119 Type 2 diabetes mellitus without complications: Secondary | ICD-10-CM | POA: Diagnosis not present

## 2020-06-20 DIAGNOSIS — M6281 Muscle weakness (generalized): Secondary | ICD-10-CM | POA: Diagnosis not present

## 2020-06-20 DIAGNOSIS — D508 Other iron deficiency anemias: Secondary | ICD-10-CM | POA: Diagnosis not present

## 2020-06-20 DIAGNOSIS — M48061 Spinal stenosis, lumbar region without neurogenic claudication: Secondary | ICD-10-CM | POA: Diagnosis not present

## 2020-06-20 DIAGNOSIS — R262 Difficulty in walking, not elsewhere classified: Secondary | ICD-10-CM | POA: Diagnosis not present

## 2020-06-20 DIAGNOSIS — E785 Hyperlipidemia, unspecified: Secondary | ICD-10-CM | POA: Diagnosis not present

## 2020-06-20 DIAGNOSIS — I509 Heart failure, unspecified: Secondary | ICD-10-CM | POA: Diagnosis not present

## 2020-06-20 DIAGNOSIS — I5032 Chronic diastolic (congestive) heart failure: Secondary | ICD-10-CM | POA: Diagnosis not present

## 2020-06-21 DIAGNOSIS — I509 Heart failure, unspecified: Secondary | ICD-10-CM | POA: Diagnosis not present

## 2020-06-21 DIAGNOSIS — R262 Difficulty in walking, not elsewhere classified: Secondary | ICD-10-CM | POA: Diagnosis not present

## 2020-06-21 DIAGNOSIS — E119 Type 2 diabetes mellitus without complications: Secondary | ICD-10-CM | POA: Diagnosis not present

## 2020-06-21 DIAGNOSIS — M6281 Muscle weakness (generalized): Secondary | ICD-10-CM | POA: Diagnosis not present

## 2020-06-22 DIAGNOSIS — D649 Anemia, unspecified: Secondary | ICD-10-CM | POA: Diagnosis not present

## 2020-06-22 DIAGNOSIS — E119 Type 2 diabetes mellitus without complications: Secondary | ICD-10-CM | POA: Diagnosis not present

## 2020-06-22 DIAGNOSIS — E559 Vitamin D deficiency, unspecified: Secondary | ICD-10-CM | POA: Diagnosis not present

## 2020-06-22 DIAGNOSIS — R262 Difficulty in walking, not elsewhere classified: Secondary | ICD-10-CM | POA: Diagnosis not present

## 2020-06-22 DIAGNOSIS — E039 Hypothyroidism, unspecified: Secondary | ICD-10-CM | POA: Diagnosis not present

## 2020-06-22 DIAGNOSIS — M6281 Muscle weakness (generalized): Secondary | ICD-10-CM | POA: Diagnosis not present

## 2020-06-22 DIAGNOSIS — I10 Essential (primary) hypertension: Secondary | ICD-10-CM | POA: Diagnosis not present

## 2020-06-22 DIAGNOSIS — I509 Heart failure, unspecified: Secondary | ICD-10-CM | POA: Diagnosis not present

## 2020-06-23 DIAGNOSIS — R262 Difficulty in walking, not elsewhere classified: Secondary | ICD-10-CM | POA: Diagnosis not present

## 2020-06-23 DIAGNOSIS — I509 Heart failure, unspecified: Secondary | ICD-10-CM | POA: Diagnosis not present

## 2020-06-23 DIAGNOSIS — E119 Type 2 diabetes mellitus without complications: Secondary | ICD-10-CM | POA: Diagnosis not present

## 2020-06-23 DIAGNOSIS — M6281 Muscle weakness (generalized): Secondary | ICD-10-CM | POA: Diagnosis not present

## 2020-06-25 DIAGNOSIS — R7989 Other specified abnormal findings of blood chemistry: Secondary | ICD-10-CM | POA: Diagnosis not present

## 2020-06-27 DIAGNOSIS — M48061 Spinal stenosis, lumbar region without neurogenic claudication: Secondary | ICD-10-CM | POA: Diagnosis not present

## 2020-06-27 DIAGNOSIS — D508 Other iron deficiency anemias: Secondary | ICD-10-CM | POA: Diagnosis not present

## 2020-06-27 DIAGNOSIS — E119 Type 2 diabetes mellitus without complications: Secondary | ICD-10-CM | POA: Diagnosis not present

## 2020-06-28 DIAGNOSIS — E119 Type 2 diabetes mellitus without complications: Secondary | ICD-10-CM | POA: Diagnosis not present

## 2020-06-28 DIAGNOSIS — I509 Heart failure, unspecified: Secondary | ICD-10-CM | POA: Diagnosis not present

## 2020-06-28 DIAGNOSIS — R262 Difficulty in walking, not elsewhere classified: Secondary | ICD-10-CM | POA: Diagnosis not present

## 2020-06-28 DIAGNOSIS — M6281 Muscle weakness (generalized): Secondary | ICD-10-CM | POA: Diagnosis not present

## 2020-06-29 DIAGNOSIS — M6281 Muscle weakness (generalized): Secondary | ICD-10-CM | POA: Diagnosis not present

## 2020-06-29 DIAGNOSIS — R262 Difficulty in walking, not elsewhere classified: Secondary | ICD-10-CM | POA: Diagnosis not present

## 2020-06-29 DIAGNOSIS — I509 Heart failure, unspecified: Secondary | ICD-10-CM | POA: Diagnosis not present

## 2020-06-29 DIAGNOSIS — E119 Type 2 diabetes mellitus without complications: Secondary | ICD-10-CM | POA: Diagnosis not present

## 2020-06-30 DIAGNOSIS — M6281 Muscle weakness (generalized): Secondary | ICD-10-CM | POA: Diagnosis not present

## 2020-06-30 DIAGNOSIS — I509 Heart failure, unspecified: Secondary | ICD-10-CM | POA: Diagnosis not present

## 2020-06-30 DIAGNOSIS — E119 Type 2 diabetes mellitus without complications: Secondary | ICD-10-CM | POA: Diagnosis not present

## 2020-06-30 DIAGNOSIS — R262 Difficulty in walking, not elsewhere classified: Secondary | ICD-10-CM | POA: Diagnosis not present

## 2020-07-01 DIAGNOSIS — E119 Type 2 diabetes mellitus without complications: Secondary | ICD-10-CM | POA: Diagnosis not present

## 2020-07-01 DIAGNOSIS — M6281 Muscle weakness (generalized): Secondary | ICD-10-CM | POA: Diagnosis not present

## 2020-07-01 DIAGNOSIS — I509 Heart failure, unspecified: Secondary | ICD-10-CM | POA: Diagnosis not present

## 2020-07-01 DIAGNOSIS — R262 Difficulty in walking, not elsewhere classified: Secondary | ICD-10-CM | POA: Diagnosis not present

## 2020-07-02 DIAGNOSIS — R262 Difficulty in walking, not elsewhere classified: Secondary | ICD-10-CM | POA: Diagnosis not present

## 2020-07-02 DIAGNOSIS — M6281 Muscle weakness (generalized): Secondary | ICD-10-CM | POA: Diagnosis not present

## 2020-07-02 DIAGNOSIS — E119 Type 2 diabetes mellitus without complications: Secondary | ICD-10-CM | POA: Diagnosis not present

## 2020-07-02 DIAGNOSIS — I509 Heart failure, unspecified: Secondary | ICD-10-CM | POA: Diagnosis not present

## 2020-07-04 DIAGNOSIS — D508 Other iron deficiency anemias: Secondary | ICD-10-CM | POA: Diagnosis not present

## 2020-07-04 DIAGNOSIS — R262 Difficulty in walking, not elsewhere classified: Secondary | ICD-10-CM | POA: Diagnosis not present

## 2020-07-04 DIAGNOSIS — M6281 Muscle weakness (generalized): Secondary | ICD-10-CM | POA: Diagnosis not present

## 2020-07-04 DIAGNOSIS — M48061 Spinal stenosis, lumbar region without neurogenic claudication: Secondary | ICD-10-CM | POA: Diagnosis not present

## 2020-07-04 DIAGNOSIS — I509 Heart failure, unspecified: Secondary | ICD-10-CM | POA: Diagnosis not present

## 2020-07-04 DIAGNOSIS — E119 Type 2 diabetes mellitus without complications: Secondary | ICD-10-CM | POA: Diagnosis not present

## 2020-07-05 DIAGNOSIS — R262 Difficulty in walking, not elsewhere classified: Secondary | ICD-10-CM | POA: Diagnosis not present

## 2020-07-05 DIAGNOSIS — I509 Heart failure, unspecified: Secondary | ICD-10-CM | POA: Diagnosis not present

## 2020-07-05 DIAGNOSIS — E119 Type 2 diabetes mellitus without complications: Secondary | ICD-10-CM | POA: Diagnosis not present

## 2020-07-05 DIAGNOSIS — M6281 Muscle weakness (generalized): Secondary | ICD-10-CM | POA: Diagnosis not present

## 2020-07-06 DIAGNOSIS — I509 Heart failure, unspecified: Secondary | ICD-10-CM | POA: Diagnosis not present

## 2020-07-06 DIAGNOSIS — R262 Difficulty in walking, not elsewhere classified: Secondary | ICD-10-CM | POA: Diagnosis not present

## 2020-07-06 DIAGNOSIS — M6281 Muscle weakness (generalized): Secondary | ICD-10-CM | POA: Diagnosis not present

## 2020-07-06 DIAGNOSIS — E119 Type 2 diabetes mellitus without complications: Secondary | ICD-10-CM | POA: Diagnosis not present

## 2020-07-07 DIAGNOSIS — I509 Heart failure, unspecified: Secondary | ICD-10-CM | POA: Diagnosis not present

## 2020-07-07 DIAGNOSIS — E119 Type 2 diabetes mellitus without complications: Secondary | ICD-10-CM | POA: Diagnosis not present

## 2020-07-07 DIAGNOSIS — M6281 Muscle weakness (generalized): Secondary | ICD-10-CM | POA: Diagnosis not present

## 2020-07-07 DIAGNOSIS — R262 Difficulty in walking, not elsewhere classified: Secondary | ICD-10-CM | POA: Diagnosis not present

## 2020-07-08 DIAGNOSIS — I509 Heart failure, unspecified: Secondary | ICD-10-CM | POA: Diagnosis not present

## 2020-07-08 DIAGNOSIS — M6281 Muscle weakness (generalized): Secondary | ICD-10-CM | POA: Diagnosis not present

## 2020-07-08 DIAGNOSIS — R262 Difficulty in walking, not elsewhere classified: Secondary | ICD-10-CM | POA: Diagnosis not present

## 2020-07-08 DIAGNOSIS — E119 Type 2 diabetes mellitus without complications: Secondary | ICD-10-CM | POA: Diagnosis not present

## 2020-07-11 DIAGNOSIS — R262 Difficulty in walking, not elsewhere classified: Secondary | ICD-10-CM | POA: Diagnosis not present

## 2020-07-11 DIAGNOSIS — M6281 Muscle weakness (generalized): Secondary | ICD-10-CM | POA: Diagnosis not present

## 2020-07-11 DIAGNOSIS — E119 Type 2 diabetes mellitus without complications: Secondary | ICD-10-CM | POA: Diagnosis not present

## 2020-07-11 DIAGNOSIS — E039 Hypothyroidism, unspecified: Secondary | ICD-10-CM | POA: Diagnosis not present

## 2020-07-11 DIAGNOSIS — M48061 Spinal stenosis, lumbar region without neurogenic claudication: Secondary | ICD-10-CM | POA: Diagnosis not present

## 2020-07-11 DIAGNOSIS — I5032 Chronic diastolic (congestive) heart failure: Secondary | ICD-10-CM | POA: Diagnosis not present

## 2020-07-11 DIAGNOSIS — I509 Heart failure, unspecified: Secondary | ICD-10-CM | POA: Diagnosis not present

## 2020-07-12 DIAGNOSIS — R262 Difficulty in walking, not elsewhere classified: Secondary | ICD-10-CM | POA: Diagnosis not present

## 2020-07-12 DIAGNOSIS — M6281 Muscle weakness (generalized): Secondary | ICD-10-CM | POA: Diagnosis not present

## 2020-07-12 DIAGNOSIS — E119 Type 2 diabetes mellitus without complications: Secondary | ICD-10-CM | POA: Diagnosis not present

## 2020-07-12 DIAGNOSIS — I509 Heart failure, unspecified: Secondary | ICD-10-CM | POA: Diagnosis not present

## 2020-07-13 DIAGNOSIS — R262 Difficulty in walking, not elsewhere classified: Secondary | ICD-10-CM | POA: Diagnosis not present

## 2020-07-13 DIAGNOSIS — I509 Heart failure, unspecified: Secondary | ICD-10-CM | POA: Diagnosis not present

## 2020-07-13 DIAGNOSIS — M6281 Muscle weakness (generalized): Secondary | ICD-10-CM | POA: Diagnosis not present

## 2020-07-13 DIAGNOSIS — E119 Type 2 diabetes mellitus without complications: Secondary | ICD-10-CM | POA: Diagnosis not present

## 2020-07-15 DIAGNOSIS — R262 Difficulty in walking, not elsewhere classified: Secondary | ICD-10-CM | POA: Diagnosis not present

## 2020-07-15 DIAGNOSIS — I509 Heart failure, unspecified: Secondary | ICD-10-CM | POA: Diagnosis not present

## 2020-07-15 DIAGNOSIS — E119 Type 2 diabetes mellitus without complications: Secondary | ICD-10-CM | POA: Diagnosis not present

## 2020-07-15 DIAGNOSIS — M6281 Muscle weakness (generalized): Secondary | ICD-10-CM | POA: Diagnosis not present

## 2020-07-18 DIAGNOSIS — M6281 Muscle weakness (generalized): Secondary | ICD-10-CM | POA: Diagnosis not present

## 2020-07-18 DIAGNOSIS — R262 Difficulty in walking, not elsewhere classified: Secondary | ICD-10-CM | POA: Diagnosis not present

## 2020-07-18 DIAGNOSIS — E119 Type 2 diabetes mellitus without complications: Secondary | ICD-10-CM | POA: Diagnosis not present

## 2020-07-18 DIAGNOSIS — I509 Heart failure, unspecified: Secondary | ICD-10-CM | POA: Diagnosis not present

## 2020-07-19 DIAGNOSIS — E119 Type 2 diabetes mellitus without complications: Secondary | ICD-10-CM | POA: Diagnosis not present

## 2020-07-19 DIAGNOSIS — I509 Heart failure, unspecified: Secondary | ICD-10-CM | POA: Diagnosis not present

## 2020-07-19 DIAGNOSIS — R262 Difficulty in walking, not elsewhere classified: Secondary | ICD-10-CM | POA: Diagnosis not present

## 2020-07-19 DIAGNOSIS — M6281 Muscle weakness (generalized): Secondary | ICD-10-CM | POA: Diagnosis not present

## 2020-08-22 ENCOUNTER — Ambulatory Visit (INDEPENDENT_AMBULATORY_CARE_PROVIDER_SITE_OTHER): Payer: Medicare Other

## 2020-08-22 DIAGNOSIS — I495 Sick sinus syndrome: Secondary | ICD-10-CM

## 2020-08-24 LAB — CUP PACEART REMOTE DEVICE CHECK
Battery Impedance: 279 Ohm
Battery Remaining Longevity: 118 mo
Battery Voltage: 2.79 V
Brady Statistic AP VP Percent: 2 %
Brady Statistic AP VS Percent: 16 %
Brady Statistic AS VP Percent: 0 %
Brady Statistic AS VS Percent: 81 %
Date Time Interrogation Session: 20211005143222
Implantable Lead Implant Date: 20060828
Implantable Lead Implant Date: 20060828
Implantable Lead Location: 753859
Implantable Lead Location: 753860
Implantable Lead Model: 5076
Implantable Lead Model: 5076
Implantable Pulse Generator Implant Date: 20160906
Lead Channel Impedance Value: 396 Ohm
Lead Channel Impedance Value: 452 Ohm
Lead Channel Pacing Threshold Amplitude: 0.875 V
Lead Channel Pacing Threshold Amplitude: 1.375 V
Lead Channel Pacing Threshold Pulse Width: 0.4 ms
Lead Channel Pacing Threshold Pulse Width: 0.4 ms
Lead Channel Setting Pacing Amplitude: 2 V
Lead Channel Setting Pacing Amplitude: 2.5 V
Lead Channel Setting Pacing Pulse Width: 0.64 ms
Lead Channel Setting Sensing Sensitivity: 2.8 mV

## 2020-08-25 NOTE — Progress Notes (Signed)
Remote pacemaker transmission.   

## 2020-10-21 ENCOUNTER — Encounter: Payer: Medicare HMO | Admitting: Internal Medicine

## 2020-11-04 ENCOUNTER — Encounter: Payer: Medicare Other | Admitting: Internal Medicine

## 2020-11-21 DIAGNOSIS — R262 Difficulty in walking, not elsewhere classified: Secondary | ICD-10-CM | POA: Diagnosis not present

## 2020-11-21 DIAGNOSIS — I509 Heart failure, unspecified: Secondary | ICD-10-CM | POA: Diagnosis not present

## 2020-11-21 DIAGNOSIS — M6281 Muscle weakness (generalized): Secondary | ICD-10-CM | POA: Diagnosis not present

## 2020-11-21 DIAGNOSIS — R1312 Dysphagia, oropharyngeal phase: Secondary | ICD-10-CM | POA: Diagnosis not present

## 2020-11-21 DIAGNOSIS — G629 Polyneuropathy, unspecified: Secondary | ICD-10-CM | POA: Diagnosis not present

## 2020-11-22 DIAGNOSIS — R262 Difficulty in walking, not elsewhere classified: Secondary | ICD-10-CM | POA: Diagnosis not present

## 2020-11-22 DIAGNOSIS — G629 Polyneuropathy, unspecified: Secondary | ICD-10-CM | POA: Diagnosis not present

## 2020-11-22 DIAGNOSIS — R1312 Dysphagia, oropharyngeal phase: Secondary | ICD-10-CM | POA: Diagnosis not present

## 2020-11-22 DIAGNOSIS — M6281 Muscle weakness (generalized): Secondary | ICD-10-CM | POA: Diagnosis not present

## 2020-11-22 DIAGNOSIS — I509 Heart failure, unspecified: Secondary | ICD-10-CM | POA: Diagnosis not present

## 2020-11-23 DIAGNOSIS — R262 Difficulty in walking, not elsewhere classified: Secondary | ICD-10-CM | POA: Diagnosis not present

## 2020-11-23 DIAGNOSIS — G629 Polyneuropathy, unspecified: Secondary | ICD-10-CM | POA: Diagnosis not present

## 2020-11-23 DIAGNOSIS — R569 Unspecified convulsions: Secondary | ICD-10-CM | POA: Diagnosis not present

## 2020-11-23 DIAGNOSIS — R1312 Dysphagia, oropharyngeal phase: Secondary | ICD-10-CM | POA: Diagnosis not present

## 2020-11-23 DIAGNOSIS — M6281 Muscle weakness (generalized): Secondary | ICD-10-CM | POA: Diagnosis not present

## 2020-11-23 DIAGNOSIS — I509 Heart failure, unspecified: Secondary | ICD-10-CM | POA: Diagnosis not present

## 2020-11-23 DIAGNOSIS — W57XXXA Bitten or stung by nonvenomous insect and other nonvenomous arthropods, initial encounter: Secondary | ICD-10-CM | POA: Diagnosis not present

## 2020-11-24 DIAGNOSIS — R262 Difficulty in walking, not elsewhere classified: Secondary | ICD-10-CM | POA: Diagnosis not present

## 2020-11-24 DIAGNOSIS — I509 Heart failure, unspecified: Secondary | ICD-10-CM | POA: Diagnosis not present

## 2020-11-24 DIAGNOSIS — R1312 Dysphagia, oropharyngeal phase: Secondary | ICD-10-CM | POA: Diagnosis not present

## 2020-11-24 DIAGNOSIS — G629 Polyneuropathy, unspecified: Secondary | ICD-10-CM | POA: Diagnosis not present

## 2020-11-24 DIAGNOSIS — M6281 Muscle weakness (generalized): Secondary | ICD-10-CM | POA: Diagnosis not present

## 2020-11-25 ENCOUNTER — Ambulatory Visit (INDEPENDENT_AMBULATORY_CARE_PROVIDER_SITE_OTHER): Payer: Medicare Other | Admitting: Internal Medicine

## 2020-11-25 ENCOUNTER — Encounter: Payer: Self-pay | Admitting: Internal Medicine

## 2020-11-25 VITALS — HR 88 | Ht 73.0 in

## 2020-11-25 DIAGNOSIS — R1312 Dysphagia, oropharyngeal phase: Secondary | ICD-10-CM | POA: Diagnosis not present

## 2020-11-25 DIAGNOSIS — M6281 Muscle weakness (generalized): Secondary | ICD-10-CM | POA: Diagnosis not present

## 2020-11-25 DIAGNOSIS — R262 Difficulty in walking, not elsewhere classified: Secondary | ICD-10-CM | POA: Diagnosis not present

## 2020-11-25 DIAGNOSIS — G629 Polyneuropathy, unspecified: Secondary | ICD-10-CM | POA: Diagnosis not present

## 2020-11-25 DIAGNOSIS — I471 Supraventricular tachycardia: Secondary | ICD-10-CM

## 2020-11-25 DIAGNOSIS — I495 Sick sinus syndrome: Secondary | ICD-10-CM

## 2020-11-25 DIAGNOSIS — I509 Heart failure, unspecified: Secondary | ICD-10-CM | POA: Diagnosis not present

## 2020-11-25 NOTE — Patient Instructions (Addendum)
Medication Instructions:   Your physician recommends that you continue on your current medications as directed. Please refer to the Current Medication list given to you today.  Labwork:  None  Testing/Procedures:  None  Follow-Up:  Your physician recommends that you schedule a follow-up appointment in: 1 year with Dr. Allred.  Any Other Special Instructions Will Be Listed Below (If Applicable).  If you need a refill on your cardiac medications before your next appointment, please call your pharmacy. 

## 2020-11-25 NOTE — Progress Notes (Signed)
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 male who presents today for routine electrophysiology followup.  Since last being seen in our clinic, the patient reports doing reasonably well.  He is not very active.  [facility].  Today, he denies symptoms of palpitations, chest pain, shortness of breath,  lower extremity edema, dizziness, presyncope, or syncope.  The patient is otherwise without complaint today.   Past Medical History:  Diagnosis Date  . Carotid sinus syndrome    Status post pacemaker  . Coronary atherosclerosis of native coronary artery    DES to PL 2006, mild to moderate residual disease 2010   . Essential hypertension   . Mixed hyperlipidemia   . Obstructive sleep apnea    CPAP  . Pacemaker 1986   Medtronic  . Pulmonary emboli [facility])    Diagnosed 2012 and 2014, IVC filter March 2014, recurrent March 2014 - throm[facility]embolic therapy at [facility] and placed back on anticoagulation  . Pulmonary hypertension (HCC)    Severe, RVSP 85-90 mmHg March 2014   . Stroke (HCC) 11/2012  . Subdural hematoma [facility])    January 2014, occurred on Coumadin  . Type 2 diabetes mellitus (HCC)    Past Surgical History:  Procedure Laterality Date  . Arm surgery Right   . CATARACT EXTRACTION W/PHACO Right 02/18/2014   Procedure: CATARACT EXTRACTION PHACO AND INTRAOCULAR LENS PLACEMENT (IOC);  Surgeon: Gemma Payor, MD;  Location: [facility];  Service: Ophthalmology;  Laterality: Right;  CDE: 11.83  . CATARACT EXTRACTION W/PHACO Left 03/25/2014   Procedure: CATARACT EXTRACTION PHACO AND INTRAOCULAR LENS PLACEMENT (IOC);  Surgeon: Gemma Payor, MD;  Location: [facility];  Service: Ophthalmology;  Laterality: Left;  CDE 15.93  . CHOLECYSTECTOMY  2011  . EP IMPLANTABLE DEVICE N/A 07/26/2015   MDT Adapta L gen change by Dr Johney Frame  . FILTERING PROCEDURE  11/2012   placed due to blood clots from the lungs  . LEFT HEART CATH  AND CORONARY ANGIOGRAPHY N/A 04/21/2020   Procedure: LEFT HEART CATH AND CORONARY ANGIOGRAPHY;  Surgeon: Runell Gess, MD;  [facility] INV[facility] LAB;  Service: Cardiovascular;  Laterality: N/A;  . PACEMAKER IMPLANTED   07/16/2005   Medtronic Kappa 900 DR    ROS- all systems are reviewed and negative except as per HPI above  Current Outpatient Medications  Medication Sig Dispense Refill  . acetaminophen (TYLENOL) 325 MG tablet Take 2 tablets (650 mg total) by mouth every 4 (four) hours as needed for headache or mild pain.    Marland Kitchen allopurinol (ZYLOPRIM) 300 MG tablet Take 300 mg by mouth daily after breakfast.     . aspirin EC 81 MG tablet Take 81 mg by mouth daily after breakfast.    . atorvastatin (LIPITOR) 40 MG tablet Take 40 mg by mouth daily after breakfast.     . Cholecalciferol 25 MCG (1000 UT) capsule Take 1,000 Units by mouth daily.    . citalopram (CELEXA) 20 MG tablet Take 20 mg by mouth daily.  0  . colestipol (COLESTID) 1 g tablet Take 1 g by mouth 2 (two) times daily.    . fenofibrate 160 MG tablet Take 160 mg by mouth daily.    . ferrous sulfate 325 (65 FE) MG tablet Take 325 mg by mouth 3 (three) times daily with meals.    . furosemide (LASIX) 20 MG tablet Take 20 mg by  mouth daily.    Marland Kitchen gabapentin (NEURONTIN) 100 MG capsule Take 100 mg by mouth 3 (three) times daily.    Marland Kitchen LANTUS SOLOSTAR 100 UNIT/ML Solostar Pen Inject 15 Units into the skin at bedtime.    . levETIRAcetam (KEPPRA) 500 MG tablet Take 500 mg by mouth 2 (two) times daily.    Marland Kitchen levothyroxine (SYNTHROID) 200 MCG tablet Take 1 tablet by mouth daily at 6 (six) AM.    . midodrine (PROAMATINE) 2.5 MG tablet Take 2.5 mg by mouth 3 (three) times daily with meals.    . pantoprazole (PROTONIX) 40 MG tablet Take 40 mg by mouth daily.    . rivaroxaban (XARELTO) 20 MG TABS tablet Take 1 tablet (20 mg total) by mouth daily with supper. 30 tablet 6  . sitaGLIPtin (JANUVIA) 100 MG tablet Take 100 mg by mouth daily.    .  vitamin B-12 (CYANOCOBALAMIN) 1000 MCG tablet Take 1,000 mcg by mouth daily.     No current facility-administered medications for this visit.    Physical Exam: Vitals:   11/25/20 1210  Pulse: 88  SpO2: 92%  Height: 6\' 1"  (1.854 m)    GEN- The patient is chronically ill and obese appearing, alert and oriented x 3 today.   Head- normocephalic, atraumatic Eyes-  Sclera clear, conjunctiva pink Ears- hearing intact Oropharynx- clear Lungs-   normal work of breathing Chest- pacemaker pocket is well healed Heart- Regular rate and rhythm  GI- soft  Extremities- no clubbing, cyanosis, + edema  Pacemaker interrogation- reviewed in detail today,  See PACEART report   Assessment and Plan:  1. Symptomatic sinus bradycardia  Normal pacemaker function See Pace Art report No changes today he is not device dependant today He has known RV lead noise.  Likely myopotention with unipolar sensing.  2. HTN Stable No change required today  3. Prior PTE Continue long term anticoagulation S/p IVC filter  4. Atach/ SVT Well controlled  Risks, benefits and potential toxicities for medications prescribed and/or refilled reviewed with patient today.   Return in a year  MD, [facility] 11/25/2020 1:00 PM

## 2020-11-28 DIAGNOSIS — I509 Heart failure, unspecified: Secondary | ICD-10-CM | POA: Diagnosis not present

## 2020-11-28 DIAGNOSIS — M6281 Muscle weakness (generalized): Secondary | ICD-10-CM | POA: Diagnosis not present

## 2020-11-28 DIAGNOSIS — G629 Polyneuropathy, unspecified: Secondary | ICD-10-CM | POA: Diagnosis not present

## 2020-11-28 DIAGNOSIS — R1312 Dysphagia, oropharyngeal phase: Secondary | ICD-10-CM | POA: Diagnosis not present

## 2020-11-28 DIAGNOSIS — R262 Difficulty in walking, not elsewhere classified: Secondary | ICD-10-CM | POA: Diagnosis not present

## 2020-11-29 DIAGNOSIS — G629 Polyneuropathy, unspecified: Secondary | ICD-10-CM | POA: Diagnosis not present

## 2020-11-29 DIAGNOSIS — R262 Difficulty in walking, not elsewhere classified: Secondary | ICD-10-CM | POA: Diagnosis not present

## 2020-11-29 DIAGNOSIS — R1312 Dysphagia, oropharyngeal phase: Secondary | ICD-10-CM | POA: Diagnosis not present

## 2020-11-29 DIAGNOSIS — M6281 Muscle weakness (generalized): Secondary | ICD-10-CM | POA: Diagnosis not present

## 2020-11-29 DIAGNOSIS — I509 Heart failure, unspecified: Secondary | ICD-10-CM | POA: Diagnosis not present

## 2020-11-30 DIAGNOSIS — M6281 Muscle weakness (generalized): Secondary | ICD-10-CM | POA: Diagnosis not present

## 2020-11-30 DIAGNOSIS — I509 Heart failure, unspecified: Secondary | ICD-10-CM | POA: Diagnosis not present

## 2020-11-30 DIAGNOSIS — R262 Difficulty in walking, not elsewhere classified: Secondary | ICD-10-CM | POA: Diagnosis not present

## 2020-11-30 DIAGNOSIS — R1312 Dysphagia, oropharyngeal phase: Secondary | ICD-10-CM | POA: Diagnosis not present

## 2020-11-30 DIAGNOSIS — G629 Polyneuropathy, unspecified: Secondary | ICD-10-CM | POA: Diagnosis not present

## 2020-12-01 DIAGNOSIS — I509 Heart failure, unspecified: Secondary | ICD-10-CM | POA: Diagnosis not present

## 2020-12-01 DIAGNOSIS — R262 Difficulty in walking, not elsewhere classified: Secondary | ICD-10-CM | POA: Diagnosis not present

## 2020-12-01 DIAGNOSIS — R1312 Dysphagia, oropharyngeal phase: Secondary | ICD-10-CM | POA: Diagnosis not present

## 2020-12-01 DIAGNOSIS — G629 Polyneuropathy, unspecified: Secondary | ICD-10-CM | POA: Diagnosis not present

## 2020-12-01 DIAGNOSIS — M6281 Muscle weakness (generalized): Secondary | ICD-10-CM | POA: Diagnosis not present

## 2020-12-02 DIAGNOSIS — R1312 Dysphagia, oropharyngeal phase: Secondary | ICD-10-CM | POA: Diagnosis not present

## 2020-12-02 DIAGNOSIS — G629 Polyneuropathy, unspecified: Secondary | ICD-10-CM | POA: Diagnosis not present

## 2020-12-02 DIAGNOSIS — R262 Difficulty in walking, not elsewhere classified: Secondary | ICD-10-CM | POA: Diagnosis not present

## 2020-12-02 DIAGNOSIS — M6281 Muscle weakness (generalized): Secondary | ICD-10-CM | POA: Diagnosis not present

## 2020-12-02 DIAGNOSIS — I509 Heart failure, unspecified: Secondary | ICD-10-CM | POA: Diagnosis not present

## 2020-12-05 DIAGNOSIS — R262 Difficulty in walking, not elsewhere classified: Secondary | ICD-10-CM | POA: Diagnosis not present

## 2020-12-05 DIAGNOSIS — I509 Heart failure, unspecified: Secondary | ICD-10-CM | POA: Diagnosis not present

## 2020-12-05 DIAGNOSIS — M6281 Muscle weakness (generalized): Secondary | ICD-10-CM | POA: Diagnosis not present

## 2020-12-05 DIAGNOSIS — R1312 Dysphagia, oropharyngeal phase: Secondary | ICD-10-CM | POA: Diagnosis not present

## 2020-12-05 DIAGNOSIS — G629 Polyneuropathy, unspecified: Secondary | ICD-10-CM | POA: Diagnosis not present

## 2020-12-06 DIAGNOSIS — M6281 Muscle weakness (generalized): Secondary | ICD-10-CM | POA: Diagnosis not present

## 2020-12-06 DIAGNOSIS — I509 Heart failure, unspecified: Secondary | ICD-10-CM | POA: Diagnosis not present

## 2020-12-06 DIAGNOSIS — R262 Difficulty in walking, not elsewhere classified: Secondary | ICD-10-CM | POA: Diagnosis not present

## 2020-12-06 DIAGNOSIS — R1312 Dysphagia, oropharyngeal phase: Secondary | ICD-10-CM | POA: Diagnosis not present

## 2020-12-06 DIAGNOSIS — G629 Polyneuropathy, unspecified: Secondary | ICD-10-CM | POA: Diagnosis not present

## 2020-12-07 DIAGNOSIS — I509 Heart failure, unspecified: Secondary | ICD-10-CM | POA: Diagnosis not present

## 2020-12-07 DIAGNOSIS — R262 Difficulty in walking, not elsewhere classified: Secondary | ICD-10-CM | POA: Diagnosis not present

## 2020-12-07 DIAGNOSIS — R1312 Dysphagia, oropharyngeal phase: Secondary | ICD-10-CM | POA: Diagnosis not present

## 2020-12-07 DIAGNOSIS — M6281 Muscle weakness (generalized): Secondary | ICD-10-CM | POA: Diagnosis not present

## 2020-12-07 DIAGNOSIS — G629 Polyneuropathy, unspecified: Secondary | ICD-10-CM | POA: Diagnosis not present

## 2020-12-08 DIAGNOSIS — I509 Heart failure, unspecified: Secondary | ICD-10-CM | POA: Diagnosis not present

## 2020-12-08 DIAGNOSIS — G629 Polyneuropathy, unspecified: Secondary | ICD-10-CM | POA: Diagnosis not present

## 2020-12-08 DIAGNOSIS — M6281 Muscle weakness (generalized): Secondary | ICD-10-CM | POA: Diagnosis not present

## 2020-12-08 DIAGNOSIS — R1312 Dysphagia, oropharyngeal phase: Secondary | ICD-10-CM | POA: Diagnosis not present

## 2020-12-08 DIAGNOSIS — R262 Difficulty in walking, not elsewhere classified: Secondary | ICD-10-CM | POA: Diagnosis not present

## 2020-12-09 DIAGNOSIS — R262 Difficulty in walking, not elsewhere classified: Secondary | ICD-10-CM | POA: Diagnosis not present

## 2020-12-09 DIAGNOSIS — M6281 Muscle weakness (generalized): Secondary | ICD-10-CM | POA: Diagnosis not present

## 2020-12-09 DIAGNOSIS — R1312 Dysphagia, oropharyngeal phase: Secondary | ICD-10-CM | POA: Diagnosis not present

## 2020-12-09 DIAGNOSIS — I509 Heart failure, unspecified: Secondary | ICD-10-CM | POA: Diagnosis not present

## 2020-12-09 DIAGNOSIS — G629 Polyneuropathy, unspecified: Secondary | ICD-10-CM | POA: Diagnosis not present

## 2020-12-12 DIAGNOSIS — R262 Difficulty in walking, not elsewhere classified: Secondary | ICD-10-CM | POA: Diagnosis not present

## 2020-12-12 DIAGNOSIS — M6281 Muscle weakness (generalized): Secondary | ICD-10-CM | POA: Diagnosis not present

## 2020-12-12 DIAGNOSIS — I509 Heart failure, unspecified: Secondary | ICD-10-CM | POA: Diagnosis not present

## 2020-12-12 DIAGNOSIS — R1312 Dysphagia, oropharyngeal phase: Secondary | ICD-10-CM | POA: Diagnosis not present

## 2020-12-12 DIAGNOSIS — G629 Polyneuropathy, unspecified: Secondary | ICD-10-CM | POA: Diagnosis not present

## 2020-12-14 DIAGNOSIS — L603 Nail dystrophy: Secondary | ICD-10-CM | POA: Diagnosis not present

## 2020-12-14 DIAGNOSIS — B351 Tinea unguium: Secondary | ICD-10-CM | POA: Diagnosis not present

## 2020-12-14 DIAGNOSIS — E1159 Type 2 diabetes mellitus with other circulatory complications: Secondary | ICD-10-CM | POA: Diagnosis not present

## 2021-08-17 ENCOUNTER — Other Ambulatory Visit (HOSPITAL_COMMUNITY): Payer: Self-pay | Admitting: Neurology

## 2021-08-17 DIAGNOSIS — M545 Low back pain, unspecified: Secondary | ICD-10-CM

## 2021-11-24 ENCOUNTER — Encounter: Payer: Self-pay | Admitting: Internal Medicine

## 2021-11-24 ENCOUNTER — Ambulatory Visit (INDEPENDENT_AMBULATORY_CARE_PROVIDER_SITE_OTHER): Payer: Medicare Other | Admitting: Internal Medicine

## 2021-11-24 VITALS — BP 123/89 | HR 85 | Ht 73.0 in | Wt 264.0 lb

## 2021-11-24 DIAGNOSIS — I495 Sick sinus syndrome: Secondary | ICD-10-CM | POA: Diagnosis not present

## 2021-11-24 DIAGNOSIS — I10 Essential (primary) hypertension: Secondary | ICD-10-CM | POA: Diagnosis not present

## 2021-11-24 NOTE — Patient Instructions (Signed)
Medication Instructions:  Continue all current medications.  Labwork: none  Testing/Procedures: none  Follow-Up: 1 year - Dr.  Allred   Any Other Special Instructions Will Be Listed Below (If Applicable).   If you need a refill on your cardiac medications before your next appointment, please call your pharmacy.  

## 2021-11-24 NOTE — Progress Notes (Signed)
PCP: Sherol DadeSimpson-Tarokh, Leann, DO Primary Cardiologist: Dr Diona BrownerMcDowell Primary EP:  Dr Trudi IdaAllred  [name] is a 87 y.o. male who presents today for routine electrophysiology followup.  Since last being seen in our clinic, the patient reports doing very well.  Today, he denies symptoms of palpitations, chest pain, shortness of breath,  lower extremity edema, dizziness, presyncope, or syncope.  The patient is otherwise without complaint today.   Past Medical History:  Diagnosis Date   Carotid sinus syndrome    Status post pacemaker   Coronary atherosclerosis of native coronary artery    DES to PL 2006, mild to moderate residual disease 2010    Essential hypertension    Mixed hyperlipidemia    Obstructive sleep apnea    CPAP   Pacemaker 1986   Medtronic   Pulmonary emboli (HCC)    Diagnosed 2012 and 2014, IVC filter March 2014, recurrent March 2014 - [facility] and placed back on anticoagulation   Pulmonary hypertension (HCC)    Severe, RVSP 85-90 mmHg March 2014    Stroke [facility](HCC) 11/2012   Subdural hematoma    January 2014, occurred on Coumadin   Type 2 diabetes mellitus [facility](HCC)    Past Surgical History:  Procedure Laterality Date   Arm surgery Right    CATARACT EXTRACTION W/PHACO Right 02/18/2014   Procedure: CATARACT EXTRACTION PHACO AND INTRAOCULAR LENS PLACEMENT (IOC);  Surgeon: Gemma PayorKerry Hunt, MD;  Location: [facility];  Service: Ophthalmology;  Laterality: Right;  CDE: 11.83   CATARACT EXTRACTION W/PHACO Left 03/25/2014   Procedure: CATARACT EXTRACTION PHACO AND INTRAOCULAR LENS PLACEMENT (IOC);  Surgeon: Gemma PayorKerry Hunt, MD;  Location: [facility];  Service: Ophthalmology;  Laterality: Left;  CDE 15.93   CHOLECYSTECTOMY  2011   EP IMPLANTABLE DEVICE N/A 07/26/2015   MDT Adapta L gen change by Dr Johney FrameAllred   FILTERING PROCEDURE  11/2012   placed due to blood clots from the lungs   LEFT HEART CATH AND CORONARY ANGIOGRAPHY N/A 04/21/2020   Procedure: LEFT HEART CATH AND CORONARY  ANGIOGRAPHY;  Surgeon: Runell GessBerry, Jonathan J, MD;  [facility];  Service: Cardiovascular;  Laterality: N/A;   PACEMAKER IMPLANTED   07/16/2005   Medtronic Kappa 900 DR    ROS- all systems are reviewed and negative except as per HPI above  Current Outpatient Medications  Medication Sig Dispense Refill   acetaminophen (TYLENOL) 325 MG tablet Take 2 tablets (650 mg total) by mouth every 4 (four) hours as needed for headache or mild pain.     allopurinol (ZYLOPRIM) 300 MG tablet Take 300 mg by mouth daily after breakfast.      aspirin EC 81 MG tablet Take 81 mg by mouth daily after breakfast.     atorvastatin (LIPITOR) 40 MG tablet Take 40 mg by mouth daily after breakfast.      Cholecalciferol 25 MCG (1000 UT) capsule Take 1,000 Units by mouth daily.     citalopram (CELEXA) 20 MG tablet Take 20 mg by mouth daily.  0   colestipol (COLESTID) 1 g tablet Take 1 g by mouth 2 (two) times daily.     fenofibrate 160 MG tablet Take 160 mg by mouth daily.     ferrous sulfate 325 (65 FE) MG tablet Take 325 mg by mouth 3 (three) times daily with meals.     furosemide (LASIX) 20 MG tablet Take 20 mg by mouth daily.     gabapentin (NEURONTIN) 300 MG capsule Take 100 mg  by mouth 2 (two) times daily.     LANTUS SOLOSTAR 100 UNIT/ML Solostar Pen Inject 15 Units into the skin at bedtime.     levETIRAcetam (KEPPRA) 500 MG tablet Take 500 mg by mouth 2 (two) times daily.     Levothyroxine Sodium 137 MCG CAPS Take 1 tablet by mouth daily at 6 (six) AM.     metFORMIN (GLUCOPHAGE) 500 MG tablet Take 500 mg by mouth daily with breakfast.     midodrine (PROAMATINE) 2.5 MG tablet Take 2.5 mg by mouth 3 (three) times daily with meals.     pantoprazole (PROTONIX) 40 MG tablet Take 40 mg by mouth daily.     rivaroxaban (XARELTO) 20 MG TABS tablet Take 1 tablet (20 mg total) by mouth daily with supper. 30 tablet 6   sitaGLIPtin (JANUVIA) 100 MG tablet Take 100 mg by mouth daily.     vitamin B-12  (CYANOCOBALAMIN) 1000 MCG tablet Take 1,000 mcg by mouth daily.     No current facility-administered medications for this visit.    Physical Exam: Vitals:   11/24/21 1048  BP: 123/89  Pulse: 85  Weight: 264 lb (119.7 kg)  Height: 6\' 1"  (1.854 m)    GEN- The patient is well appearing, alert and oriented x 3 today.  In a wheelchair Head- normocephalic, atraumatic Eyes-  Sclera clear, conjunctiva pink Ears- hearing intact Oropharynx- clear Lungs- Clear to ausculation bilaterally, normal work of breathing Chest- pacemaker pocket is well healed Heart- Regular rate and rhythm, no murmurs, rubs or gallops, PMI not laterally displaced GI- soft, NT, ND, + BS Extremities- no clubbing, cyanosis, + dependant edema  Pacemaker interrogation- reviewed in detail today,  See PACEART report  ekg tracing ordered today is personally reviewed and shows sinus, RBBB  Assessment and Plan:  1. Symptomatic sinus bradycardia   Normal pacemaker function See Pace Art report No changes today he is not device dependant today He has known RV lead noise.  Likely myopotention with unipolar sensing.  2. HTN Stable No change required today  3. Prior PTE S/p IVC filter Continue long term [place]    Return in a year  Thompson Grayer MD, [facility] 11/24/2021 11:02 AM

## 2022-02-08 ENCOUNTER — Telehealth: Payer: Self-pay | Admitting: Internal Medicine

## 2022-02-08 NOTE — Telephone Encounter (Signed)
Patient's son is wondering if we can fill our FLMA forms for him. So if his father is in the hospital again he can be out with his father.   ?

## 2022-02-12 NOTE — Telephone Encounter (Signed)
Coady Train called to check on status of FLMA forms being filled out of him.  He states it needs to be filled out by 02/15/22.  He has used up all of his vacation time already.  He wants the FLMA forms filled out for if something happens to his father he can be out from work.

## 2022-05-30 ENCOUNTER — Ambulatory Visit (INDEPENDENT_AMBULATORY_CARE_PROVIDER_SITE_OTHER): Payer: Medicare Other

## 2022-05-30 DIAGNOSIS — I495 Sick sinus syndrome: Secondary | ICD-10-CM

## 2022-05-30 LAB — CUP PACEART REMOTE DEVICE CHECK
Battery Impedance: 499 Ohm
Battery Remaining Longevity: 99 mo
Battery Voltage: 2.79 V
Brady Statistic AP VP Percent: 1 %
Brady Statistic AP VS Percent: 6 %
Brady Statistic AS VP Percent: 0 %
Brady Statistic AS VS Percent: 93 %
Date Time Interrogation Session: 20230711200141
Implantable Lead Implant Date: 20060828
Implantable Lead Implant Date: 20060828
Implantable Lead Location: 753859
Implantable Lead Location: 753860
Implantable Lead Model: 5076
Implantable Lead Model: 5076
Implantable Pulse Generator Implant Date: 20160906
Lead Channel Impedance Value: 412 Ohm
Lead Channel Impedance Value: 590 Ohm
Lead Channel Pacing Threshold Amplitude: 0.75 V
Lead Channel Pacing Threshold Amplitude: 1.125 V
Lead Channel Pacing Threshold Pulse Width: 0.4 ms
Lead Channel Pacing Threshold Pulse Width: 0.4 ms
Lead Channel Setting Pacing Amplitude: 2 V
Lead Channel Setting Pacing Amplitude: 2.5 V
Lead Channel Setting Pacing Pulse Width: 0.64 ms
Lead Channel Setting Sensing Sensitivity: 2.8 mV

## 2022-06-19 NOTE — Progress Notes (Signed)
Remote pacemaker transmission.   

## 2022-08-29 ENCOUNTER — Ambulatory Visit (INDEPENDENT_AMBULATORY_CARE_PROVIDER_SITE_OTHER): Payer: Medicare Other

## 2022-08-29 DIAGNOSIS — I495 Sick sinus syndrome: Secondary | ICD-10-CM | POA: Diagnosis not present

## 2022-08-30 LAB — CUP PACEART REMOTE DEVICE CHECK
Battery Impedance: 601 Ohm
Battery Remaining Longevity: 90 mo
Battery Voltage: 2.79 V
Brady Statistic AP VP Percent: 2 %
Brady Statistic AP VS Percent: 6 %
Brady Statistic AS VP Percent: 0 %
Brady Statistic AS VS Percent: 92 %
Date Time Interrogation Session: 20231012110702
Implantable Lead Implant Date: 20060828
Implantable Lead Implant Date: 20060828
Implantable Lead Location: 753859
Implantable Lead Location: 753860
Implantable Lead Model: 5076
Implantable Lead Model: 5076
Implantable Pulse Generator Implant Date: 20160906
Lead Channel Impedance Value: 406 Ohm
Lead Channel Impedance Value: 582 Ohm
Lead Channel Pacing Threshold Amplitude: 0.875 V
Lead Channel Pacing Threshold Amplitude: 1.5 V
Lead Channel Pacing Threshold Pulse Width: 0.4 ms
Lead Channel Pacing Threshold Pulse Width: 0.4 ms
Lead Channel Setting Pacing Amplitude: 2.25 V
Lead Channel Setting Pacing Amplitude: 2.5 V
Lead Channel Setting Pacing Pulse Width: 0.64 ms
Lead Channel Setting Sensing Sensitivity: 2.8 mV

## 2022-09-12 NOTE — Progress Notes (Signed)
Remote pacemaker transmission.   

## 2022-11-23 ENCOUNTER — Encounter: Payer: Medicare Other | Admitting: Internal Medicine

## 2022-11-28 ENCOUNTER — Ambulatory Visit (INDEPENDENT_AMBULATORY_CARE_PROVIDER_SITE_OTHER): Payer: Medicare Other

## 2022-11-28 DIAGNOSIS — I495 Sick sinus syndrome: Secondary | ICD-10-CM

## 2022-11-29 NOTE — Telephone Encounter (Signed)
Erroneous encounter

## 2022-12-04 LAB — CUP PACEART REMOTE DEVICE CHECK
Battery Impedance: 624 Ohm
Battery Remaining Longevity: 89 mo
Battery Voltage: 2.78 V
Brady Statistic AP VP Percent: 2 %
Brady Statistic AP VS Percent: 6 %
Brady Statistic AS VP Percent: 0 %
Brady Statistic AS VS Percent: 92 %
Date Time Interrogation Session: 20240115152932
Implantable Lead Connection Status: 753985
Implantable Lead Connection Status: 753985
Implantable Lead Implant Date: 20060828
Implantable Lead Implant Date: 20060828
Implantable Lead Location: 753859
Implantable Lead Location: 753860
Implantable Lead Model: 5076
Implantable Lead Model: 5076
Implantable Pulse Generator Implant Date: 20160906
Lead Channel Impedance Value: 391 Ohm
Lead Channel Impedance Value: 571 Ohm
Lead Channel Pacing Threshold Amplitude: 0.875 V
Lead Channel Pacing Threshold Amplitude: 1.375 V
Lead Channel Pacing Threshold Pulse Width: 0.4 ms
Lead Channel Pacing Threshold Pulse Width: 0.4 ms
Lead Channel Setting Pacing Amplitude: 2 V
Lead Channel Setting Pacing Amplitude: 2.5 V
Lead Channel Setting Pacing Pulse Width: 0.64 ms
Lead Channel Setting Sensing Sensitivity: 2.8 mV
Zone Setting Status: 755011
Zone Setting Status: 755011

## 2022-12-07 ENCOUNTER — Ambulatory Visit: Payer: Medicare Other | Attending: Internal Medicine | Admitting: Cardiovascular Disease

## 2022-12-20 NOTE — Progress Notes (Signed)
Remote pacemaker transmission.   

## 2023-04-16 ENCOUNTER — Ambulatory Visit (INDEPENDENT_AMBULATORY_CARE_PROVIDER_SITE_OTHER): Payer: Medicare Other

## 2023-04-16 DIAGNOSIS — I495 Sick sinus syndrome: Secondary | ICD-10-CM

## 2023-04-17 LAB — CUP PACEART REMOTE DEVICE CHECK
Battery Impedance: 649 Ohm
Battery Remaining Longevity: 87 mo
Battery Voltage: 2.78 V
Brady Statistic AP VP Percent: 2 %
Brady Statistic AP VS Percent: 6 %
Brady Statistic AS VP Percent: 0 %
Brady Statistic AS VS Percent: 92 %
Date Time Interrogation Session: 20240528170224
Implantable Lead Connection Status: 753985
Implantable Lead Connection Status: 753985
Implantable Lead Implant Date: 20060828
Implantable Lead Implant Date: 20060828
Implantable Lead Location: 753859
Implantable Lead Location: 753860
Implantable Lead Model: 5076
Implantable Lead Model: 5076
Implantable Pulse Generator Implant Date: 20160906
Lead Channel Impedance Value: 391 Ohm
Lead Channel Impedance Value: 582 Ohm
Lead Channel Pacing Threshold Amplitude: 1 V
Lead Channel Pacing Threshold Amplitude: 1.5 V
Lead Channel Pacing Threshold Pulse Width: 0.4 ms
Lead Channel Pacing Threshold Pulse Width: 0.4 ms
Lead Channel Setting Pacing Amplitude: 2 V
Lead Channel Setting Pacing Amplitude: 2.5 V
Lead Channel Setting Pacing Pulse Width: 0.64 ms
Lead Channel Setting Sensing Sensitivity: 2.8 mV
Zone Setting Status: 755011
Zone Setting Status: 755011

## 2023-05-13 NOTE — Progress Notes (Signed)
Remote pacemaker transmission.   

## 2023-08-23 ENCOUNTER — Ambulatory Visit: Payer: Medicare Other | Attending: Internal Medicine | Admitting: Cardiovascular Disease

## 2023-08-23 ENCOUNTER — Encounter: Payer: Self-pay | Admitting: Cardiovascular Disease

## 2023-08-23 VITALS — BP 122/70 | HR 86 | Ht 73.0 in | Wt 264.0 lb

## 2023-08-23 DIAGNOSIS — I10 Essential (primary) hypertension: Secondary | ICD-10-CM | POA: Diagnosis not present

## 2023-08-23 DIAGNOSIS — I495 Sick sinus syndrome: Secondary | ICD-10-CM

## 2023-08-23 LAB — CUP PACEART INCLINIC DEVICE CHECK
Battery Impedance: 829 Ohm
Battery Remaining Longevity: 77 mo
Battery Voltage: 2.77 V
Brady Statistic AP VP Percent: 2 %
Brady Statistic AP VS Percent: 7 %
Brady Statistic AS VP Percent: 1 %
Brady Statistic AS VS Percent: 91 %
Date Time Interrogation Session: 20241004163235
Implantable Lead Connection Status: 753985
Implantable Lead Connection Status: 753985
Implantable Lead Implant Date: 20060828
Implantable Lead Implant Date: 20060828
Implantable Lead Location: 753859
Implantable Lead Location: 753860
Implantable Lead Model: 5076
Implantable Lead Model: 5076
Implantable Pulse Generator Implant Date: 20160906
Lead Channel Impedance Value: 419 Ohm
Lead Channel Impedance Value: 612 Ohm
Lead Channel Pacing Threshold Amplitude: 0.875 V
Lead Channel Pacing Threshold Amplitude: 1 V
Lead Channel Pacing Threshold Amplitude: 1.625 V
Lead Channel Pacing Threshold Pulse Width: 0.4 ms
Lead Channel Pacing Threshold Pulse Width: 0.4 ms
Lead Channel Pacing Threshold Pulse Width: 0.64 ms
Lead Channel Sensing Intrinsic Amplitude: 4 mV
Lead Channel Sensing Intrinsic Amplitude: 8 mV
Lead Channel Setting Pacing Amplitude: 2 V
Lead Channel Setting Pacing Amplitude: 2.5 V
Lead Channel Setting Pacing Pulse Width: 0.4 ms
Lead Channel Setting Sensing Sensitivity: 2.8 mV
Zone Setting Status: 755011
Zone Setting Status: 755011

## 2023-08-23 NOTE — Patient Instructions (Signed)
Medication Instructions:  Continue all current medications.  Labwork: none  Testing/Procedures: none  Follow-Up: 1 year   Any Other Special Instructions Will Be Listed Below (If Applicable).  If you need a refill on your cardiac medications before your next appointment, please call your pharmacy.  

## 2023-08-23 NOTE — Progress Notes (Signed)
    PCP: Sherol Dade, DO Primary Cardiologist: Dr Diona Browner Primary EP:  Dr Trudi Ida is a 88 y.o. male who presents today for routine electrophysiology followup.  Since last being seen in our clinic, the patient reports doing very well.      Today, he denies symptoms of palpitations, chest pain, shortness of breath,  lower extremity edema, dizziness, presyncope, or syncope.  The patient is otherwise without complaint today.       Physical Exam: Vitals:   08/23/23 1037  BP: 122/70  Pulse: 86  SpO2: 93%  Weight: 264 lb (119.7 kg)  Height: 6\' 1"  (1.854 m)    Gen: Appears comfortable, well-nourished CV: RRR, 1+ dependent edema The device site is normal -- no tenderness, edema, drainage, redness, threatened erosion. Pulm: breathing easily   Pacemaker interrogation- reviewed in detail today,  See PACEART report  ekg tracing ordered today is personally reviewed and shows sinus, RBBB  Assessment and Plan:  1. Symptomatic sinus bradycardia   Normal pacemaker function See Pace Art report No changes today he is not device dependant today He has known RV lead noise.  Likely myopotention with unipolar sensing.  2. HTN Stable No change required today  3. Prior PTE S/p IVC filter Continue long term OAC    Return in a year  Maurice Small, MD 08/23/2023 11:02 AM

## 2024-08-21 ENCOUNTER — Ambulatory Visit: Payer: Medicare Other | Attending: Cardiovascular Disease | Admitting: Cardiovascular Disease

## 2024-08-21 ENCOUNTER — Encounter: Payer: Self-pay | Admitting: Cardiovascular Disease

## 2024-08-21 VITALS — BP 126/80 | HR 88 | Ht 73.0 in | Wt 243.4 lb

## 2024-08-21 DIAGNOSIS — I2699 Other pulmonary embolism without acute cor pulmonale: Secondary | ICD-10-CM | POA: Diagnosis present

## 2024-08-21 DIAGNOSIS — I495 Sick sinus syndrome: Secondary | ICD-10-CM | POA: Diagnosis present

## 2024-08-21 LAB — CUP PACEART INCLINIC DEVICE CHECK
Battery Impedance: 1143 Ohm
Battery Remaining Longevity: 63 mo
Battery Voltage: 2.76 V
Brady Statistic AP VP Percent: 3 %
Brady Statistic AP VS Percent: 9 %
Brady Statistic AS VP Percent: 6 %
Brady Statistic AS VS Percent: 83 %
Date Time Interrogation Session: 20251003150223
Implantable Lead Connection Status: 753985
Implantable Lead Connection Status: 753985
Implantable Lead Implant Date: 20060828
Implantable Lead Implant Date: 20060828
Implantable Lead Location: 753859
Implantable Lead Location: 753860
Implantable Lead Model: 5076
Implantable Lead Model: 5076
Implantable Pulse Generator Implant Date: 20160906
Lead Channel Impedance Value: 412 Ohm
Lead Channel Impedance Value: 650 Ohm
Lead Channel Pacing Threshold Amplitude: 1 V
Lead Channel Pacing Threshold Amplitude: 1.25 V
Lead Channel Pacing Threshold Amplitude: 1.25 V
Lead Channel Pacing Threshold Pulse Width: 0.4 ms
Lead Channel Pacing Threshold Pulse Width: 0.4 ms
Lead Channel Pacing Threshold Pulse Width: 0.4 ms
Lead Channel Sensing Intrinsic Amplitude: 2.8 mV
Lead Channel Sensing Intrinsic Amplitude: 8 mV
Lead Channel Setting Pacing Amplitude: 2 V
Lead Channel Setting Pacing Amplitude: 2.5 V
Lead Channel Setting Pacing Pulse Width: 0.4 ms
Lead Channel Setting Sensing Sensitivity: 2.8 mV
Zone Setting Status: 755011
Zone Setting Status: 755011

## 2024-08-21 NOTE — Progress Notes (Signed)
    PCP: Andi Jointer, DO Primary Cardiologist: Dr Debera Primary EP:  Dr Kelsie Lynwood [name] is a 89 y.o. male who presents today for routine electrophysiology followup.  Since last being seen in our clinic, the patient reports doing very well.      Today, he denies symptoms of palpitations, chest pain, shortness of breath,  lower extremity edema, dizziness, presyncope, or syncope.  The patient is otherwise without complaint today.       Physical Exam: Vitals:   08/21/24 1037  BP: 126/80  Pulse: 88  SpO2: 94%  Weight: 243 lb 6.4 oz (110.4 kg)  Height: 6' 1 (1.854 m)    Gen: Appears comfortable, well-nourished CV: RRR, 1+ dependent edema The device site is normal -- no tenderness, edema, drainage, redness, threatened erosion. Pulm: breathing easily   Pacemaker interrogation- reviewed in detail today,  See PACEART report  ekg tracing ordered today is personally reviewed and shows sinus, RBBB  Assessment and Plan:  1. Symptomatic sinus bradycardia   Normal pacemaker function See Pace Art report No changes today he is not device dependant today He has known RV lead noise.  Likely myopotention with unipolar sensing.  2. HTN Stable No change required today  3. Prior PTE S/p IVC filter Continue Eliquis 5 mg twice daily    Return in a year  Eulas FORBES Furbish, MD 08/21/2024 10:55 AM

## 2024-08-21 NOTE — Patient Instructions (Signed)

## 2024-08-26 ENCOUNTER — Telehealth: Payer: Self-pay

## 2024-08-26 NOTE — Telephone Encounter (Signed)
 Patient has missed recent remote scheduled appointments.  He has an Adapta and has to manually send.   Forwarding to [place] our remote specialist CMA to set new schedule (as he has no more remote appts) and go over his new appointment schedule with him.

## 2024-08-31 ENCOUNTER — Ambulatory Visit: Payer: Self-pay | Admitting: Cardiovascular Disease

## 2024-09-09 NOTE — Telephone Encounter (Signed)
 Unable to reach pt, remotes are scheduled already

## 2024-11-20 ENCOUNTER — Ambulatory Visit: Attending: Family Medicine

## 2024-11-20 DIAGNOSIS — I495 Sick sinus syndrome: Secondary | ICD-10-CM

## 2024-11-26 LAB — CUP PACEART REMOTE DEVICE CHECK
Battery Impedance: 1307 Ohm
Battery Remaining Longevity: 57 mo
Battery Voltage: 2.77 V
Brady Statistic AP VP Percent: 2 %
Brady Statistic AP VS Percent: 14 %
Brady Statistic AS VP Percent: 5 %
Brady Statistic AS VS Percent: 79 %
Date Time Interrogation Session: 20260107164031
Implantable Lead Connection Status: 753985
Implantable Lead Connection Status: 753985
Implantable Lead Implant Date: 20060828
Implantable Lead Implant Date: 20060828
Implantable Lead Location: 753859
Implantable Lead Location: 753860
Implantable Lead Model: 5076
Implantable Lead Model: 5076
Implantable Pulse Generator Implant Date: 20160906
Lead Channel Impedance Value: 391 Ohm
Lead Channel Impedance Value: 623 Ohm
Lead Channel Pacing Threshold Amplitude: 1 V
Lead Channel Pacing Threshold Amplitude: 1.375 V
Lead Channel Pacing Threshold Pulse Width: 0.4 ms
Lead Channel Pacing Threshold Pulse Width: 0.4 ms
Lead Channel Setting Pacing Amplitude: 2 V
Lead Channel Setting Pacing Amplitude: 2.5 V
Lead Channel Setting Pacing Pulse Width: 0.4 ms
Lead Channel Setting Sensing Sensitivity: 2.8 mV
Zone Setting Status: 755011
Zone Setting Status: 755011

## 2024-11-26 NOTE — Progress Notes (Signed)
 Remote PPM Transmission

## 2024-11-28 ENCOUNTER — Ambulatory Visit: Payer: Self-pay | Admitting: Cardiovascular Disease

## 2025-02-19 ENCOUNTER — Ambulatory Visit
